# Patient Record
Sex: Male | Born: 1945 | Race: White | Hispanic: No | Marital: Married | Smoking: Former smoker
Health system: Southern US, Community
[De-identification: ages and names within clinical notes are randomized; demographics above are authoritative.]

## PROBLEM LIST (undated history)

## (undated) DIAGNOSIS — I10 Essential (primary) hypertension: Secondary | ICD-10-CM

## (undated) DIAGNOSIS — E785 Hyperlipidemia, unspecified: Secondary | ICD-10-CM

## (undated) DIAGNOSIS — N200 Calculus of kidney: Secondary | ICD-10-CM

## (undated) DIAGNOSIS — G2581 Restless legs syndrome: Secondary | ICD-10-CM

## (undated) DIAGNOSIS — I714 Abdominal aortic aneurysm, without rupture, unspecified: Secondary | ICD-10-CM

## (undated) DIAGNOSIS — K759 Inflammatory liver disease, unspecified: Secondary | ICD-10-CM

## (undated) HISTORY — PX: TONSILLECTOMY: SUR1361

---

## 2002-02-02 ENCOUNTER — Encounter: Payer: Self-pay | Admitting: Family Medicine

## 2002-02-02 ENCOUNTER — Encounter: Admission: RE | Admit: 2002-02-02 | Discharge: 2002-02-02 | Payer: Self-pay | Admitting: Family Medicine

## 2003-12-25 ENCOUNTER — Ambulatory Visit (HOSPITAL_COMMUNITY): Admission: RE | Admit: 2003-12-25 | Discharge: 2003-12-25 | Payer: Self-pay | Admitting: Urology

## 2004-05-15 ENCOUNTER — Ambulatory Visit (HOSPITAL_BASED_OUTPATIENT_CLINIC_OR_DEPARTMENT_OTHER): Admission: RE | Admit: 2004-05-15 | Discharge: 2004-05-15 | Payer: Self-pay | Admitting: Urology

## 2004-05-30 ENCOUNTER — Ambulatory Visit (HOSPITAL_COMMUNITY): Admission: RE | Admit: 2004-05-30 | Discharge: 2004-05-30 | Payer: Self-pay | Admitting: Urology

## 2005-03-19 ENCOUNTER — Encounter: Admission: RE | Admit: 2005-03-19 | Discharge: 2005-03-19 | Payer: Self-pay | Admitting: Family Medicine

## 2005-04-10 ENCOUNTER — Encounter: Admission: RE | Admit: 2005-04-10 | Discharge: 2005-04-10 | Payer: Self-pay | Admitting: Family Medicine

## 2006-07-22 ENCOUNTER — Encounter (INDEPENDENT_AMBULATORY_CARE_PROVIDER_SITE_OTHER): Payer: Self-pay | Admitting: Specialist

## 2006-07-22 ENCOUNTER — Ambulatory Visit (HOSPITAL_BASED_OUTPATIENT_CLINIC_OR_DEPARTMENT_OTHER): Admission: RE | Admit: 2006-07-22 | Discharge: 2006-07-22 | Payer: Self-pay | Admitting: Surgery

## 2007-06-21 ENCOUNTER — Ambulatory Visit (HOSPITAL_COMMUNITY): Admission: RE | Admit: 2007-06-21 | Discharge: 2007-06-21 | Payer: Self-pay | Admitting: Urology

## 2007-07-14 ENCOUNTER — Ambulatory Visit (HOSPITAL_COMMUNITY): Admission: RE | Admit: 2007-07-14 | Discharge: 2007-07-14 | Payer: Self-pay | Admitting: Urology

## 2007-10-04 ENCOUNTER — Inpatient Hospital Stay (HOSPITAL_COMMUNITY): Admission: RE | Admit: 2007-10-04 | Discharge: 2007-10-05 | Payer: Self-pay | Admitting: Urology

## 2008-02-04 ENCOUNTER — Ambulatory Visit (HOSPITAL_COMMUNITY): Admission: RE | Admit: 2008-02-04 | Discharge: 2008-02-04 | Payer: Self-pay | Admitting: Urology

## 2009-04-13 ENCOUNTER — Encounter: Admission: RE | Admit: 2009-04-13 | Discharge: 2009-04-13 | Payer: Self-pay | Admitting: Family Medicine

## 2009-04-16 ENCOUNTER — Encounter: Admission: RE | Admit: 2009-04-16 | Discharge: 2009-04-16 | Payer: Self-pay | Admitting: Family Medicine

## 2009-06-27 ENCOUNTER — Encounter: Admission: RE | Admit: 2009-06-27 | Discharge: 2009-06-27 | Payer: Self-pay | Admitting: Family Medicine

## 2010-11-17 ENCOUNTER — Encounter: Payer: Self-pay | Admitting: Family Medicine

## 2011-02-25 HISTORY — PX: OTHER SURGICAL HISTORY: SHX169

## 2011-03-11 ENCOUNTER — Other Ambulatory Visit (HOSPITAL_COMMUNITY): Payer: Self-pay | Admitting: Orthopaedic Surgery

## 2011-03-11 ENCOUNTER — Encounter (HOSPITAL_COMMUNITY)
Admission: RE | Admit: 2011-03-11 | Discharge: 2011-03-11 | Disposition: A | Discharge status: Home / Self Care | Payer: 59 | Source: Ambulatory Visit | Attending: Orthopaedic Surgery | Admitting: Orthopaedic Surgery

## 2011-03-11 ENCOUNTER — Ambulatory Visit (HOSPITAL_COMMUNITY)
Admission: RE | Admit: 2011-03-11 | Discharge: 2011-03-11 | Disposition: A | Discharge status: Home / Self Care | Payer: 59 | Source: Ambulatory Visit | Attending: Orthopaedic Surgery | Admitting: Orthopaedic Surgery

## 2011-03-11 DIAGNOSIS — M4802 Spinal stenosis, cervical region: Secondary | ICD-10-CM

## 2011-03-11 DIAGNOSIS — Z01818 Encounter for other preprocedural examination: Secondary | ICD-10-CM | POA: Insufficient documentation

## 2011-03-11 DIAGNOSIS — Z01810 Encounter for preprocedural cardiovascular examination: Secondary | ICD-10-CM | POA: Insufficient documentation

## 2011-03-11 DIAGNOSIS — Z01812 Encounter for preprocedural laboratory examination: Secondary | ICD-10-CM | POA: Insufficient documentation

## 2011-03-11 LAB — URINALYSIS, ROUTINE W REFLEX MICROSCOPIC
Bilirubin Urine: NEGATIVE
Ketones, ur: NEGATIVE mg/dL
Nitrite: NEGATIVE
Protein, ur: NEGATIVE mg/dL
Specific Gravity, Urine: 1.009 (ref 1.005–1.030)
Urobilinogen, UA: 0.2 mg/dL (ref 0.0–1.0)
pH: 6.5 (ref 5.0–8.0)

## 2011-03-11 LAB — COMPREHENSIVE METABOLIC PANEL
BUN: 18 mg/dL (ref 6–23)
CO2: 30 mEq/L (ref 19–32)
Chloride: 105 mEq/L (ref 96–112)
GFR calc Af Amer: >60 mL/min (ref >60)
Potassium: 4.6 mEq/L (ref 3.5–5.1)
Sodium: 141 mEq/L (ref 135–145)
Total Protein: 6.3 g/dL (ref 6.0–8.3)

## 2011-03-11 LAB — SURGICAL PCR SCREEN: Staphylococcus aureus: NEGATIVE

## 2011-03-11 LAB — CBC
RBC: 4.44 MIL/uL (ref 4.22–5.81)
WBC: 8.4 10*3/uL (ref 4.0–10.5)

## 2011-03-11 NOTE — Op Note (Signed)
NAMELATRELL, POTEMPA               ACCOUNT NO.:  [id number]   MEDICAL RECORD NO.:  [id number]          PATIENT TYPE:  INP   LOCATION:  0007                         FACILITY:  [facility]   PHYSICIAN:  Heloise Purpura, MD      DATE OF BIRTH:  [birth year]   DATE OF PROCEDURE:  10/04/2007  DATE OF DISCHARGE:                               OPERATIVE REPORT   PREOPERATIVE DIAGNOSIS:  Left ureteral calculus.   POSTOPERATIVE DIAGNOSIS:  Left ureteral calculus.   PROCEDURE:  1. Cystoscopy.  2. Left retrograde pyelography.  3. Left ureteral stent placement (6 x 26)  4. Left robotic assisted laparoscopic ureterolithotomy.   SURGEON:  Heloise Purpura, MD   ASSISTANT:  Dr. Judeen Hammans.   ANESTHESIA:  General.   COMPLICATIONS:  None.   ESTIMATED BLOOD LOSS:  Minimal.   INTRAVENOUS FLUIDS:  2 liters of lactated Ringer's.   SPECIMEN:  Left ureteral calculus.   DISPOSITION OF SPECIMEN:  To [facility] for stone analysis.   INDICATIONS:  [name] is a 65 year old gentleman with a 1 cm left  ureteral calculus that has been refractory to treatment with shock wave  lithotripsy, and endoscopic ureteroscopic laser lithotripsy.  After  discussion regarding options for management of the stone, he did wish to  proceed with a ureterolithotomy, and preferred a minimally invasive  approach if feasible; therefore, the risks, potential complications, and  alternative options of the above procedures were discussed with the  patient in detail; and informed consent was obtained.   DESCRIPTION OF PROCEDURE:  The patient was taken to the operating room  and a general anesthetic was administered.  He was given preoperative  antibiotics, placed in the dorsal lithotomy position, and prepped and  draped in the usual sterile fashion.   Next a preoperative time-out was performed.  Cystourethroscopy was then  performed.  There was no evidence of any bladder tumors, stones, or  other mucosal pathology.  The left  ureteral orifice was identified, and  intubated with a 6-French ureteral catheter.  Contrast was injected  which demonstrated a normal caliber distal ureter.  There was noted to  be a large calcification in the proximal ureter which was easily  identified.  Contrast did easily pass by the stone up into the renal  pelvis, and there were no filling defects seen above the level of the  stone.   A 0.038 sensor guidewire was able to be advanced by the stone, and up  into the renal pelvis without difficulty.  A 6 x 26 double-J ureteral  stent was then advanced over the wire, past the stone without  difficulty, and appropriately positioned under fluoroscopic and  cystoscopic guidance.  The wire was then removed.  The patient's  abdominal wall was marked externally at the level of the stone with the  aid of fluoroscopic guidance.  The cystoscope was then removed and a 16-  [place] Foley catheter was placed.  The patient was then repositioned in  the left modified flank position.  A site was selected just superior to  the umbilicus for placement of the camera port.  This was placed using a  standard open Hassan technique allowing entry into the peritoneal cavity  under direct vision.  A 12 mm port was then placed and the 0-degree lens  was used to inspect the abdomen.  There was no evidence of any intra-  abdominal injuries or other abnormalities.  An 8 mm port was placed in  the left upper quadrant as well as the left lower quadrant for robotic  assistance.  A 12 mm port was placed more superiorly in the midline for  the laparoscopic assistance.   An additional 8 mm robotic port was then placed in the far left lower  quadrant.  The surgical cart was then docked.  With the aid of the  cautery scissors, the white line of Toldt was incised along the length  of the descending colon allowing the colon to be mobilized medially,  then the space between the anterior layer of Gerota's fascia, and the   colonic mesentery to be developed.  The ureter was identified, where it  crossed the iliac vessels and traced superiorly until the level of the  stone.  The ureter was isolated, at this point, with care to preserve  the periureteral blood supply.  An incision over the stone was made  longitudinally on the anterolateral surface of the ureter, and the stone  was identified.   The stone was then removed in its entirety with the stone grasping  forceps and sent for stone analysis.  The ureter was examined, and there  was no evidence of any remaining stone fragments.  The patient's  ureteral stent was identified, and noted to be in place.  A 4-0 Vicryl  running suture was then used to close the ureter in a tension-free  fashion.  A #15 Blake drain was then placed through the left lower  quadrant, robotic port, and secured with a nylon suture.  The surgical  cart was undocked.  The superior 12 mm port was closed with a #0 Vicryl  figure-of-eight suture placed with the aid of the suture passer device.   The camera port site was closed with a running #0 Vicryl suture.  All  ports had been removed under direct vision, and the pneumoperitoneum had  been expelled.  The port sites were then injected with 1/4% Marcaine  reapproximated at the skin level with 4-0 Monocryl in subcuticular  closures.  Steri-Strips were placed, and sterile dressings were applied.  The patient appeared to tolerate the procedure well, and without  complications.  He was able to be extubated and transferred to the  recovery unit in satisfactory condition.      Heloise Purpura, MD  Electronically Signed     LB/MEDQ  D:  10/04/2007  T:  10/04/2007  Job:  [id number]

## 2011-03-11 NOTE — Discharge Summary (Signed)
[name], [name]               ACCOUNT NO.:  [id number]   MEDICAL RECORD NO.:  [id number]          PATIENT TYPE:  INP   LOCATION:  1428                         FACILITY:  [facility]   PHYSICIAN:  Heloise Purpura, MD      DATE OF BIRTH:  12-16-1945   DATE OF ADMISSION:  10/04/2007  DATE OF DISCHARGE:  10/05/2007                               DISCHARGE SUMMARY   ADMISSION DIAGNOSIS:  Left ureteral calculus.   DISCHARGE DIAGNOSIS:  Left ureteral calculus.   HISTORY AND PHYSICAL:  For full details, please see admission History  and Physical.  Briefly, Randall [name] is a 65 year old gentleman with a 1  cm proximal left ureteral calculus that has been refractory to shock  wave lithotripsy an endoscopic treatment with ureteroscopy and laser  lithotripsy..  After discussing options, the patient elected to proceed  with a ureterolithotomy and did elect to have this performed in a  minimally invasive fashion if possible.   HOSPITAL COURSE:  On October 04, 2007, the patient was taken to the  operating room and underwent cystoscopy with ureteral stent placement  and a robotic assisted laparoscopic left ureterolithotomy.  He tolerated  the procedure well without complications and postoperatively was  transferred to a regular hospital room.  He was able to begin ambulating  the night of surgery and remained hemodynamically stable.  His  hemoglobin was found to be stable at 12.7 on postoperative day #1, and  his renal function remained stable with a creatinine of 1.2.  His Foley  catheter was removed, and his drain creatinine was checked and found to  be 1.2 consistent with his serum.  His drain was, therefore, removed.  He was able to tolerate a liquid diet and did have return of bowel  function and, therefore, was discharged home on postoperative day #1 in  excellent condition.   DISPOSITION:  Home.   DISCHARGE MEDICATIONS:  Randall [name] will be given a prescription to take  Vicodin as needed for  pain and has been instructed to use Colace as a  stool softener.   DISCHARGE INSTRUCTIONS:  He was instructed to be ambulatory but  specifically told to refrain from any heavy lifting, strenuous activity,  or driving.  He was given instructions to gradually advance his diet  over the course of the next few days.   FOLLOWUP PLANS:  Randall [name] will follow up in the next couple of weeks  for postoperative evaluation.      Heloise Purpura, MD  Electronically Signed     LB/MEDQ  D:  10/05/2007  T:  10/05/2007  Job:  [id number]

## 2011-03-11 NOTE — H&P (Signed)
Randall [name], Randall [name]               ACCOUNT NO.:  [id number]   MEDICAL RECORD NO.:  [id number]          PATIENT TYPE:  INP   LOCATION:  0007                         FACILITY:  [facility]   PHYSICIAN:  Heloise Purpura, MD      DATE OF BIRTH:  [birth year]   DATE OF ADMISSION:  10/04/2007  DATE OF DISCHARGE:                              HISTORY & PHYSICAL   CHIEF COMPLAINT:  Left ureteral calculus.   HISTORY OF PRESENT ILLNESS:  Randall [name] is a 65 year old gentleman with  a history of nephrolithiasis.  He has been followed by Dr. Vic Blackbird and was found to have a 1 cm proximal ureteral calculus which  has been refractory to shock wave lithotripsy and endoscopic treatment  by Dr. Aldean Ast.  Dr. Aldean Ast recently discussed the option of a  ureterolithotomy for Mr.  [name].  He was interested in undergoing this  definitive procedure.  It was therefore discussed whether this could be  performed to a minimally invasive approach, and it was felt to be  potentially amendable to a laparoscopic procedure.   In addition, Randall [name] has undergone a nuclear medicine renal scan  which did demonstrate 33% relative renal function of the left kidney  versus 67% on the right.  While he did have some delay, there was no  evidence of obstruction, after the administration of Lasix.  He is  currently unstented.  His recent urinalysis is negative for infection.   After discussing management options including an attempt at repeat  endoscopic treatment including a possible percutaneous approach, the  patient does wish to proceed with definitive management and is  interested in a minimally invasive ureterolithotomy.   PAST MEDICAL HISTORY:  Nephrolithiasis.   PAST SURGICAL HISTORY:  1. Left shock wave lithotripsy  2. Left ureteroscopic laser lithotripsy   MEDICATIONS:  Tylenol as needed.   ALLERGIES:  THE PATIENT HAS NO TRUE DRUG ALLERGIES BUT DOES HAVE AN  INTOLERANCE TO CODEINE WHICH CAUSES  NAUSEA.   FAMILY HISTORY:  No history of end-stage renal disease, urolithiasis, or  GU malignancy.   SOCIAL HISTORY:  The patient is married.  He has three children with two  sons and one daughter.  He is self-employed.  He has smoked one pack of  cigarettes for 25 years and drinks one glass of alcohol per day.   REVIEW OF SYSTEMS:  All systems are reviewed and are negative except for  a history of some easy bruising, constipation, dizziness, back pain and  joint pain.   PHYSICAL EXAMINATION:  CONSTITUTIONAL:  Well-nourished, well-developed,  age-appropriate male in no acute distress.  CARDIOVASCULAR:  Regular rate and rhythm without obvious murmurs.  LUNGS:  Clear bilaterally.  ABDOMEN:  Soft, nontender, nondistended without abdominal masses.  BACK:  No CVA tenderness.  EXTREMITIES:  No edema   IMPRESSION:  A 1 cm left proximal ureteral calculus resistant to  endoscopic treatment.   PLAN:  Randall [name] will undergo a left robotic assisted laparoscopic  ureterolithotomy.  He understands that there is the potential for open  surgical conversion.  He will be admitted to  the hospital for  postoperative care and informed consent has been obtained.      Heloise Purpura, MD  Electronically Signed     LB/MEDQ  D:  10/04/2007  T:  10/04/2007  Job:  [id number]

## 2011-03-12 ENCOUNTER — Inpatient Hospital Stay (HOSPITAL_COMMUNITY): Payer: 59

## 2011-03-12 ENCOUNTER — Observation Stay (HOSPITAL_COMMUNITY)
Admission: RE | Admit: 2011-03-12 | Discharge: 2011-03-13 | Disposition: A | Discharge status: Home / Self Care | Payer: 59 | Source: Ambulatory Visit | Attending: Orthopaedic Surgery | Admitting: Orthopaedic Surgery

## 2011-03-12 DIAGNOSIS — Z01818 Encounter for other preprocedural examination: Secondary | ICD-10-CM | POA: Insufficient documentation

## 2011-03-12 DIAGNOSIS — M4712 Other spondylosis with myelopathy, cervical region: Secondary | ICD-10-CM | POA: Insufficient documentation

## 2011-03-12 DIAGNOSIS — M5000 Cervical disc disorder with myelopathy, unspecified cervical region: Principal | ICD-10-CM | POA: Insufficient documentation

## 2011-03-12 DIAGNOSIS — Z01810 Encounter for preprocedural cardiovascular examination: Secondary | ICD-10-CM | POA: Insufficient documentation

## 2011-03-12 DIAGNOSIS — F17200 Nicotine dependence, unspecified, uncomplicated: Secondary | ICD-10-CM | POA: Insufficient documentation

## 2011-03-14 NOTE — Op Note (Signed)
NAME:  Randall [name], Randall [name]                         ACCOUNT NO.:  [id number]   MEDICAL RECORD NO.:  [id number]                   PATIENT TYPE:  AMB   LOCATION:  NESC                                 FACILITY:  [facility]   PHYSICIAN:  Rozanna Boer., M.D.      DATE OF BIRTH:  [birth year]   DATE OF PROCEDURE:  05/15/2004  DATE OF DISCHARGE:                                 OPERATIVE REPORT   PREOPERATIVE DIAGNOSES:  Obstructing distal and proximal ureteral stones.   POSTOPERATIVE DIAGNOSES:  Obstructing distal and proximal ureteral stones.   OPERATION:  Cystoretrograde pyelogram, ureteroscopy with holmium lasertripsy  and insertion of left ureteral stent.   ANESTHESIA:  General.   SURGEON:  Courtney Paris, M.D.   BRIEF HISTORY:  This 65 year old patient presented with a large stone in his  left proximal ureter that was treated with lithotripsy February 2005.  He  never came back and followup and went up to [place] to work.  Coming  back weeks ago, he had some left flank pain that got worse five days ago and  he had some fever three days ago.  He went to the ER where a CT scan showed  an obstruction by a nest of proximal ureteral stones and also a large stone  in the distal ureter between the junction of the middle and lower third. The  distal stone was about 8-10 mm in size.  There was a smaller stone in the  lower left kidney but it was just a fraction of the size that it was before  the lithotripsy in February.  He enters now for Cysto stent insertion and  possible ureteroscopy.   The patient was placed on the operating table in the dorsal lithotomy  position after satisfactory induction of general anesthesia. He was given  Cipro yesterday and again IV prior to the procedure.  The panendoscope was  inserted, his meatus was somewhat tight and he had no anterior strictures,  plus the urethra was nonobstructing. The bladder was entered. The bladder  had a few patchy areas  of erythema but no evidence of any tumors or other  mucosal lesions seen. The left orifice was catheterized with a 6 open ended  ureteral catheter and then an occlusive retrograde demonstrated the  obstructing stone in the distal left ureter and looks like stones in the  dilated upper ureter as well.  A guidewire was then passed beyond the stone  up to the level of the kidney.  The distal looked like it was accessible by  ureteroscopy so a 10 cm ureteral balloon dilator was then passed over the  guidewire and inflated to 12 atmospheres for 4 timed minutes. After this,  the short 6 ureteroscope was passed up to the stone where it was  photographed but it looked like it was too big to get a basket on it.  I  used the 325 micron holmium laser fibers and passed this  up along the  ureteroscope and under direct vision was able to break the stone. It had a  very hard outer cord but broke the stone into about three fairly large  pieces. I then get the scope beyond the stone.  I then passed a four wire  basket several times and removed all the pieces. I was able to get up to the  stones in the proximal ureter but I was at the extent of the ureteroscope  and I was afraid to try to basket or treat at this level. So I backed out  the ureteroscope and passed a 6 x 24 cm length double J ureteral stent over  the guidewire which was then removed with the coil in the renal pelvis and  also in the bladder in a good fashion. The bladder was drained, scope  removed and the patient taken  to the recovery room in good condition to be later discharged as an  outpatient.  We will see him next week, do a KUB and see if the stones in  the upper ureter are accessible by lithotripsy in which case they will be  treated at that time. The stones were sent for analysis.                                               Rozanna Boer., M.D.    HMK/MEDQ  D:  05/15/2004  T:  05/15/2004  Job:  [id number]

## 2011-03-27 NOTE — Op Note (Signed)
NAMEJASIAH, ELSEN               ACCOUNT NO.:  [id number]  MEDICAL RECORD NO.:  [id number]           PATIENT TYPE:  I  LOCATION:  5021                         FACILITY:  [facility]  PHYSICIAN:  Alayja Armas C. Ophelia Charter, M.D.    DATE OF BIRTH:  May 19, 1946  DATE OF PROCEDURE:  03/12/2011 DATE OF DISCHARGE:                              OPERATIVE REPORT   PREOPERATIVE DIAGNOSES:  Cervical spondylosis with herniated nucleus pulposus, severe stenosis, cord myelomalacia, and myelopathy.  PROCEDURES:  C4-C5 anterior cervical diskectomy and fusion and allograft.  C6 corpectomy.  C5-C6, C6-C7 diskectomy, decompression, herniated nucleus pulposus removal.  Tricortical allograft C5-C7 and anterior cervical plating from C4-C7.  Allograft bone, local autologous bone grafting from the C6 corpectomy.  (Three level fusion with C6 corpectomy.)  SURGEON:  Brenlynn Fake C. Ophelia Charter, MD  ASSISTANT:  Maud Deed, PA-C, medically necessary and present for the entire procedure. ESTIMATED BLOOD LOSS:  150 mL.  DRAINS:  One Hemovac, neck Aspen collar postop.  INDICATIONS:  This is a 65 year old male who has had several year history of progressive increased difficulty walking with back, knee, quad weakness, hip flexion weakness, and great difficulty climbing stairs.  There was triceps weakness and great quad weakness and MRI scan showed large HNP with 4-5 mm canal space with the left paracentral HNP at C5-C6 which had migrated back behind C6 vertebral body and a C6-C7 HNP with narrowing down to 6-7 mm with disk fragment migrating cephalad behind the C6 body where disk was basically present along the entire posterior aspect of C6.  C4-C5 had moderate spondylosis with uncovertebral spurring and moderate central narrowing.  PROCEDURE IN DETAIL:  After induction of general anesthesia and orotracheal intubation, the patient had preoperative Ancef prophylaxis and head halter traction was applied.  No traction was used.  Taping  of 4 x 4s over the ears, prepping with DuraPrep, the area squared with towels, sterile skin marker, Betadine Steri-Strips, sterile Mayo stand at the head, thyroid sheet and drapes were applied.  Time-out procedure was completed.  Warmer was on, leg pumpers for DVT prophylaxis. Informed consent had been obtained including the potential for increasing neurologic deficit due to the cord myelomalacia changes and already present motor deficit.  Incision was made transversely overlying the C5-C6 disk using a prominent skin fold and palpable landmarks, cricothyroid, carotid tubercle, and clavicle.  Platysma was divided in line with the fibers. Omohyoid was spared and dissection began first underneath the omohyoid and large spur was identified.  A #25 short needle placed with a straight clamp in the C5-C6 disk space and identified with cross-table lateral C-arm spot picture, correctly identifying the level.  Self- retaining Cloward retractors, teeth blades, right and left, smooth blades up and down, diskectomy was performed.  With #15 blade pituitaries and Cloward curettes, uncovertebral joints were stripped. Once the endplates were stripped, portion of the vertebrae resected with a Leksell rongeur and saved for later bone graft.  Operative microscope was draped and brought in, 3 mm of top portion of C6 was burred away all the way down to the posterior cortex and then microdissection techniques were used for  taking down the extremely thick posterior longitudinal ligament with the bone and disk material present.  Using some thrombin- soaked Gelfoam and FloSeal generic equivalent which was in a tube was used intermittently for hemostasis.  No epidural veins could be coagulated and once the bone was removed, the rest of the C6 was taken after doing microdiskectomy at C6-C7, stripping the uncovertebral joints.  Trials which were 12-13 mm were checked and continued bone removal until bone width was  more than 15 mm and the walls were smooth. Some chunks of the midportion of the cancellous bone of C6 were saved for later bone grafting and the endplate at C7 was burred and scraped. The disk fragments and posterior longitudinal ligament was scarred down to the dura on the left side and a black nerve hook was used for gentle blunt dissection freeing this up and then removing chunks of disk and ligament which was scarred down.  Dura was completely decompressed and rounded up into a nice tube.  Measurement was made with a ruler and then tricortical allograft piece was obtained that had been soaked for an hour.  With rehydration, it was cut with the oscillating saw, trimmed as needed with the bur and hand rongeur, and then custom fitted. Distraction was applied by the CRNA as this was gently tapped in place. It had to be adjusted when the top piece was tapped in at the cephalad portion.  The inferior portion of the graft wanted to the fall too far posteriorly and black nerve hook was used to pull it up as the top was gently tapped and fit securely.  There was no angulation of the graft. It was sitting in good position and was tight.  Next, blunt dissection above the omohyoid was performed.  Omohyoid was saved and no diskectomy was performed at C4-C5 after the Cloward self-retaining retractors were removed.  At this level, a 6-mm graft was planned.  The remaining portion of the allograft was cut, however, the bone was somewhat crumbly, did not maintain its shape as well and 3 separate pieces were cut each time it was being trimmed, some would tend to crack.  Cortical cancellous allograft was separately opened, 6-mm lordotic and then packed it into place after endplates had been curetted and the posterior longitudinal ligament had been completely been taken down for visualization of the dura at C4-C5 level.  There was less severe spurring and minimal disk protrusion compared to the other  levels.  This level only had some moderate stenosis.  There was room on each side for egress of bone.  The remaining bone was packed at the top and the bottom portion of the graft, some was tucked in underneath the plate and some was placed in the uncovertebral gutters but making sure that did not fall down posteriorly.  Pieces that were stuck in there were tight. Operative field was dry.  A Hemovac was placed with an in-and-out technique in-line with the skin incision.  Platysma closed with 3-0 Vicryl.  Omohyoid was normal.  4-0 Vicryl was used for subcuticular closure.  Tincture of benzoin, Steri-Strips, Marcaine infiltration, postop dressing, tape, and an Aspen collar.  Instrument count and needle count was correct.  Time-out procedure was completed at the end the case.  The patient was able to plantar flex and grip with both hands but was not awake and participated in exam until he was in the PACU recovery room.  He still had slight triceps weakness, slightly more on the  right than left but had good plantarflexion, good dorsiflexion, and could do a straight leg raise on both right and left feet.  No clonus was present, sensation was intact, and the patient in the PACU had no change in his neurologic exam from preoperative status.    Ramina Hulet C. Ophelia Charter, M.D.     MCY/MEDQ  D:  03/12/2011  T:  03/13/2011  Job:  [id number]  Electronically Signed by Annell Greening M.D. on 03/27/2011 06:28:06 PM

## 2011-03-28 ENCOUNTER — Other Ambulatory Visit: Payer: Self-pay | Admitting: Family Medicine

## 2011-03-28 DIAGNOSIS — R0989 Other specified symptoms and signs involving the circulatory and respiratory systems: Secondary | ICD-10-CM

## 2011-04-01 ENCOUNTER — Ambulatory Visit
Admission: RE | Admit: 2011-04-01 | Discharge: 2011-04-01 | Disposition: A | Discharge status: Home / Self Care | Payer: 59 | Source: Ambulatory Visit | Attending: Family Medicine | Admitting: Family Medicine

## 2011-04-01 DIAGNOSIS — R0989 Other specified symptoms and signs involving the circulatory and respiratory systems: Secondary | ICD-10-CM

## 2011-04-10 ENCOUNTER — Ambulatory Visit (HOSPITAL_BASED_OUTPATIENT_CLINIC_OR_DEPARTMENT_OTHER)
Admission: RE | Admit: 2011-04-10 | Discharge: 2011-04-10 | Disposition: A | Discharge status: Home / Self Care | Payer: 59 | Source: Ambulatory Visit | Attending: Orthopedic Surgery | Admitting: Orthopedic Surgery

## 2011-04-10 ENCOUNTER — Other Ambulatory Visit: Payer: Self-pay | Admitting: Orthopedic Surgery

## 2011-04-10 DIAGNOSIS — Z01812 Encounter for preprocedural laboratory examination: Secondary | ICD-10-CM | POA: Insufficient documentation

## 2011-04-10 DIAGNOSIS — L723 Sebaceous cyst: Secondary | ICD-10-CM | POA: Insufficient documentation

## 2011-04-15 NOTE — Op Note (Signed)
Randall [name], Randall [name]               ACCOUNT NO.:  [id number]  MEDICAL RECORD NO.:  [id number]  LOCATION:                                 FACILITY:  PHYSICIAN:  Katy Fitch. Ahleah Simko, M.D. DATE OF BIRTH:  April 08, 1946  DATE OF PROCEDURE:  04/10/2011 DATE OF DISCHARGE:                              OPERATIVE REPORT   PREOPERATIVE DIAGNOSIS:  Enlarging mass lateral aspect left proximal forearm adherent to the fascia of the extensor muscles.  POSTOPERATIVE DIAGNOSIS:  Probable epidermal inclusion cyst.  OPERATION:  Resection of subdermal (adherent to the fascia) mass left lateral forearm.  SURGEON:  Katy Fitch. Fraida Veldman, MD  ASSISTANT:  Annye Rusk, PA-C  ANESTHESIA:  General by LMA  SUPERVISING ANESTHESIOLOGIST:  Janetta Hora. Gelene Mink, MD  INDICATIONS:  Randall [name] is a 65 year old gentleman referred through the courtesy of Dr. Catha Gosselin of [facility].  Randall [name] had noted a gradually enlarging mass on the lateral aspect of his proximal left forearm.  This was nontender.  Clinical examination suggested an epidermal inclusion cyst.  He did not have a specific history of a puncture wound in this region that he could recall.  We advised him to proceed with excisional biopsy of this mass at this time for diagnosis with hopeful resolution of this predicament.  After informed consent, he is brought to the operating room at this time.  Preoperatively, he was interviewed by Dr. Gelene Mink.  Dr. Gelene Mink recommend general anesthesia by LMA.  Questions were invited and answered in detail.  PROCEDURE:  Randall [name] is brought to room 6 of the [facility] and placed in supine position upon the operating table.  Following the induction of general anesthesia by LMA technique under Dr. Thornton Dales direct supervision, the left arm was prepped with Betadine soap solution, sterilely draped.  A pneumatic tourniquet was applied to the proximal left  brachium.  Following a routine surgical time-out, the left arm was exsanguinated with an Esmarch bandage and the arterial tourniquet inflated to 220 mmHg.  Procedure commenced with a longitudinal incision directly over the mass. Subcutaneous tissues were meticulously divided taking care not to rupture the mass wall.  This was circumferentially dissected and electrocautery was used to obtain hemostasis from vessels that were adherent or passing through the mass.  With skin traction, a small portion of the cyst ruptured, however, no epidermal contents soaked the wound and there was no evidence of any epidermal cyst wall remaining.  After the mass was circumferentially excised, it was passed off in formalin for pathologic evaluation.  The wound was then thoroughly irrigated with sterile saline and a micro rongeur was used to clean the deep surface of the dermis in an effort to prevent recurrence.  The wound was then repaired in layers with subcutaneous 4-0 Vicryl and intradermal 3-0 Prolene with Steri-Strips and sterile dressing applied,sterile gauze and Tegaderm followed by an Ace wrap cover.  Randall [name] was advised to shower with the Ace bandage off and the Tegaderm in place.  We will see him back for followup in 1 week for suture removal and discussion of his pathologic results.  His wound was anesthetized with 2% lidocaine for postoperative analgesia.  Katy Fitch Nyoka Alcoser, M.D.     RVS/MEDQ  D:  04/10/2011  T:  04/10/2011  Job:  [id number]  cc:   Caryn Bee L. Little, M.D.  Electronically Signed by Josephine Igo M.D. on 04/15/2011 08:18:06 AM

## 2011-08-04 LAB — BASIC METABOLIC PANEL
BUN: 14
Calcium: 8.3 — ABNORMAL LOW
Calcium: 9.5
Chloride: 106
GFR calc Af Amer: >60
GFR calc Af Amer: >60
GFR calc non Af Amer: >60
GFR calc non Af Amer: >60
Glucose, Bld: 120 — ABNORMAL HIGH
Potassium: 4.1
Potassium: 4.4
Sodium: 139
Sodium: 139
Sodium: 141

## 2011-08-04 LAB — CREATININE, FLUID (PLEURAL, PERITONEAL, JP DRAINAGE): Creat, Fluid: 1.2

## 2011-08-04 LAB — URINALYSIS, ROUTINE W REFLEX MICROSCOPIC
Glucose, UA: NEGATIVE
Hgb urine dipstick: NEGATIVE
Protein, ur: NEGATIVE
pH: 6

## 2011-08-04 LAB — HEMOGLOBIN AND HEMATOCRIT, BLOOD
HCT: 37.2 — ABNORMAL LOW
HCT: 41.7
Hemoglobin: 12.7 — ABNORMAL LOW
Hemoglobin: 14.7

## 2011-08-04 LAB — TYPE AND SCREEN: Antibody Screen: NEGATIVE

## 2011-08-04 LAB — ABO/RH: ABO/RH(D): A POS

## 2011-08-04 LAB — URINE CULTURE
Colony Count: NO GROWTH
Special Requests: NEGATIVE

## 2011-08-04 LAB — CBC
Hemoglobin: 15.6
RBC: 4.65
RDW: 13.2

## 2011-08-07 LAB — URINALYSIS, ROUTINE W REFLEX MICROSCOPIC
Bilirubin Urine: NEGATIVE
Ketones, ur: NEGATIVE
Nitrite: NEGATIVE
Specific Gravity, Urine: 1.009
Urobilinogen, UA: 0.2
pH: 6.5

## 2011-08-07 LAB — CBC
MCHC: 34.7
Platelets: 283
RDW: 13.2

## 2011-08-07 LAB — BASIC METABOLIC PANEL
Calcium: 9.4
GFR calc non Af Amer: >60
Potassium: 4.9
Sodium: 141

## 2011-09-30 ENCOUNTER — Other Ambulatory Visit: Payer: Self-pay | Admitting: Orthopaedic Surgery

## 2011-09-30 ENCOUNTER — Ambulatory Visit
Admission: RE | Admit: 2011-09-30 | Discharge: 2011-09-30 | Disposition: A | Discharge status: Home / Self Care | Payer: 59 | Source: Ambulatory Visit | Attending: Orthopaedic Surgery | Admitting: Orthopaedic Surgery

## 2011-09-30 DIAGNOSIS — M542 Cervicalgia: Secondary | ICD-10-CM

## 2011-12-08 ENCOUNTER — Encounter (HOSPITAL_COMMUNITY): Payer: Self-pay | Admitting: Pharmacy Technician

## 2011-12-12 ENCOUNTER — Other Ambulatory Visit (HOSPITAL_COMMUNITY): Payer: Self-pay | Admitting: *Deleted

## 2011-12-12 ENCOUNTER — Encounter (HOSPITAL_COMMUNITY)
Admission: RE | Admit: 2011-12-12 | Discharge: 2011-12-12 | Disposition: A | Discharge status: Home / Self Care | Payer: BC Managed Care – PPO | Source: Ambulatory Visit | Attending: Orthopaedic Surgery | Admitting: Orthopaedic Surgery

## 2011-12-12 ENCOUNTER — Encounter (HOSPITAL_COMMUNITY): Payer: Self-pay

## 2011-12-12 ENCOUNTER — Other Ambulatory Visit (HOSPITAL_COMMUNITY): Payer: Self-pay | Admitting: Orthopaedic Surgery

## 2011-12-12 HISTORY — DX: Calculus of kidney: N20.0

## 2011-12-12 LAB — CBC
Hemoglobin: 14.7 g/dL (ref 13.0–17.0)
MCH: 33.3 pg (ref 26.0–34.0)
MCHC: 35.3 g/dL (ref 30.0–36.0)
Platelets: 277 10*3/uL (ref 150–400)
RBC: 4.42 MIL/uL (ref 4.22–5.81)

## 2011-12-12 LAB — SURGICAL PCR SCREEN
MRSA, PCR: NEGATIVE
Staphylococcus aureus: NEGATIVE

## 2011-12-12 NOTE — Pre-Procedure Instructions (Signed)
20 BURT PIATEK  12/12/2011   Your procedure is scheduled on:  Friday, February 22nd.  Report to [facility] at 5:30 AM.   Call this number if you have problems the morning of surgery: [phone]   Remember:   Do not eat food:After Midnight.  May have clear liquids: up to 4 Hours before arrival.  Clear liquids include soda, tea, black coffee, apple or grape juice, broth.  Take these medicines the morning of surgery with A SIP OF WATER: NA   Do not wear jewelry, make-up or nail polish.  Do not wear lotions, powders, or perfumes. You may wear deodorant.  Do not shave 48 hours prior to surgery.  Do not bring valuables to the hospital.  Contacts, dentures or bridgework may not be worn into surgery.  Leave suitcase in the car. After surgery it may be brought to your room.  For patients admitted to the hospital, checkout time is 11:00 AM the day of discharge.   Patients discharged the day of surgery will not be allowed to drive home.  Name and phone number of your driver: ---  Special Instructions: CHG Shower Use Special Wash: 1/2 bottle night before surgery and 1/2 bottle morning of surgery.   Please read over the following fact sheets that you were given: Pain Booklet, Coughing and Deep Breathing, MRSA Information and Surgical Site Infection Prevention

## 2011-12-18 MED ORDER — CEFAZOLIN SODIUM 1-5 GM-% IV SOLN
1.0000 g | INTRAVENOUS | Status: AC
  Administered 2011-12-19: 1 g via INTRAVENOUS
  Filled 2011-12-18: qty 50

## 2011-12-18 NOTE — H&P (Addendum)
  [facility]  A Division of [employer]  [address] [place], [place] [address] Telephone: [phone]  Fax: [phone]    PATIENT: Randall [name], Randall [name]  MR#: [id number]  Visit Date: 12/18/2011   Chief complaint: neck pain , pseudoarthrosis after 3 level ACDF by CT scan with motion Patient returns, he had previous 3-level cervical procedure 03/12/2011 with progressive myelopathy with quad weakness, hip flexor weakness, and inability to climb stairs.  His symptoms have been present for several years, gradually progressed.  Three-level procedure was performed.  Upper level appeared healed.  There is loosening at the lower level and CT scan was recommended.  We had discussed with him that with 3-level cervical fusion, as well as with the corpectomy, and long fibular set graft that at times the long grafts are more likely not to heal and a 2nd procedure may be necessary.  Plate was in good position, screws look good and on serial x-rays over the last few months there has been some reabsorption at the inferior aspect of the graft vertebral body junction.  This is consistent with likely pseudoarthrosis and patient has obtained a CT scan for review.     Family history, social history, and 14-point review of systems updated and unchanged.      PHYSICAL EXAMINATION:  He has had no change in his neurologic exam, has had improvement in his gait, improvement of lower extremities, and has been able to walk up stairs, and has improvement in quad strength, has not fallen since this procedure with gradual improvement in his lower extremity strength. Lucency at the C7 graft junction and some evidence of some slight loosening around the screws were reasons for obtaining CT scan. Good decompression at the planned levels. There is some evidence of loosening of C7 screws with pseudoarthrosis at the inferior aspect of the graft.    PLAN:  Posterior cervical fusion 3 level, the  C4-5 level is progressing, but is not completely healed, and plan will be posterior cervical fusion with iliac crest aspirate and Vitoss, posterior interspinous wiring.  Procedure was discussed, this was outlined well to him before the original procedure.  On flexion and extension there is motion at the bottom level where he has the loosening of the screws and he understands that with the long cervical anterior fusion at times that posterior cervical fusion is required if the anterior procedure does not heal.  All questions answered, he understands, and requests to proceed.     For additional information please see handwritten notes, reports, orders and prescriptions in this chart.      Sammantha Mehlhaff C. Ophelia Charter, M.D.    Auto-Authenticated by Veverly Fells. Ophelia Charter, M.D.  MCY/lp/js DD: 12/18/11 DT: 1

## 2011-12-19 ENCOUNTER — Ambulatory Visit (HOSPITAL_COMMUNITY)
Admission: RE | Admit: 2011-12-19 | Discharge: 2011-12-20 | Disposition: A | Discharge status: Home / Self Care | Payer: BC Managed Care – PPO | Source: Ambulatory Visit | Attending: Orthopaedic Surgery | Admitting: Orthopaedic Surgery

## 2011-12-19 ENCOUNTER — Encounter (HOSPITAL_COMMUNITY): Payer: Self-pay

## 2011-12-19 ENCOUNTER — Encounter (HOSPITAL_COMMUNITY)
Admission: RE | Disposition: A | Discharge status: Home / Self Care | Payer: Self-pay | Source: Ambulatory Visit | Attending: Orthopaedic Surgery

## 2011-12-19 ENCOUNTER — Ambulatory Visit (HOSPITAL_COMMUNITY): Payer: BC Managed Care – PPO | Admitting: Anesthesiology

## 2011-12-19 ENCOUNTER — Ambulatory Visit (HOSPITAL_COMMUNITY): Payer: BC Managed Care – PPO

## 2011-12-19 ENCOUNTER — Encounter (HOSPITAL_COMMUNITY): Payer: Self-pay | Admitting: Anesthesiology

## 2011-12-19 DIAGNOSIS — Y831 Surgical operation with implant of artificial internal device as the cause of abnormal reaction of the patient, or of later complication, without mention of misadventure at the time of the procedure: Secondary | ICD-10-CM | POA: Insufficient documentation

## 2011-12-19 DIAGNOSIS — F17200 Nicotine dependence, unspecified, uncomplicated: Secondary | ICD-10-CM | POA: Insufficient documentation

## 2011-12-19 DIAGNOSIS — Z981 Arthrodesis status: Secondary | ICD-10-CM

## 2011-12-19 DIAGNOSIS — T84498A Other mechanical complication of other internal orthopedic devices, implants and grafts, initial encounter: Secondary | ICD-10-CM | POA: Insufficient documentation

## 2011-12-19 DIAGNOSIS — M960 Pseudarthrosis after fusion or arthrodesis: Secondary | ICD-10-CM

## 2011-12-19 DIAGNOSIS — Z01812 Encounter for preprocedural laboratory examination: Secondary | ICD-10-CM | POA: Insufficient documentation

## 2011-12-19 HISTORY — PX: POSTERIOR CERVICAL FUSION/FORAMINOTOMY: SHX5038

## 2011-12-19 LAB — COMPREHENSIVE METABOLIC PANEL
Albumin: 3.8 g/dL (ref 3.5–5.2)
Alkaline Phosphatase: 78 U/L (ref 39–117)
BUN: 25 mg/dL — ABNORMAL HIGH (ref 6–23)
Calcium: 9.8 mg/dL (ref 8.4–10.5)
Creatinine, Ser: 1.02 mg/dL (ref 0.50–1.35)
GFR calc Af Amer: 88 mL/min — ABNORMAL LOW (ref >90)
Glucose, Bld: 109 mg/dL — ABNORMAL HIGH (ref 70–99)
Total Protein: 6.5 g/dL (ref 6.0–8.3)

## 2011-12-19 SURGERY — POSTERIOR CERVICAL FUSION/FORAMINOTOMY LEVEL 2
Anesthesia: General | Site: Neck | Wound class: Clean

## 2011-12-19 MED ORDER — OXYCODONE-ACETAMINOPHEN 5-325 MG PO TABS: ORAL_TABLET | ORAL | Status: DC

## 2011-12-19 MED ORDER — HEMOSTATIC AGENTS (NO CHARGE) OPTIME
TOPICAL | Status: DC | PRN
  Administered 2011-12-19: 1 via TOPICAL

## 2011-12-19 MED ORDER — METHOCARBAMOL 500 MG PO TABS: 500.0000 mg | ORAL_TABLET | Freq: Four times a day (QID) | ORAL | Status: AC

## 2011-12-19 MED ORDER — ALUM & MAG HYDROXIDE-SIMETH 200-200-20 MG/5ML PO SUSP: 30.0000 mL | Freq: Four times a day (QID) | ORAL | Status: DC | PRN

## 2011-12-19 MED ORDER — METHOCARBAMOL 500 MG PO TABS
500.0000 mg | ORAL_TABLET | Freq: Four times a day (QID) | ORAL | Status: DC | PRN
  Administered 2011-12-19: 500 mg via ORAL
  Filled 2011-12-19: qty 1

## 2011-12-19 MED ORDER — CEFAZOLIN SODIUM 1-5 GM-% IV SOLN
1.0000 g | Freq: Three times a day (TID) | INTRAVENOUS | Status: AC
  Administered 2011-12-19 (×2): 1 g via INTRAVENOUS
  Filled 2011-12-19 (×3): qty 50

## 2011-12-19 MED ORDER — HYDROCODONE-ACETAMINOPHEN 5-325 MG PO TABS
1.0000 | ORAL_TABLET | ORAL | Status: DC | PRN
  Administered 2011-12-19 – 2011-12-20 (×2): 2 via ORAL
  Filled 2011-12-19 (×2): qty 2

## 2011-12-19 MED ORDER — METHOCARBAMOL 100 MG/ML IJ SOLN
500.0000 mg | Freq: Four times a day (QID) | INTRAVENOUS | Status: DC | PRN
  Filled 2011-12-19: qty 5

## 2011-12-19 MED ORDER — HYDROMORPHONE HCL PF 1 MG/ML IJ SOLN
INTRAMUSCULAR | Status: AC
  Filled 2011-12-19: qty 1

## 2011-12-19 MED ORDER — LACTATED RINGERS IV SOLN
INTRAVENOUS | Status: DC | PRN
  Administered 2011-12-19 (×2): via INTRAVENOUS

## 2011-12-19 MED ORDER — BUPIVACAINE-EPINEPHRINE 0.5% -1:200000 IJ SOLN
INTRAMUSCULAR | Status: DC | PRN
  Administered 2011-12-19: 10 mL

## 2011-12-19 MED ORDER — ONDANSETRON HCL 4 MG/2ML IJ SOLN: 4.0000 mg | INTRAMUSCULAR | Status: DC | PRN

## 2011-12-19 MED ORDER — BISACODYL 10 MG RE SUPP: 10.0000 mg | Freq: Every day | RECTAL | Status: DC | PRN

## 2011-12-19 MED ORDER — GLYCOPYRROLATE 0.2 MG/ML IJ SOLN
INTRAMUSCULAR | Status: DC | PRN
  Administered 2011-12-19: .7 mg via INTRAVENOUS

## 2011-12-19 MED ORDER — FENTANYL CITRATE 0.05 MG/ML IJ SOLN
INTRAMUSCULAR | Status: DC | PRN
  Administered 2011-12-19 (×2): 100 ug via INTRAVENOUS
  Administered 2011-12-19: 50 ug via INTRAVENOUS

## 2011-12-19 MED ORDER — ONDANSETRON HCL 4 MG/2ML IJ SOLN: 4.0000 mg | Freq: Once | INTRAMUSCULAR | Status: DC | PRN

## 2011-12-19 MED ORDER — SODIUM CHLORIDE 0.9 % IJ SOLN: 3.0000 mL | Freq: Two times a day (BID) | INTRAMUSCULAR | Status: DC

## 2011-12-19 MED ORDER — KCL IN DEXTROSE-NACL 20-5-0.45 MEQ/L-%-% IV SOLN
INTRAVENOUS | Status: DC
  Administered 2011-12-19: 14:00:00 via INTRAVENOUS
  Filled 2011-12-19 (×3): qty 1000

## 2011-12-19 MED ORDER — ROCURONIUM BROMIDE 100 MG/10ML IV SOLN
INTRAVENOUS | Status: DC | PRN
  Administered 2011-12-19: 50 mg via INTRAVENOUS
  Administered 2011-12-19: 10 mg via INTRAVENOUS

## 2011-12-19 MED ORDER — ACETAMINOPHEN 650 MG RE SUPP: 650.0000 mg | RECTAL | Status: DC | PRN

## 2011-12-19 MED ORDER — HYDROMORPHONE HCL PF 1 MG/ML IJ SOLN
0.2500 mg | INTRAMUSCULAR | Status: DC | PRN
  Administered 2011-12-19 (×4): 0.5 mg via INTRAVENOUS

## 2011-12-19 MED ORDER — ACETAMINOPHEN 325 MG PO TABS: 650.0000 mg | ORAL_TABLET | ORAL | Status: DC | PRN

## 2011-12-19 MED ORDER — PANTOPRAZOLE SODIUM 40 MG IV SOLR
40.0000 mg | Freq: Every day | INTRAVENOUS | Status: DC
  Administered 2011-12-19: 40 mg via INTRAVENOUS
  Filled 2011-12-19 (×2): qty 40

## 2011-12-19 MED ORDER — PHENOL 1.4 % MT LIQD
1.0000 | OROMUCOSAL | Status: DC | PRN
  Filled 2011-12-19: qty 177

## 2011-12-19 MED ORDER — MENTHOL 3 MG MT LOZG: 1.0000 | LOZENGE | OROMUCOSAL | Status: DC | PRN

## 2011-12-19 MED ORDER — MORPHINE SULFATE 2 MG/ML IJ SOLN: 0.0500 mg/kg | INTRAMUSCULAR | Status: DC | PRN

## 2011-12-19 MED ORDER — LIDOCAINE HCL (CARDIAC) 20 MG/ML IV SOLN: INTRAVENOUS | Status: DC | PRN

## 2011-12-19 MED ORDER — SODIUM CHLORIDE 0.9 % IJ SOLN: 3.0000 mL | INTRAMUSCULAR | Status: DC | PRN

## 2011-12-19 MED ORDER — PROPOFOL 10 MG/ML IV EMUL
INTRAVENOUS | Status: DC | PRN
  Administered 2011-12-19: 120 mg via INTRAVENOUS

## 2011-12-19 MED ORDER — DOCUSATE SODIUM 100 MG PO CAPS
100.0000 mg | ORAL_CAPSULE | Freq: Two times a day (BID) | ORAL | Status: DC
  Administered 2011-12-19 (×2): 100 mg via ORAL
  Filled 2011-12-19 (×4): qty 1

## 2011-12-19 MED ORDER — EPHEDRINE SULFATE 50 MG/ML IJ SOLN
INTRAMUSCULAR | Status: DC | PRN
  Administered 2011-12-19 (×2): 10 mg via INTRAVENOUS

## 2011-12-19 MED ORDER — MIDAZOLAM HCL 5 MG/5ML IJ SOLN
INTRAMUSCULAR | Status: DC | PRN
  Administered 2011-12-19: 2 mg via INTRAVENOUS

## 2011-12-19 MED ORDER — KETOROLAC TROMETHAMINE 30 MG/ML IJ SOLN
30.0000 mg | Freq: Once | INTRAMUSCULAR | Status: AC
  Administered 2011-12-19: 30 mg via INTRAVENOUS

## 2011-12-19 MED ORDER — HYDROMORPHONE HCL PF 1 MG/ML IJ SOLN
0.5000 mg | INTRAMUSCULAR | Status: DC | PRN
  Administered 2011-12-19 (×3): 1 mg via INTRAVENOUS
  Filled 2011-12-19 (×2): qty 1

## 2011-12-19 MED ORDER — OXYCODONE-ACETAMINOPHEN 5-325 MG PO TABS: 1.0000 | ORAL_TABLET | ORAL | Status: DC | PRN

## 2011-12-19 MED ORDER — SENNOSIDES-DOCUSATE SODIUM 8.6-50 MG PO TABS: 1.0000 | ORAL_TABLET | Freq: Every evening | ORAL | Status: DC | PRN

## 2011-12-19 MED ORDER — ONDANSETRON HCL 4 MG/2ML IJ SOLN
INTRAMUSCULAR | Status: DC | PRN
  Administered 2011-12-19 (×2): 4 mg via INTRAVENOUS

## 2011-12-19 MED ORDER — ASPIRIN EC 325 MG PO TBEC
325.0000 mg | DELAYED_RELEASE_TABLET | Freq: Every day | ORAL | Status: DC
  Filled 2011-12-19 (×2): qty 1

## 2011-12-19 MED ORDER — 0.9 % SODIUM CHLORIDE (POUR BTL) OPTIME
TOPICAL | Status: DC | PRN
  Administered 2011-12-19: 1000 mL

## 2011-12-19 MED ORDER — SODIUM CHLORIDE 0.9 % IV SOLN: 250.0000 mL | INTRAVENOUS | Status: DC

## 2011-12-19 MED ORDER — MEPERIDINE HCL 25 MG/ML IJ SOLN: 6.2500 mg | INTRAMUSCULAR | Status: DC | PRN

## 2011-12-19 MED ORDER — NEOSTIGMINE METHYLSULFATE 1 MG/ML IJ SOLN
INTRAMUSCULAR | Status: DC | PRN
  Administered 2011-12-19: 4 mg via INTRAVENOUS

## 2011-12-19 MED ORDER — ZOLPIDEM TARTRATE 10 MG PO TABS: 10.0000 mg | ORAL_TABLET | Freq: Every evening | ORAL | Status: DC | PRN

## 2011-12-19 SURGICAL SUPPLY — 57 items
24G wire ×2 IMPLANT
BENZOIN TINCTURE PRP APPL 2/3 (GAUZE/BANDAGES/DRESSINGS) IMPLANT
BLADE SURG 15 STRL LF DISP TIS (BLADE) ×1 IMPLANT
BLADE SURG 15 STRL SS (BLADE) ×1
BLADE SURG ROTATE 9660 (MISCELLANEOUS) IMPLANT
BUR ROUND FLUTED 4 SOFT TCH (BURR) ×2 IMPLANT
CLOTH BEACON ORANGE TIMEOUT ST (SAFETY) ×2 IMPLANT
CORDS BIPOLAR (ELECTRODE) ×2 IMPLANT
COVER SURGICAL LIGHT HANDLE (MISCELLANEOUS) ×2 IMPLANT
DRAPE PROXIMA HALF (DRAPES) ×2 IMPLANT
DRAPE SURG 17X23 STRL (DRAPES) ×10 IMPLANT
DRSG MEPILEX BORDER 4X4 (GAUZE/BANDAGES/DRESSINGS) ×2 IMPLANT
DURAPREP 26ML APPLICATOR (WOUND CARE) ×2 IMPLANT
ELECT REM PT RETURN 9FT ADLT (ELECTROSURGICAL) ×2
ELECTRODE REM PT RTRN 9FT ADLT (ELECTROSURGICAL) ×1 IMPLANT
EVACUATOR 1/8 PVC DRAIN (DRAIN) IMPLANT
GAUZE SPONGE 4X4 12PLY STRL LF (GAUZE/BANDAGES/DRESSINGS) ×2 IMPLANT
GAUZE XEROFORM 5X9 LF (GAUZE/BANDAGES/DRESSINGS) ×2 IMPLANT
GLOVE BIOGEL PI IND STRL 7.5 (GLOVE) ×1 IMPLANT
GLOVE BIOGEL PI IND STRL 8 (GLOVE) ×1 IMPLANT
GLOVE BIOGEL PI INDICATOR 7.5 (GLOVE) ×1
GLOVE BIOGEL PI INDICATOR 8 (GLOVE) ×1
GLOVE ECLIPSE 7.0 STRL STRAW (GLOVE) ×4 IMPLANT
GLOVE ORTHO TXT STRL SZ7.5 (GLOVE) ×2 IMPLANT
GOWN PREVENTION PLUS LG XLONG (DISPOSABLE) IMPLANT
GOWN STRL NON-REIN LRG LVL3 (GOWN DISPOSABLE) ×8 IMPLANT
KIT BASIN OR (CUSTOM PROCEDURE TRAY) ×2 IMPLANT
KIT ROOM TURNOVER OR (KITS) ×2 IMPLANT
MANIFOLD NEPTUNE II (INSTRUMENTS) ×2 IMPLANT
NEEDLE 1/2 CIR MAYO (NEEDLE) IMPLANT
NEEDLE ASP BONE MRW 11GX15 J (NEEDLE) ×2 IMPLANT
NEEDLE BONE MARROW 8GX6 FENEST (NEEDLE) ×2 IMPLANT
NS IRRIG 1000ML POUR BTL (IV SOLUTION) ×2 IMPLANT
PACK ORTHO CERVICAL (CUSTOM PROCEDURE TRAY) ×2 IMPLANT
PAD ARMBOARD 7.5X6 YLW CONV (MISCELLANEOUS) ×4 IMPLANT
SPONGE GAUZE 4X4 12PLY (GAUZE/BANDAGES/DRESSINGS) ×2 IMPLANT
SPONGE LAP 4X18 X RAY DECT (DISPOSABLE) ×6 IMPLANT
SPONGE SURGIFOAM ABS GEL 100 (HEMOSTASIS) IMPLANT
STAPLER VISISTAT 35W (STAPLE) IMPLANT
STRIP CLOSURE SKIN 1/2X4 (GAUZE/BANDAGES/DRESSINGS) IMPLANT
STRIP VITOSS 25X100X4MM (Neuro Prosthesis/Implant) ×2 IMPLANT
SUT STEEL 1 (SUTURE) IMPLANT
SUT STEEL 2 (SUTURE) IMPLANT
SUT VIC AB 0 CT1 27 (SUTURE) ×1
SUT VIC AB 0 CT1 27XBRD ANBCTR (SUTURE) ×1 IMPLANT
SUT VIC AB 2-0 CT1 27 (SUTURE) ×2
SUT VIC AB 2-0 CT1 TAPERPNT 27 (SUTURE) ×2 IMPLANT
SUT VIC AB 3-0 X1 27 (SUTURE) IMPLANT
SUT VICRYL 0 TIES 12 18 (SUTURE) ×2 IMPLANT
SUT VICRYL 4-0 PS2 18IN ABS (SUTURE) IMPLANT
SUT VICRYL AB 2 0 TIES (SUTURE) ×2 IMPLANT
TAPE CLOTH SURG 6X10 WHT LF (GAUZE/BANDAGES/DRESSINGS) ×2 IMPLANT
TOWEL OR 17X24 6PK STRL BLUE (TOWEL DISPOSABLE) ×2 IMPLANT
TOWEL OR 17X26 10 PK STRL BLUE (TOWEL DISPOSABLE) ×2 IMPLANT
TRAY FOLEY CATH 14FR (SET/KITS/TRAYS/PACK) IMPLANT
WATER STERILE IRR 1000ML POUR (IV SOLUTION) ×2 IMPLANT
YANKAUER SUCT BULB TIP NO VENT (SUCTIONS) ×2 IMPLANT

## 2011-12-19 NOTE — Brief Op Note (Cosign Needed)
12/19/2011  10:12 AM  PATIENT:  Randall [name]  66 y.o. 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  PROCEDURE:  Procedure(s) (LRB): POSTERIOR CERVICAL FUSION/ LEVEL 2 (N/A) Right iliac crest bone marrow aspirate VITOSS graft placed at fusion sites of C4-5 and C6-7  SURGEON:  Surgeon(s) and Role:    * Eldred Manges, MD - Primary  PHYSICIAN ASSISTANT: Nhia Heaphy PAC  ASSISTANTS: none   ANESTHESIA:   general  EBL:  Total I/O In: 1600 [I.V.:1600] Out: 700 [Urine:325; Blood:375]  BLOOD ADMINISTERED:none  DRAINS: Penrose drain in the POSTERIOR NECK   LOCAL MEDICATIONS USED:  MARCAINE     SPECIMEN:  No Specimen  DISPOSITION OF SPECIMEN:  N/A  COUNTS:  YES  TOURNIQUET:  * No tourniquets in log *  DICTATION: .Note written in EPIC  PLAN OF CARE: Admit for overnight observation  PATIENT DISPOSITION:  PACU - hemodynamically stable.   Delay start of Pharmacological VTE agent (>24hrs) due to surgical blood loss or risk of bleeding: yes

## 2011-12-19 NOTE — Op Note (Signed)
NAMEELIAH, Randall [name]               ACCOUNT NO.:  [id number]  MEDICAL RECORD NO.:  [id number]  LOCATION:  [facility]                         FACILITY:  [facility]  PHYSICIAN:  Canisha Issac C. Ophelia Charter, M.D.    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  POSTOPERATIVE DIAGNOSIS:  C4-5, C6-7 pseudoarthrosis status post 3 level cervical fusion for cervical myelopathy with cord compression.  PROCEDURE:  C4-5, C6-7 posterior cervical fusion with wiring iliac aspirate bone marrow and Vitoss.  SURGEON:  Adaya Garmany C. Ophelia Charter, MD.  ASSISTANT:  Maud Deed, [facility], medically necessary and present for the entire procedure.  EBL:  300 mL.  COMPLICATIONS:  None.  This is a 66 year old male had surgery 7 months ago when he presented with the progressive myelopathic gait, leg weakness, falling, inability to ambulate up stairs with lower extremity progressive weakness, and hyperreflexia.  MRI scan showed severe cord compression with cord changes consistent with myelopathy at the C5-6 level due to the hard and soft disk over longer segment.  Corpectomy was performed, and he had a long graft placed from C5-C7, and then the single allograft plate placed anteriorly at the C4-5 level and plate application 3 levels anteriorly. We did discuss with him extensively that he may require second operation when three-level fusion is being performed along allograft bones are being used to try to get this to heal that he may require later procedure.  We discussed with him before the first procedure back in May another option had been to immediately come back couple of months later as a secondary procedure in the posterior fusion but in many cases, even with 3 level fusions, the fusion may heal anteriorly and the  second procedure may not be necessary.  Unfortunately he has continued to have pain in his neck, he has been working his lower extremity weakness has been significantly improved.  He has been able to walk up steps not using any walking aids.  Does not use a cane anymore and has had improvement in his upper extremity strength.  X-rays have showed progressive loosening at the bottom screw at C7 and resorption at the allograft C7 vertebral body interspace consistent with progressive pseudoarthrosis.  He had no broken hardware but the C4-5 level also showed poor incorporation.  He is a smoker and CT scan preoperatively showed lucency on CT scan fine cuts both planes completely through both levels consistent with pseudoarthrosis.  He had good healing at the C5-6 level.  PROCEDURE:  After induction of general anesthesia, orotracheal intubation, a Foley catheter placement with the potential for possible increased blood loss with poster cervical fusion.  The patient placed prone on chest rolls.  Horseshoe head holder.  Careful positioning and checking the eyes.  Arms were padded at the side yellow pads over the ulnar nerve.  Some tape was applied to the skin to help pull the down the posterior cervical skin folds area squared 10/15 drapes, iliac crest on the right was left open and triangled off  with three 10/10 drapes in both the poster cervical and right posterior iliac was prepped with DuraPrep.  Sterile Mayo stand at the head, the area squared with towels, sterile skin marker, Betadine, Steri-Drape both areas and then cervical laminectomy sheet or thyroid sheet was used.  Half sheet at the top, time-out procedure was completed.  Preoperative antibiotics were given. Midline incision was made extending from palpable landmarks from C3 to C7-T1 spinous process interspace subperiosteal dissection on the lamina. There was bleeding noted.  Combination of the Bovie, bipolar, thrombin- soaked  Gelfoam packing and repeating process with careful hemostasis was performed until the operative field was dry.  Checking with a Kocher clamps, there was motion at C6-7 as expected.  No motion at C5-6 at the C4-5 level.  There was motion which was about 2 mm, and C3-4 moved in a normal fashion multiple millimeters.  Kocher clamp was placed between the C6-C7 spinous process, one between C5 and C6.  Initial x-ray showed the shoulders on the way, repeat films taken with Allis clamp plate placed on C4 spinous process.  Arm was pulled down and this confirmed appropriate levels and the plate anteriorly was visualized as well.  A 24-gauge wire was twisted into a cable using 3 strands, wet sponge was used running along this was pulled in tension to keep the twisted wire smoothing and uniform and was cut.  Then right-angle clamp was used, and it was passed around the top of C4 after making a small notch and then passed underneath C5 tightened down on the right side using the wire twister twisting it down securely with a Cobb retractor placed to keep the wire and a small notch had been made in the spinous process at the base of C4 so it would not slide off.  Tightened down securely, would not move.  It was tested with Kocher and there was no motion at this level, wire was cut.  Identical procedure was repeated at the C6-7 level passing the wire underneath the C7 then around the top of the C6.  This was again tightened down securely.  There was no motion after tightening and it was cut and then another x-ray was taken for confirmation. Retractor self-retaining was not removed but the wire was clearly seen in C4-5 and the wire could also be seen down at C6-7 not quite as clearly due to the self-retaining tractor.  Archer Asa still being in position.  The lamina had then been exposed, right and left sides were repaired with 4-mm bur browsing up the surface for good bleeding surface.  Bone marrow  aspirate 10 cc__________ was obtained from the right posterior iliac using the Ortho Vitoss aspiration kit.  Cortex was entered with a sharp, dull was switched and then pushed and tapped down with a hammer and then 10 mL of blood was aspirated as it was pulled back advancing and backing up to maximize the amount of osteoprogenitor cells that are desirable for the bone fusion.  Cement was removed sponges placed and at this point, the wire passing and burrowing of the bone was performed as listed above.  This gave the bone time to soak into the Vitoss strip left in the package and was cut into 4 pieces and each one of the pieces was packed either on the right or the left at the C4-5 level and then also down to the C6-7 level, down to the bleeding bone prepared laminal surface.  Deep fascia was then closed with interrupted  0 Vicryl.  A Hemovac was placed.  Subcutaneous tissue was reapproximated and then skin closure postop dressing.  Tincture of benzoin, Steri-Strips, Marcaine infiltration, postop dressing, soft cervical collar. Instrument count, needle count was correct.     Adreanna Fickel C. Ophelia Charter, M.D.     MCY/MEDQ  D:  12/19/2011  T:  12/19/2011  Job:  [id number]

## 2011-12-19 NOTE — Transfer of Care (Signed)
Immediate Anesthesia Transfer of Care Note  Patient: Randall [name]  Procedure(s) Performed: Procedure(s) (LRB): POSTERIOR CERVICAL FUSION/FORAMINOTOMY LEVEL 2 (N/A)  Patient Location: PACU  Anesthesia Type: General  Level of Consciousness: awake, alert , oriented and sedated  Airway & Oxygen Therapy: Patient Spontanous Breathing and Patient connected to nasal cannula oxygen  Post-op Assessment: Report given to PACU RN, Post -op Vital signs reviewed and stable and Patient moving all extremities  Post vital signs: Reviewed and stable  Complications: No apparent anesthesia complications

## 2011-12-19 NOTE — Progress Notes (Addendum)
Patient ID: Randall [name], male   DOB: 03-29-1946, 66 y.o.   MRN: [id number]  Instructions and RX for oxycodone and robaxin on chart.  Drain to be removed in am 12-20-11 by rounding MD.  New dressing apply to neck wound and posterior hip stab incision.  Pt will be discharged home if stable.  OV 2weeks

## 2011-12-19 NOTE — Interval H&P Note (Signed)
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 After consideration of risks, benefits and other options for treatment, the patient has consented to  Procedure(s) (LRB): POSTERIOR CERVICAL FUSION/FORAMINOTOMY LEVEL 2 (N/A) as a surgical intervention .  The patients' history has been reviewed, patient examined, no change in status, stable for surgery.  I have reviewed the patients' chart and labs.  Questions were answered to the patient's satisfaction.     [facility]  Surgery planned discussed yesterday in office visit with patient, plan of procedure discussed, ?'s answered. CT and new plain xrays reviewed. He requests we proceed as  Planned.

## 2011-12-19 NOTE — Preoperative (Signed)
Beta Blockers   Reason not to administer Beta Blockers:Not Applicable 

## 2011-12-19 NOTE — Anesthesia Postprocedure Evaluation (Signed)
Anesthesia Post Note  Patient: Randall [name]  Procedure(s) Performed: Procedure(s) (LRB): POSTERIOR CERVICAL FUSION/FORAMINOTOMY LEVEL 2 (N/A)  Anesthesia type: general  Patient location: PACU  Post pain: Pain level controlled  Post assessment: Patient's Cardiovascular Status Stable  Last Vitals:  Filed Vitals:   12/19/11 1021  BP: 147/80  Pulse:   Temp: 36.6 C  Resp:     Post vital signs: Reviewed and stable  Level of consciousness: sedated  Complications: No apparent anesthesia complications

## 2011-12-19 NOTE — Anesthesia Preprocedure Evaluation (Addendum)
Anesthesia Evaluation  Patient identified by MRN, date of birth, ID band Patient awake    Reviewed: Allergy & Precautions, H&P , NPO status , Patient's Chart, lab work & pertinent test results, reviewed documented beta blocker date and time   History of Anesthesia Complications Negative for: history of anesthetic complications  Airway Mallampati: III TM Distance: >3 FB Neck ROM: Full    Dental   Pulmonary Current Smoker,          Cardiovascular neg cardio ROS     Neuro/Psych    GI/Hepatic negative GI ROS, Neg liver ROS,   Endo/Other  Negative Endocrine ROS  Renal/GU negative Renal ROS  Genitourinary negative   Musculoskeletal   Abdominal   Peds  Hematology negative hematology ROS (+)   Anesthesia Other Findings   Reproductive/Obstetrics                          Anesthesia Physical Anesthesia Plan  ASA: II  Anesthesia Plan: General   Post-op Pain Management:    Induction: Intravenous  Airway Management Planned: Oral ETT  Additional Equipment:   Intra-op Plan:   Post-operative Plan: Extubation in OR  Informed Consent: I have reviewed the patients History and Physical, chart, labs and discussed the procedure including the risks, benefits and alternatives for the proposed anesthesia with the patient or authorized representative who has indicated his/her understanding and acceptance.     Plan Discussed with: CRNA and Surgeon  Anesthesia Plan Comments:         Anesthesia Quick Evaluation

## 2011-12-19 NOTE — Plan of Care (Signed)
Problem: Consults Goal: Diagnosis - Spinal Surgery Cervical Spine Fusion     

## 2011-12-20 NOTE — Progress Notes (Signed)
Pt doing well Walking Hv dced Dc to home today

## 2011-12-20 NOTE — Progress Notes (Signed)
Patient ID: Randall [name], male   DOB: [birth year], 66 y.o.   MRN: [id number] Pt. Discharged 12/20/2011  8:11 AM Discharge instructions reviewed with patient/family. Patient/family verbalized understanding. All Rx's given. Questions answered as needed. Pt. Discharged to home with family/self. Taken off unit via W/C. Lurline Idol [facility]

## 2011-12-25 ENCOUNTER — Encounter (HOSPITAL_COMMUNITY): Payer: Self-pay | Admitting: Orthopaedic Surgery

## 2013-02-03 DIAGNOSIS — R41840 Attention and concentration deficit: Secondary | ICD-10-CM | POA: Diagnosis not present

## 2013-02-08 DIAGNOSIS — M545 Low back pain, unspecified: Secondary | ICD-10-CM | POA: Diagnosis not present

## 2013-02-08 DIAGNOSIS — M47817 Spondylosis without myelopathy or radiculopathy, lumbosacral region: Secondary | ICD-10-CM | POA: Diagnosis not present

## 2013-02-21 DIAGNOSIS — IMO0002 Reserved for concepts with insufficient information to code with codable children: Secondary | ICD-10-CM | POA: Diagnosis not present

## 2013-02-21 DIAGNOSIS — M545 Low back pain, unspecified: Secondary | ICD-10-CM | POA: Diagnosis not present

## 2013-06-09 DIAGNOSIS — N400 Enlarged prostate without lower urinary tract symptoms: Secondary | ICD-10-CM | POA: Diagnosis not present

## 2013-06-09 DIAGNOSIS — N529 Male erectile dysfunction, unspecified: Secondary | ICD-10-CM | POA: Diagnosis not present

## 2013-06-09 DIAGNOSIS — R39198 Other difficulties with micturition: Secondary | ICD-10-CM | POA: Diagnosis not present

## 2013-06-09 DIAGNOSIS — R3913 Splitting of urinary stream: Secondary | ICD-10-CM | POA: Diagnosis not present

## 2013-07-22 DIAGNOSIS — N400 Enlarged prostate without lower urinary tract symptoms: Secondary | ICD-10-CM | POA: Diagnosis not present

## 2013-07-22 DIAGNOSIS — N529 Male erectile dysfunction, unspecified: Secondary | ICD-10-CM | POA: Diagnosis not present

## 2013-07-22 DIAGNOSIS — R39198 Other difficulties with micturition: Secondary | ICD-10-CM | POA: Diagnosis not present

## 2013-10-14 DIAGNOSIS — M25569 Pain in unspecified knee: Secondary | ICD-10-CM | POA: Diagnosis not present

## 2013-10-18 ENCOUNTER — Other Ambulatory Visit: Payer: Self-pay | Admitting: Family Medicine

## 2013-10-18 DIAGNOSIS — H60509 Unspecified acute noninfective otitis externa, unspecified ear: Secondary | ICD-10-CM | POA: Diagnosis not present

## 2013-10-18 DIAGNOSIS — Z125 Encounter for screening for malignant neoplasm of prostate: Secondary | ICD-10-CM | POA: Diagnosis not present

## 2013-10-18 DIAGNOSIS — I6529 Occlusion and stenosis of unspecified carotid artery: Secondary | ICD-10-CM | POA: Diagnosis not present

## 2013-10-18 DIAGNOSIS — G2581 Restless legs syndrome: Secondary | ICD-10-CM | POA: Diagnosis not present

## 2013-10-18 DIAGNOSIS — I771 Stricture of artery: Secondary | ICD-10-CM

## 2013-10-28 ENCOUNTER — Ambulatory Visit
Admission: RE | Admit: 2013-10-28 | Discharge: 2013-10-28 | Disposition: A | Discharge status: Home / Self Care | Payer: Medicare Other | Source: Ambulatory Visit | Attending: Family Medicine | Admitting: Family Medicine

## 2013-10-28 DIAGNOSIS — I658 Occlusion and stenosis of other precerebral arteries: Secondary | ICD-10-CM | POA: Diagnosis not present

## 2013-10-28 DIAGNOSIS — I771 Stricture of artery: Secondary | ICD-10-CM

## 2013-11-18 DIAGNOSIS — Z23 Encounter for immunization: Secondary | ICD-10-CM | POA: Diagnosis not present

## 2013-11-18 DIAGNOSIS — G2589 Other specified extrapyramidal and movement disorders: Secondary | ICD-10-CM | POA: Diagnosis not present

## 2014-01-13 DIAGNOSIS — R39198 Other difficulties with micturition: Secondary | ICD-10-CM | POA: Diagnosis not present

## 2014-01-13 DIAGNOSIS — N400 Enlarged prostate without lower urinary tract symptoms: Secondary | ICD-10-CM | POA: Diagnosis not present

## 2014-01-13 DIAGNOSIS — N200 Calculus of kidney: Secondary | ICD-10-CM | POA: Diagnosis not present

## 2014-01-13 DIAGNOSIS — R350 Frequency of micturition: Secondary | ICD-10-CM | POA: Diagnosis not present

## 2014-01-23 DIAGNOSIS — M545 Low back pain, unspecified: Secondary | ICD-10-CM | POA: Diagnosis not present

## 2014-02-05 DIAGNOSIS — M25559 Pain in unspecified hip: Secondary | ICD-10-CM | POA: Diagnosis not present

## 2014-02-05 DIAGNOSIS — M545 Low back pain, unspecified: Secondary | ICD-10-CM | POA: Diagnosis not present

## 2014-02-10 DIAGNOSIS — M545 Low back pain, unspecified: Secondary | ICD-10-CM | POA: Diagnosis not present

## 2014-02-17 DIAGNOSIS — R39198 Other difficulties with micturition: Secondary | ICD-10-CM | POA: Diagnosis not present

## 2014-02-17 DIAGNOSIS — R350 Frequency of micturition: Secondary | ICD-10-CM | POA: Diagnosis not present

## 2014-02-17 DIAGNOSIS — N400 Enlarged prostate without lower urinary tract symptoms: Secondary | ICD-10-CM | POA: Diagnosis not present

## 2014-02-17 DIAGNOSIS — R3915 Urgency of urination: Secondary | ICD-10-CM | POA: Diagnosis not present

## 2014-02-24 DIAGNOSIS — M542 Cervicalgia: Secondary | ICD-10-CM | POA: Diagnosis not present

## 2014-02-24 DIAGNOSIS — M25569 Pain in unspecified knee: Secondary | ICD-10-CM | POA: Diagnosis not present

## 2014-02-24 DIAGNOSIS — M545 Low back pain, unspecified: Secondary | ICD-10-CM | POA: Diagnosis not present

## 2014-03-08 DIAGNOSIS — IMO0002 Reserved for concepts with insufficient information to code with codable children: Secondary | ICD-10-CM | POA: Diagnosis not present

## 2014-03-08 DIAGNOSIS — M47817 Spondylosis without myelopathy or radiculopathy, lumbosacral region: Secondary | ICD-10-CM | POA: Diagnosis not present

## 2014-03-21 DIAGNOSIS — M47817 Spondylosis without myelopathy or radiculopathy, lumbosacral region: Secondary | ICD-10-CM | POA: Diagnosis not present

## 2014-03-21 DIAGNOSIS — IMO0002 Reserved for concepts with insufficient information to code with codable children: Secondary | ICD-10-CM | POA: Diagnosis not present

## 2014-03-25 DIAGNOSIS — M47817 Spondylosis without myelopathy or radiculopathy, lumbosacral region: Secondary | ICD-10-CM | POA: Diagnosis not present

## 2014-03-28 DIAGNOSIS — M47817 Spondylosis without myelopathy or radiculopathy, lumbosacral region: Secondary | ICD-10-CM | POA: Diagnosis not present

## 2014-03-28 DIAGNOSIS — IMO0002 Reserved for concepts with insufficient information to code with codable children: Secondary | ICD-10-CM | POA: Diagnosis not present

## 2014-04-05 DIAGNOSIS — R262 Difficulty in walking, not elsewhere classified: Secondary | ICD-10-CM | POA: Diagnosis not present

## 2014-04-05 DIAGNOSIS — M545 Low back pain, unspecified: Secondary | ICD-10-CM | POA: Diagnosis not present

## 2014-04-11 DIAGNOSIS — M545 Low back pain, unspecified: Secondary | ICD-10-CM | POA: Diagnosis not present

## 2014-04-11 DIAGNOSIS — R262 Difficulty in walking, not elsewhere classified: Secondary | ICD-10-CM | POA: Diagnosis not present

## 2014-04-13 DIAGNOSIS — M545 Low back pain, unspecified: Secondary | ICD-10-CM | POA: Diagnosis not present

## 2014-04-13 DIAGNOSIS — R262 Difficulty in walking, not elsewhere classified: Secondary | ICD-10-CM | POA: Diagnosis not present

## 2014-04-18 DIAGNOSIS — M47817 Spondylosis without myelopathy or radiculopathy, lumbosacral region: Secondary | ICD-10-CM | POA: Diagnosis not present

## 2014-04-18 DIAGNOSIS — IMO0002 Reserved for concepts with insufficient information to code with codable children: Secondary | ICD-10-CM | POA: Diagnosis not present

## 2014-04-19 ENCOUNTER — Other Ambulatory Visit: Payer: Self-pay | Admitting: Orthopaedic Surgery

## 2014-04-19 DIAGNOSIS — R29898 Other symptoms and signs involving the musculoskeletal system: Secondary | ICD-10-CM

## 2014-04-19 DIAGNOSIS — M79605 Pain in left leg: Secondary | ICD-10-CM

## 2014-04-19 DIAGNOSIS — M79604 Pain in right leg: Secondary | ICD-10-CM

## 2014-04-19 DIAGNOSIS — M5136 Other intervertebral disc degeneration, lumbar region: Secondary | ICD-10-CM

## 2014-04-24 ENCOUNTER — Inpatient Hospital Stay
Admission: RE | Admit: 2014-04-24 | Discharge: 2014-04-24 | Disposition: A | Discharge status: Home / Self Care | Payer: Self-pay | Source: Ambulatory Visit | Attending: Orthopaedic Surgery | Admitting: Orthopaedic Surgery

## 2014-04-24 ENCOUNTER — Ambulatory Visit
Admission: RE | Admit: 2014-04-24 | Discharge: 2014-04-24 | Disposition: A | Discharge status: Home / Self Care | Payer: Medicare Other | Source: Ambulatory Visit | Attending: Orthopaedic Surgery | Admitting: Orthopaedic Surgery

## 2014-04-24 ENCOUNTER — Other Ambulatory Visit: Payer: Self-pay | Admitting: Orthopaedic Surgery

## 2014-04-24 VITALS — BP 138/73 | HR 58

## 2014-04-24 DIAGNOSIS — R52 Pain, unspecified: Secondary | ICD-10-CM

## 2014-04-24 DIAGNOSIS — M960 Pseudarthrosis after fusion or arthrodesis: Secondary | ICD-10-CM

## 2014-04-24 DIAGNOSIS — M5136 Other intervertebral disc degeneration, lumbar region: Secondary | ICD-10-CM

## 2014-04-24 DIAGNOSIS — M79604 Pain in right leg: Secondary | ICD-10-CM

## 2014-04-24 DIAGNOSIS — M47817 Spondylosis without myelopathy or radiculopathy, lumbosacral region: Secondary | ICD-10-CM | POA: Diagnosis not present

## 2014-04-24 DIAGNOSIS — R29898 Other symptoms and signs involving the musculoskeletal system: Secondary | ICD-10-CM

## 2014-04-24 DIAGNOSIS — M79605 Pain in left leg: Secondary | ICD-10-CM

## 2014-04-24 DIAGNOSIS — M48061 Spinal stenosis, lumbar region without neurogenic claudication: Secondary | ICD-10-CM | POA: Diagnosis not present

## 2014-04-24 DIAGNOSIS — M5126 Other intervertebral disc displacement, lumbar region: Secondary | ICD-10-CM | POA: Diagnosis not present

## 2014-04-24 MED ORDER — DIAZEPAM 5 MG PO TABS
5.0000 mg | ORAL_TABLET | Freq: Once | ORAL | Status: AC
  Administered 2014-04-24: 5 mg via ORAL

## 2014-04-24 MED ORDER — MEPERIDINE HCL 100 MG/ML IJ SOLN
100.0000 mg | Freq: Once | INTRAMUSCULAR | Status: AC
  Administered 2014-04-24: 100 mg via INTRAMUSCULAR

## 2014-04-24 MED ORDER — ONDANSETRON HCL 4 MG/2ML IJ SOLN
4.0000 mg | Freq: Once | INTRAMUSCULAR | Status: AC
  Administered 2014-04-24: 4 mg via INTRAMUSCULAR

## 2014-04-24 MED ORDER — IOHEXOL 180 MG/ML  SOLN
20.0000 mL | Freq: Once | INTRAMUSCULAR | Status: AC | PRN
  Administered 2014-04-24: 20 mL via INTRATHECAL

## 2014-04-24 NOTE — Progress Notes (Signed)
Pt states he has been off tramadol for the past 2 days. Discharge instructions

## 2014-04-24 NOTE — Discharge Instructions (Signed)
Myelogram Discharge Instructions  1. Go home and rest quietly for the next 24 hours.  It is important to lie flat for the next 24 hours.  Get up only to go to the restroom.  You may lie in the bed or on a couch on your back, your stomach, your left side or your right side.  You may have one pillow under your head.  You may have pillows between your knees while you are on your side or under your knees while you are on your back.  2. DO NOT drive today.  Recline the seat as far back as it will go, while still wearing your seat belt, on the way home.  3. You may get up to go to the bathroom as needed.  You may sit up for 10 minutes to eat.  You may resume your normal diet and medications unless otherwise indicated.  Drink lots of extra fluids today and tomorrow.  4. The incidence of headache, nausea, or vomiting is about 5% (one in 20 patients).  If you develop a headache, lie flat and drink plenty of fluids until the headache goes away.  Caffeinated beverages may be helpful.  If you develop severe nausea and vomiting or a headache that does not go away with flat bed rest, call [phone].  5. You may resume normal activities after your 24 hours of bed rest is over; however, do not exert yourself strongly or do any heavy lifting tomorrow. If when you get up you have a headache when standing, go back to bed and force fluids for another 24 hours.  6. Call your physician for a follow-up appointment.  The results of your myelogram will be sent directly to your physician by the following day.  7. If you have any questions or if complications develop after you arrive home, please call [phone].  Discharge instructions have been explained to the patient.  The patient, or the person responsible for the patient, fully understands these instructions.      May resume Tramadol on April 25, 2014, after 9:30 am.

## 2014-05-02 DIAGNOSIS — M47817 Spondylosis without myelopathy or radiculopathy, lumbosacral region: Secondary | ICD-10-CM | POA: Diagnosis not present

## 2014-05-02 DIAGNOSIS — IMO0002 Reserved for concepts with insufficient information to code with codable children: Secondary | ICD-10-CM | POA: Diagnosis not present

## 2014-05-03 ENCOUNTER — Other Ambulatory Visit (HOSPITAL_COMMUNITY): Payer: Self-pay | Admitting: Orthopaedic Surgery

## 2014-05-05 ENCOUNTER — Encounter (HOSPITAL_COMMUNITY): Payer: Self-pay | Admitting: Pharmacy Technician

## 2014-05-10 ENCOUNTER — Encounter (HOSPITAL_COMMUNITY): Payer: Self-pay

## 2014-05-10 ENCOUNTER — Other Ambulatory Visit (HOSPITAL_COMMUNITY): Payer: Self-pay | Admitting: *Deleted

## 2014-05-10 ENCOUNTER — Encounter (HOSPITAL_COMMUNITY)
Admission: RE | Admit: 2014-05-10 | Discharge: 2014-05-10 | Disposition: A | Discharge status: Home / Self Care | Payer: Medicare Other | Source: Ambulatory Visit | Attending: Orthopaedic Surgery | Admitting: Orthopaedic Surgery

## 2014-05-10 DIAGNOSIS — Z01812 Encounter for preprocedural laboratory examination: Secondary | ICD-10-CM | POA: Insufficient documentation

## 2014-05-10 DIAGNOSIS — Z01818 Encounter for other preprocedural examination: Secondary | ICD-10-CM | POA: Diagnosis not present

## 2014-05-10 DIAGNOSIS — Z01810 Encounter for preprocedural cardiovascular examination: Secondary | ICD-10-CM | POA: Diagnosis not present

## 2014-05-10 DIAGNOSIS — Z01811 Encounter for preprocedural respiratory examination: Secondary | ICD-10-CM | POA: Diagnosis not present

## 2014-05-10 HISTORY — DX: Restless legs syndrome: G25.81

## 2014-05-10 LAB — COMPREHENSIVE METABOLIC PANEL
ALK PHOS: 80 U/L (ref 39–117)
ALT: 14 U/L (ref 0–53)
AST: 13 U/L (ref 0–37)
Albumin: 3.8 g/dL (ref 3.5–5.2)
Anion gap: 11 (ref 5–15)
BUN: 16 mg/dL (ref 6–23)
CO2: 27 meq/L (ref 19–32)
Calcium: 9.2 mg/dL (ref 8.4–10.5)
Chloride: 103 mEq/L (ref 96–112)
Creatinine, Ser: 1.1 mg/dL (ref 0.50–1.35)
GFR, EST AFRICAN AMERICAN: 78 mL/min — AB (ref >90)
GFR, EST NON AFRICAN AMERICAN: 68 mL/min — AB (ref >90)
GLUCOSE: 94 mg/dL (ref 70–99)
POTASSIUM: 4.3 meq/L (ref 3.7–5.3)
Sodium: 141 mEq/L (ref 137–147)
Total Bilirubin: 0.5 mg/dL (ref 0.3–1.2)
Total Protein: 6.7 g/dL (ref 6.0–8.3)

## 2014-05-10 LAB — PROTIME-INR
INR: 1.11 (ref 0.00–1.49)
Prothrombin Time: 14.3 seconds (ref 11.6–15.2)

## 2014-05-10 LAB — CBC
HCT: 44.6 % (ref 39.0–52.0)
Hemoglobin: 15 g/dL (ref 13.0–17.0)
MCH: 32.9 pg (ref 26.0–34.0)
MCHC: 33.6 g/dL (ref 30.0–36.0)
MCV: 97.8 fL (ref 78.0–100.0)
Platelets: 235 10*3/uL (ref 150–400)
RBC: 4.56 MIL/uL (ref 4.22–5.81)
RDW: 13 % (ref 11.5–15.5)
WBC: 7 10*3/uL (ref 4.0–10.5)

## 2014-05-10 LAB — SURGICAL PCR SCREEN
MRSA, PCR: NEGATIVE
STAPHYLOCOCCUS AUREUS: NEGATIVE

## 2014-05-10 LAB — URINALYSIS, ROUTINE W REFLEX MICROSCOPIC
BILIRUBIN URINE: NEGATIVE
GLUCOSE, UA: NEGATIVE mg/dL
Hgb urine dipstick: NEGATIVE
Ketones, ur: NEGATIVE mg/dL
Leukocytes, UA: NEGATIVE
Nitrite: NEGATIVE
PH: 5 (ref 5.0–8.0)
Protein, ur: NEGATIVE mg/dL
Specific Gravity, Urine: 1.015 (ref 1.005–1.030)
Urobilinogen, UA: 0.2 mg/dL (ref 0.0–1.0)

## 2014-05-10 NOTE — Pre-Procedure Instructions (Signed)
XAIDEN FLEIG  05/10/2014   Your procedure is scheduled on:  Monday, May 15, 2014 at 11:15 AM.   Report to [facility] Entrance "A" Admitting Office at 9:15 AM.   Call this number if you have problems the morning of surgery: [phone]   Remember:   Do not eat food or drink liquids after midnight Sunday, 05/14/14.   Take these medicines the morning of surgery with A SIP OF WATER: traMADol (ULTRAM) - if needed    Do not wear jewelry.  Do not wear lotions, powders, or cologne. You may wear deodorant.  Men may shave face and neck.  Do not bring valuables to the hospital.  [facility] is not responsible                  for any belongings or valuables.               Contacts, dentures or bridgework may not be worn into surgery.  Leave suitcase in the car. After surgery it may be brought to your room.  For patients admitted to the hospital, discharge time is determined by your                treatment team.                 Special Instructions: [facility] - Preparing for Surgery  Before surgery, you can play an important role.  Because skin is not sterile, your skin needs to be as free of germs as possible.  You can reduce the number of germs on you skin by washing with CHG (chlorahexidine gluconate) soap before surgery.  CHG is an antiseptic cleaner which kills germs and bonds with the skin to continue killing germs even after washing.  Please DO NOT use if you have an allergy to CHG or antibacterial soaps.  If your skin becomes reddened/irritated stop using the CHG and inform your nurse when you arrive at Short Stay.  Do not shave (including legs and underarms) for at least 48 hours prior to the first CHG shower.  You may shave your face.  Please follow these instructions carefully:   1.  Shower with CHG Soap the night before surgery and the                                morning of Surgery.  2.  If you choose to wash your hair, wash your hair first as usual with your        normal shampoo.  3.  After you shampoo, rinse your hair and body thoroughly to remove the                      Shampoo.  4.  Use CHG as you would any other liquid soap.  You can apply chg directly       to the skin and wash gently with scrungie or a clean washcloth.  5.  Apply the CHG Soap to your body ONLY FROM THE NECK DOWN.        Do not use on open wounds or open sores.  Avoid contact with your eyes, ears, mouth and genitals (private parts).  Wash genitals (private parts) with your normal soap.  6.  Wash thoroughly, paying special attention to the area where your surgery        will be performed.  7.  Thoroughly rinse  your body with warm water from the neck down.  8.  DO NOT shower/wash with your normal soap after using and rinsing off       the CHG Soap.  9.  Pat yourself dry with a clean towel.            10.  Wear clean pajamas.            11.  Place clean sheets on your bed the night of your first shower and do not        sleep with pets.  Day of Surgery  Do not apply any lotions the morning of surgery.  Please wear clean clothes to the hospital/surgery center.     Please read over the following fact sheets that you were given: Pain Booklet, Coughing and Deep Breathing, MRSA Information and Surgical Site Infection Prevention

## 2014-05-10 NOTE — H&P (Signed)
Randall [name] is an 68 y.o. male.   Chief Complaint: back pain  HPI: ongoing problems with back and leg pain.  He had not been able to do any part-time work since the beginning of the spring.  He has had gradual progression over 6 months with back pain, difficulty ambulating more than about 100 feet.  He ambulates and has sharp back spasms which causes him to flex at his knees and flex at his hips and he states at times it has caused him to fall.  He states he is not able to predict when he will have these.  They bother him when he stands, he gets relief with a sitting position.  He is better when he is supine.  He denies any associated bowel or bladder symptoms.  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  He did have some myelopathic changes in his cord.  He states he has been able to ambulate prior to early this spring a good distance without problems.  He states his hands and numbness and tingling in his arm and arm strength have been better since the cervical procedure, but now states the back is giving him considerable problems.  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  No significant findings at 2-3 or 5-1.  He had essentially had 2 level disk protrusion with stenosis and more left lateral recess stenosis than right.     The patient has normally been followed by Dr. Hulan Fess.  He has been on meloxicam in the past, Flexeril.  He has had Medrol Dosepaks and tramadol.  He has had epidural injections that did not give him any relief.  Myelogram CT scan was just performed on 04/24/2014 and is available  for review which shows again disk protrusions at the 3-4, 4-5 level with increased narrowing with extension and slight improvement with flexion principally at the L3-4 level.  This shows progression of the L3-4 disk protrusion with standing and particularly with flexion with partial reduction and extension.  The patient has been through therapy without improvement  Past Medical History  Diagnosis Date  . Kidney stones     1 removed by litho. 1 by Cysto  . RLS (restless legs syndrome)     Past Surgical History  Procedure Laterality Date  . Tonsillectomy    . Cervial  May 2012    4-5, 6-7  . Posterior cervical fusion/foraminotomy  12/19/2011    Procedure: POSTERIOR CERVICAL FUSION/FORAMINOTOMY LEVEL 2;  Surgeon: Marybelle Killings, MD;  Location: [facility];  Service: Orthopedics;  Laterality: N/A;  Posterior Cervical Fusion, C4-5, C6-7 Wiring, Vitoss, Iliac aspirate    Family History  Problem Relation Age of Onset  . Anesthesia problems Neg Hx    Social History:  reports that he has been smoking.  He does not have any smokeless tobacco history on file. He reports that he drinks alcohol. He reports that he does not use illicit drugs.  Allergies: No Known Allergies  No prescriptions prior to admission    Results for orders placed during the hospital encounter of 05/10/14 (from the past 48 hour(s))  SURGICAL PCR SCREEN  Status: None   Collection Time    05/10/14  9:20 AM      Result Value Ref Range   MRSA, PCR NEGATIVE  NEGATIVE   Staphylococcus aureus NEGATIVE  NEGATIVE   Comment:            The Xpert SA Assay (FDA     approved for NASAL specimens     in patients over 13 years of age),     is one component of     a comprehensive surveillance     program.  Test performance has     been validated by [employer] for patients greater     than or equal to 86 year old.     It is not intended     to diagnose infection nor to     guide or monitor treatment.  URINALYSIS, ROUTINE W  REFLEX MICROSCOPIC     Status: None   Collection Time    05/10/14  9:20 AM      Result Value Ref Range   Color, Urine YELLOW  YELLOW   APPearance CLEAR  CLEAR   Specific Gravity, Urine 1.015  1.005 - 1.030   pH 5.0  5.0 - 8.0   Glucose, UA NEGATIVE  NEGATIVE mg/dL   Hgb urine dipstick NEGATIVE  NEGATIVE   Bilirubin Urine NEGATIVE  NEGATIVE   Ketones, ur NEGATIVE  NEGATIVE mg/dL   Protein, ur NEGATIVE  NEGATIVE mg/dL   Urobilinogen, UA 0.2  0.0 - 1.0 mg/dL   Nitrite NEGATIVE  NEGATIVE   Leukocytes, UA NEGATIVE  NEGATIVE   Comment: MICROSCOPIC NOT DONE ON URINES WITH NEGATIVE PROTEIN, BLOOD, LEUKOCYTES, NITRITE, OR GLUCOSE <1000 mg/dL.  CBC     Status: None   Collection Time    05/10/14  9:21 AM      Result Value Ref Range   WBC 7.0  4.0 - 10.5 K/uL   RBC 4.56  4.22 - 5.81 MIL/uL   Hemoglobin 15.0  13.0 - 17.0 g/dL   HCT 44.6  39.0 - 52.0 %   MCV 97.8  78.0 - 100.0 fL   MCH 32.9  26.0 - 34.0 pg   MCHC 33.6  30.0 - 36.0 g/dL   RDW 13.0  11.5 - 15.5 %   Platelets 235  150 - 400 K/uL  COMPREHENSIVE METABOLIC PANEL     Status: Abnormal   Collection Time    05/10/14  9:21 AM      Result Value Ref Range   Sodium 141  137 - 147 mEq/L   Potassium 4.3  3.7 - 5.3 mEq/L   Chloride 103  96 - 112 mEq/L   CO2 27  19 - 32 mEq/L   Glucose, Bld 94  70 - 99 mg/dL   BUN 16  6 - 23 mg/dL   Creatinine, Ser 1.10  0.50 - 1.35 mg/dL   Calcium 9.2  8.4 - 10.5 mg/dL   Total Protein 6.7  6.0 - 8.3 g/dL   Albumin 3.8  3.5 - 5.2 g/dL   AST 13  0 - 37 U/L   ALT 14  0 - 53 U/L   Alkaline Phosphatase 80  39 - 117 U/L   Total Bilirubin 0.5  0.3 - 1.2 mg/dL   GFR calc non Af Amer 68 (*) >90 mL/min   GFR calc Af Amer 78 (*) >90 mL/min   Comment: (NOTE)     The eGFR has been calculated using the  CKD EPI equation.     This calculation has not been validated in all clinical situations.     eGFR's persistently <90 mL/min signify possible Chronic Kidney     Disease.   Anion gap 11  5 - 15  PROTIME-INR      Status: None   Collection Time    05/10/14  9:21 AM      Result Value Ref Range   Prothrombin Time 14.3  11.6 - 15.2 seconds   INR 1.11  0.00 - 1.49   Dg Chest 2 View  05/10/2014   CLINICAL DATA:  Pre operative respiratory exam for lumbar decompression and diskectomy.  EXAM: CHEST - 2 VIEW  COMPARISON:  05/11/2012  FINDINGS: The heart size and mediastinal contours are within normal limits. Lungs show stable mild chronic disease/ hyperinflation. There is no evidence of pulmonary edema, consolidation, pneumothorax, nodule or pleural fluid. The visualized skeletal structures are unremarkable.  IMPRESSION: No active disease.  Stable mild chronic lung disease/COPD.   Electronically Signed   By: Aletta Edouard M.D.   On: 05/10/2014 09:59    Review of Systems  Constitutional: Negative.   HENT: Negative.   Eyes: Negative.   Respiratory: Negative.   Cardiovascular: Negative.   Gastrointestinal: Negative.   Genitourinary: Negative.   Musculoskeletal: Positive for back pain.  Skin: Negative.   Neurological: Negative.   Endo/Heme/Allergies: Negative.   Psychiatric/Behavioral: Negative.     There were no vitals taken for this visit. Physical Exam  Constitutional: He is oriented to person, place, and time. He appears well-developed and well-nourished.  HENT:  Head: Normocephalic and atraumatic.  Eyes: EOM are normal. Pupils are equal, round, and reactive to light.  Cardiovascular: Normal rate and regular rhythm.   Respiratory: Effort normal.  GI: Soft.  Musculoskeletal:     Anterior and posterior cervical incisions are well healed.  The skin over his lumbar spine is normal.  He ambulates  several steps and then has sharp spasms where he stops, goes through knee flexion, hip flexion procedure with sharp pain and then is able to resume short ambulating.  He gets relief in the spine and sitting position.     Distal pulses are 2+ and symmetrical.  Anterior tib, EHL, hip flexion, hamstrings  are strong.  No adductor weakness.  After the patient's cervical surgery he was walking well, but as noted, still has trace findings for his myelopathy.  He did not report any history of falling, weakness or problems after cervical procedure.   Neurological: He is alert and oriented to person, place, and time.  Skin: Skin is warm and dry.    ASSESSMENT:  Two level disk protrusion with stenosis, congenital short pedicles.  He has been through therapy, epidurals, pain medication, Medrol Dosepak, muscle relaxants and the symptoms have been present greater than 6 months.   PLAN:  The plan would be 2 level decompression L3-4, L4-5 with microdiscectomy as needed at the L3-4 and L4-5 level on the left.  Surgery discussed.  Risks of surgery, risk of bleeding, infection, reoperation, need for later fusion.  All questions answered.  Questions are elicited and answered.  He understands and request to proceed  Epimenio Foot 05/10/2014, 6:23 PM

## 2014-05-14 MED ORDER — CEFAZOLIN SODIUM-DEXTROSE 2-3 GM-% IV SOLR
2.0000 g | INTRAVENOUS | Status: AC
  Administered 2014-05-15: 2 g via INTRAVENOUS
  Filled 2014-05-14: qty 50

## 2014-05-15 ENCOUNTER — Inpatient Hospital Stay (HOSPITAL_COMMUNITY): Payer: Medicare Other | Admitting: Certified Registered"

## 2014-05-15 ENCOUNTER — Encounter (HOSPITAL_COMMUNITY)
Admission: RE | Disposition: A | Discharge status: Home / Self Care | Payer: Self-pay | Source: Ambulatory Visit | Attending: Orthopaedic Surgery

## 2014-05-15 ENCOUNTER — Encounter (HOSPITAL_COMMUNITY): Payer: Self-pay | Admitting: *Deleted

## 2014-05-15 ENCOUNTER — Observation Stay (HOSPITAL_COMMUNITY)
Admission: RE | Admit: 2014-05-15 | Discharge: 2014-05-16 | Disposition: A | Discharge status: Home / Self Care | Payer: Medicare Other | Source: Ambulatory Visit | Attending: Orthopaedic Surgery | Admitting: Orthopaedic Surgery

## 2014-05-15 ENCOUNTER — Observation Stay (HOSPITAL_COMMUNITY): Payer: Medicare Other

## 2014-05-15 ENCOUNTER — Encounter (HOSPITAL_COMMUNITY): Payer: Medicare Other | Admitting: Certified Registered"

## 2014-05-15 DIAGNOSIS — J449 Chronic obstructive pulmonary disease, unspecified: Secondary | ICD-10-CM | POA: Insufficient documentation

## 2014-05-15 DIAGNOSIS — M5126 Other intervertebral disc displacement, lumbar region: Secondary | ICD-10-CM | POA: Diagnosis not present

## 2014-05-15 DIAGNOSIS — F17200 Nicotine dependence, unspecified, uncomplicated: Secondary | ICD-10-CM | POA: Insufficient documentation

## 2014-05-15 DIAGNOSIS — M47817 Spondylosis without myelopathy or radiculopathy, lumbosacral region: Secondary | ICD-10-CM | POA: Diagnosis not present

## 2014-05-15 DIAGNOSIS — IMO0002 Reserved for concepts with insufficient information to code with codable children: Secondary | ICD-10-CM | POA: Diagnosis not present

## 2014-05-15 DIAGNOSIS — J4489 Other specified chronic obstructive pulmonary disease: Secondary | ICD-10-CM | POA: Diagnosis not present

## 2014-05-15 DIAGNOSIS — M48061 Spinal stenosis, lumbar region without neurogenic claudication: Secondary | ICD-10-CM | POA: Diagnosis not present

## 2014-05-15 HISTORY — PX: LUMBAR LAMINECTOMY: SHX95

## 2014-05-15 SURGERY — MICRODISCECTOMY LUMBAR LAMINECTOMY
Anesthesia: General | Site: Back

## 2014-05-15 MED ORDER — METHOCARBAMOL 500 MG PO TABS
500.0000 mg | ORAL_TABLET | Freq: Four times a day (QID) | ORAL | Status: DC | PRN
  Filled 2014-05-15: qty 1

## 2014-05-15 MED ORDER — KETOROLAC TROMETHAMINE 30 MG/ML IJ SOLN
30.0000 mg | Freq: Four times a day (QID) | INTRAMUSCULAR | Status: AC
  Administered 2014-05-15 – 2014-05-16 (×4): 30 mg via INTRAVENOUS
  Filled 2014-05-15 (×6): qty 1

## 2014-05-15 MED ORDER — ACETAMINOPHEN 650 MG RE SUPP: 650.0000 mg | RECTAL | Status: DC | PRN

## 2014-05-15 MED ORDER — METHOCARBAMOL 500 MG PO TABS: 500.0000 mg | ORAL_TABLET | Freq: Four times a day (QID) | ORAL | Status: DC | PRN

## 2014-05-15 MED ORDER — MENTHOL 3 MG MT LOZG: 1.0000 | LOZENGE | OROMUCOSAL | Status: DC | PRN

## 2014-05-15 MED ORDER — BUPIVACAINE HCL (PF) 0.25 % IJ SOLN
INTRAMUSCULAR | Status: DC | PRN
Start: 2014-05-15 — End: 2014-05-15

## 2014-05-15 MED ORDER — FLEET ENEMA 7-19 GM/118ML RE ENEM: 1.0000 | ENEMA | Freq: Once | RECTAL | Status: AC | PRN

## 2014-05-15 MED ORDER — BUPIVACAINE HCL (PF) 0.25 % IJ SOLN
INTRAMUSCULAR | Status: AC
  Filled 2014-05-15: qty 30

## 2014-05-15 MED ORDER — MIDAZOLAM HCL 5 MG/5ML IJ SOLN
INTRAMUSCULAR | Status: DC | PRN
  Administered 2014-05-15: 2 mg via INTRAVENOUS

## 2014-05-15 MED ORDER — HYDROCODONE-ACETAMINOPHEN 5-325 MG PO TABS: 1.0000 | ORAL_TABLET | ORAL | Status: DC | PRN

## 2014-05-15 MED ORDER — SODIUM CHLORIDE 0.9 % IJ SOLN: 3.0000 mL | INTRAMUSCULAR | Status: DC | PRN

## 2014-05-15 MED ORDER — EPHEDRINE SULFATE 50 MG/ML IJ SOLN
INTRAMUSCULAR | Status: AC
  Filled 2014-05-15: qty 1

## 2014-05-15 MED ORDER — LACTATED RINGERS IV SOLN
INTRAVENOUS | Status: DC
  Administered 2014-05-15 (×2): via INTRAVENOUS

## 2014-05-15 MED ORDER — PHENYLEPHRINE 40 MCG/ML (10ML) SYRINGE FOR IV PUSH (FOR BLOOD PRESSURE SUPPORT)
PREFILLED_SYRINGE | INTRAVENOUS | Status: AC
  Filled 2014-05-15: qty 10

## 2014-05-15 MED ORDER — KETOROLAC TROMETHAMINE 30 MG/ML IJ SOLN
INTRAMUSCULAR | Status: AC
  Filled 2014-05-15: qty 1

## 2014-05-15 MED ORDER — SUFENTANIL CITRATE 50 MCG/ML IV SOLN
INTRAVENOUS | Status: DC | PRN
  Administered 2014-05-15: 20 ug via INTRAVENOUS
  Administered 2014-05-15: 10 ug via INTRAVENOUS

## 2014-05-15 MED ORDER — EPHEDRINE SULFATE 50 MG/ML IJ SOLN
INTRAMUSCULAR | Status: DC | PRN
  Administered 2014-05-15: 10 mg via INTRAVENOUS
  Administered 2014-05-15 (×2): 15 mg via INTRAVENOUS

## 2014-05-15 MED ORDER — OXYCODONE-ACETAMINOPHEN 5-325 MG PO TABS
1.0000 | ORAL_TABLET | ORAL | Status: DC | PRN
Start: 2014-05-15 — End: 2014-05-16

## 2014-05-15 MED ORDER — MORPHINE SULFATE 2 MG/ML IJ SOLN: 1.0000 mg | INTRAMUSCULAR | Status: DC | PRN

## 2014-05-15 MED ORDER — PANTOPRAZOLE SODIUM 40 MG IV SOLR
40.0000 mg | Freq: Every day | INTRAVENOUS | Status: DC
  Filled 2014-05-15 (×2): qty 40

## 2014-05-15 MED ORDER — MIDAZOLAM HCL 2 MG/2ML IJ SOLN
INTRAMUSCULAR | Status: AC
  Filled 2014-05-15: qty 2

## 2014-05-15 MED ORDER — TRAMADOL HCL 50 MG PO TABS
100.0000 mg | ORAL_TABLET | Freq: Two times a day (BID) | ORAL | Status: DC | PRN
  Administered 2014-05-15: 100 mg via ORAL
  Filled 2014-05-15: qty 2

## 2014-05-15 MED ORDER — ARTIFICIAL TEARS OP OINT
TOPICAL_OINTMENT | OPHTHALMIC | Status: DC | PRN
  Administered 2014-05-15: 1 via OPHTHALMIC

## 2014-05-15 MED ORDER — ONDANSETRON HCL 4 MG/2ML IJ SOLN
INTRAMUSCULAR | Status: AC
  Filled 2014-05-15: qty 2

## 2014-05-15 MED ORDER — ZOLPIDEM TARTRATE 5 MG PO TABS: 5.0000 mg | ORAL_TABLET | Freq: Every evening | ORAL | Status: DC | PRN

## 2014-05-15 MED ORDER — LIDOCAINE HCL (CARDIAC) 20 MG/ML IV SOLN
INTRAVENOUS | Status: AC
  Filled 2014-05-15: qty 5

## 2014-05-15 MED ORDER — LIDOCAINE HCL (CARDIAC) 20 MG/ML IV SOLN
INTRAVENOUS | Status: DC | PRN
  Administered 2014-05-15: 50 mg via INTRAVENOUS

## 2014-05-15 MED ORDER — PROPOFOL 10 MG/ML IV BOLUS
INTRAVENOUS | Status: AC
  Filled 2014-05-15: qty 20

## 2014-05-15 MED ORDER — PROPOFOL 10 MG/ML IV BOLUS
INTRAVENOUS | Status: DC | PRN
  Administered 2014-05-15: 140 mg via INTRAVENOUS

## 2014-05-15 MED ORDER — BISACODYL 10 MG RE SUPP: 10.0000 mg | Freq: Every day | RECTAL | Status: DC | PRN

## 2014-05-15 MED ORDER — SUFENTANIL CITRATE 50 MCG/ML IV SOLN
INTRAVENOUS | Status: AC
  Filled 2014-05-15: qty 1

## 2014-05-15 MED ORDER — PHENYLEPHRINE HCL 10 MG/ML IJ SOLN
INTRAMUSCULAR | Status: DC | PRN
  Administered 2014-05-15 (×4): 80 ug via INTRAVENOUS

## 2014-05-15 MED ORDER — ONDANSETRON HCL 4 MG/2ML IJ SOLN: 4.0000 mg | INTRAMUSCULAR | Status: DC | PRN

## 2014-05-15 MED ORDER — OXYCODONE HCL 5 MG/5ML PO SOLN: 5.0000 mg | Freq: Once | ORAL | Status: DC | PRN

## 2014-05-15 MED ORDER — HYDROMORPHONE HCL PF 1 MG/ML IJ SOLN
0.2500 mg | INTRAMUSCULAR | Status: DC | PRN
  Administered 2014-05-15 (×2): 0.5 mg via INTRAVENOUS

## 2014-05-15 MED ORDER — KCL IN DEXTROSE-NACL 20-5-0.45 MEQ/L-%-% IV SOLN
INTRAVENOUS | Status: DC
  Administered 2014-05-15: 18:00:00 via INTRAVENOUS
  Filled 2014-05-15 (×3): qty 1000

## 2014-05-15 MED ORDER — ONDANSETRON HCL 4 MG/2ML IJ SOLN
INTRAMUSCULAR | Status: DC | PRN
  Administered 2014-05-15: 4 mg via INTRAVENOUS

## 2014-05-15 MED ORDER — DOCUSATE SODIUM 100 MG PO CAPS
100.0000 mg | ORAL_CAPSULE | Freq: Two times a day (BID) | ORAL | Status: DC
  Administered 2014-05-15 – 2014-05-16 (×2): 100 mg via ORAL
  Filled 2014-05-15 (×3): qty 1

## 2014-05-15 MED ORDER — SODIUM CHLORIDE 0.9 % IJ SOLN
INTRAMUSCULAR | Status: AC
  Filled 2014-05-15: qty 10

## 2014-05-15 MED ORDER — SODIUM CHLORIDE 0.9 % IV SOLN: 250.0000 mL | INTRAVENOUS | Status: DC

## 2014-05-15 MED ORDER — CEFAZOLIN SODIUM 1-5 GM-% IV SOLN
1.0000 g | Freq: Three times a day (TID) | INTRAVENOUS | Status: AC
  Administered 2014-05-15 – 2014-05-16 (×2): 1 g via INTRAVENOUS
  Filled 2014-05-15 (×2): qty 50

## 2014-05-15 MED ORDER — SODIUM CHLORIDE 0.9 % IJ SOLN
3.0000 mL | Freq: Two times a day (BID) | INTRAMUSCULAR | Status: DC
Start: 2014-05-15 — End: 2014-05-16

## 2014-05-15 MED ORDER — METHOCARBAMOL 1000 MG/10ML IJ SOLN
500.0000 mg | Freq: Four times a day (QID) | INTRAVENOUS | Status: DC | PRN
  Filled 2014-05-15: qty 5

## 2014-05-15 MED ORDER — ACETAMINOPHEN 325 MG PO TABS: 650.0000 mg | ORAL_TABLET | ORAL | Status: DC | PRN

## 2014-05-15 MED ORDER — ONDANSETRON HCL 4 MG/2ML IJ SOLN: 4.0000 mg | Freq: Once | INTRAMUSCULAR | Status: DC | PRN

## 2014-05-15 MED ORDER — HYDROMORPHONE HCL PF 1 MG/ML IJ SOLN
INTRAMUSCULAR | Status: AC
  Filled 2014-05-15: qty 1

## 2014-05-15 MED ORDER — POLYETHYLENE GLYCOL 3350 17 G PO PACK: 17.0000 g | PACK | Freq: Every day | ORAL | Status: DC | PRN

## 2014-05-15 MED ORDER — OXYCODONE-ACETAMINOPHEN 5-325 MG PO TABS
1.0000 | ORAL_TABLET | ORAL | Status: DC | PRN
  Administered 2014-05-15 – 2014-05-16 (×3): 2 via ORAL
  Filled 2014-05-15 (×3): qty 2

## 2014-05-15 MED ORDER — THROMBIN 20000 UNITS EX SOLR
CUTANEOUS | Status: DC | PRN
  Administered 2014-05-15: 12:00:00 via TOPICAL

## 2014-05-15 MED ORDER — THROMBIN 20000 UNITS EX SOLR
CUTANEOUS | Status: AC
  Filled 2014-05-15: qty 20000

## 2014-05-15 MED ORDER — ROPINIROLE HCL 0.5 MG PO TABS
0.5000 mg | ORAL_TABLET | Freq: Every day | ORAL | Status: DC
  Administered 2014-05-15: 0.5 mg via ORAL
  Filled 2014-05-15 (×2): qty 1

## 2014-05-15 MED ORDER — TAMSULOSIN HCL 0.4 MG PO CAPS
0.4000 mg | ORAL_CAPSULE | Freq: Every day | ORAL | Status: DC
  Administered 2014-05-15 – 2014-05-16 (×2): 0.4 mg via ORAL
  Filled 2014-05-15 (×2): qty 1

## 2014-05-15 MED ORDER — ROCURONIUM BROMIDE 100 MG/10ML IV SOLN
INTRAVENOUS | Status: DC | PRN
  Administered 2014-05-15: 10 mg via INTRAVENOUS
  Administered 2014-05-15: 40 mg via INTRAVENOUS

## 2014-05-15 MED ORDER — OXYBUTYNIN CHLORIDE ER 5 MG PO TB24
5.0000 mg | ORAL_TABLET | Freq: Every day | ORAL | Status: DC
  Filled 2014-05-15 (×2): qty 1

## 2014-05-15 MED ORDER — OXYCODONE HCL 5 MG PO TABS: 5.0000 mg | ORAL_TABLET | Freq: Once | ORAL | Status: DC | PRN

## 2014-05-15 MED ORDER — PHENOL 1.4 % MT LIQD: 1.0000 | OROMUCOSAL | Status: DC | PRN

## 2014-05-15 SURGICAL SUPPLY — 46 items
BUR ROUND FLUTED 4 SOFT TCH (BURR) ×2 IMPLANT
CORDS BIPOLAR (ELECTRODE) ×2 IMPLANT
COVER SURGICAL LIGHT HANDLE (MISCELLANEOUS) ×2 IMPLANT
DERMABOND ADHESIVE PROPEN (GAUZE/BANDAGES/DRESSINGS) ×1
DERMABOND ADVANCED (GAUZE/BANDAGES/DRESSINGS) ×1
DERMABOND ADVANCED .7 DNX12 (GAUZE/BANDAGES/DRESSINGS) ×1 IMPLANT
DERMABOND ADVANCED .7 DNX6 (GAUZE/BANDAGES/DRESSINGS) ×1 IMPLANT
DRAPE MICROSCOPE LEICA (MISCELLANEOUS) ×2 IMPLANT
DRAPE PROXIMA HALF (DRAPES) ×4 IMPLANT
DRSG EMULSION OIL 3X3 NADH (GAUZE/BANDAGES/DRESSINGS) ×2 IMPLANT
DRSG MEPILEX BORDER 4X4 (GAUZE/BANDAGES/DRESSINGS) IMPLANT
DRSG MEPILEX BORDER 4X8 (GAUZE/BANDAGES/DRESSINGS) ×2 IMPLANT
DURAPREP 26ML APPLICATOR (WOUND CARE) ×2 IMPLANT
ELECT REM PT RETURN 9FT ADLT (ELECTROSURGICAL) ×2
ELECTRODE REM PT RTRN 9FT ADLT (ELECTROSURGICAL) ×1 IMPLANT
GLOVE BIOGEL PI IND STRL 7.5 (GLOVE) ×1 IMPLANT
GLOVE BIOGEL PI IND STRL 8 (GLOVE) ×1 IMPLANT
GLOVE BIOGEL PI INDICATOR 7.5 (GLOVE) ×1
GLOVE BIOGEL PI INDICATOR 8 (GLOVE) ×1
GLOVE ECLIPSE 7.0 STRL STRAW (GLOVE) ×2 IMPLANT
GLOVE ORTHO TXT STRL SZ7.5 (GLOVE) ×2 IMPLANT
GOWN STRL REUS W/ TWL LRG LVL3 (GOWN DISPOSABLE) ×3 IMPLANT
GOWN STRL REUS W/TWL LRG LVL3 (GOWN DISPOSABLE) ×3
KIT BASIN OR (CUSTOM PROCEDURE TRAY) ×2 IMPLANT
KIT ROOM TURNOVER OR (KITS) ×2 IMPLANT
MANIFOLD NEPTUNE II (INSTRUMENTS) IMPLANT
NDL SUT .5 MAYO 1.404X.05X (NEEDLE) ×1 IMPLANT
NEEDLE 22X1 1/2 (OR ONLY) (NEEDLE) ×2 IMPLANT
NEEDLE MAYO TAPER (NEEDLE) ×1
NEEDLE SPNL 18GX3.5 QUINCKE PK (NEEDLE) ×2 IMPLANT
NS IRRIG 1000ML POUR BTL (IV SOLUTION) ×2 IMPLANT
PACK LAMINECTOMY ORTHO (CUSTOM PROCEDURE TRAY) ×2 IMPLANT
PAD ARMBOARD 7.5X6 YLW CONV (MISCELLANEOUS) ×4 IMPLANT
PATTIES SURGICAL .5 X.5 (GAUZE/BANDAGES/DRESSINGS) ×2 IMPLANT
PATTIES SURGICAL .75X.75 (GAUZE/BANDAGES/DRESSINGS) IMPLANT
SPONGE GAUZE 4X4 12PLY (GAUZE/BANDAGES/DRESSINGS) ×2 IMPLANT
SUT VIC AB 2-0 CT1 27 (SUTURE) ×1
SUT VIC AB 2-0 CT1 TAPERPNT 27 (SUTURE) ×1 IMPLANT
SUT VICRYL 0 TIES 12 18 (SUTURE) ×2 IMPLANT
SUT VICRYL 4-0 PS2 18IN ABS (SUTURE) IMPLANT
SUT VICRYL AB 2 0 TIES (SUTURE) ×2 IMPLANT
SYR 20ML ECCENTRIC (SYRINGE) IMPLANT
SYR CONTROL 10ML LL (SYRINGE) ×2 IMPLANT
TOWEL OR 17X24 6PK STRL BLUE (TOWEL DISPOSABLE) ×2 IMPLANT
TOWEL OR 17X26 10 PK STRL BLUE (TOWEL DISPOSABLE) ×2 IMPLANT
WATER STERILE IRR 1000ML POUR (IV SOLUTION) ×2 IMPLANT

## 2014-05-15 NOTE — Interval H&P Note (Signed)
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 After consideration of risks, benefits and other options for treatment, the patient has consented to  Procedure(s) with comments: MICRODISCECTOMY LUMBAR LAMINECTOMY (N/A) - L3-4, L4-5 Decompression, Left L3-4, L4-5 Microdiscectomy as a surgical intervention .  The patient's history has been reviewed, patient examined, no change in status, stable for surgery.  I have reviewed the patient's chart and labs.  Questions were answered to the patient's satisfaction.     Sabastion Hrdlicka C

## 2014-05-15 NOTE — Transfer of Care (Signed)
Immediate Anesthesia Transfer of Care Note  Patient: Randall [name]  Procedure(s) Performed: Procedure(s) with comments: MICRODISCECTOMY LUMBAR LAMINECTOMY (N/A) - L3-4, L4-5 Decompression, Left L3-4, L4-5 Microdiscectomy  Patient Location: PACU  Anesthesia Type:General  Level of Consciousness: awake, alert  and oriented  Airway & Oxygen Therapy: Patient Spontanous Breathing and Patient connected to nasal cannula oxygen  Post-op Assessment: Report given to PACU RN, Post -op Vital signs reviewed and stable and Patient moving all extremities  Post vital signs: Reviewed and stable  Complications: No apparent anesthesia complications

## 2014-05-15 NOTE — Brief Op Note (Cosign Needed)
05/15/2014  1:23 PM  PATIENT:  Randall [name]  68 y.o. 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  PROCEDURE:  Procedure(s) with comments: MICRODISCECTOMY LUMBAR LAMINECTOMY (N/A) - L3-4, L4-5 Decompression, Left L3-4, L4-5 Microdiscectomy  SURGEON:  Surgeon(s) and Role:    * Marybelle Killings, MD - Primary  PHYSICIAN ASSISTANT: [name] PAC  ASSISTANTS: none   ANESTHESIA:   general  EBL:  Total I/O In: 1200 [I.V.:1200] Out: -   BLOOD ADMINISTERED:none  DRAINS: none   LOCAL MEDICATIONS USED:  NONE  SPECIMEN:  No Specimen  DISPOSITION OF SPECIMEN:  N/A  COUNTS:  YES  TOURNIQUET:  * No tourniquets in log *  DICTATION: .Note written in EPIC  PLAN OF CARE: Admit for overnight observation  PATIENT DISPOSITION:  PACU - hemodynamically stable.   Delay start of Pharmacological VTE agent (>24hrs) due to surgical blood loss or risk of bleeding: yes

## 2014-05-15 NOTE — Anesthesia Preprocedure Evaluation (Signed)
Anesthesia Evaluation  Patient identified by MRN, date of birth, ID band Patient awake    Reviewed: Allergy & Precautions, H&P , NPO status , Patient's Chart, lab work & pertinent test results  Airway Mallampati: II TM Distance: >3 FB Neck ROM: Full    Dental  (+) Edentulous Upper, Edentulous Lower   Pulmonary Current Smoker,  breath sounds clear to auscultation        Cardiovascular Rhythm:Regular Rate:Normal     Neuro/Psych    GI/Hepatic   Endo/Other    Renal/GU      Musculoskeletal   Abdominal   Peds  Hematology   Anesthesia Other Findings   Reproductive/Obstetrics                           Anesthesia Physical Anesthesia Plan  ASA: II  Anesthesia Plan: General   Post-op Pain Management:    Induction: Intravenous  Airway Management Planned: Oral ETT  Additional Equipment:   Intra-op Plan:   Post-operative Plan: Extubation in OR  Informed Consent: I have reviewed the patients History and Physical, chart, labs and discussed the procedure including the risks, benefits and alternatives for the proposed anesthesia with the patient or authorized representative who has indicated his/her understanding and acceptance.     Plan Discussed with: CRNA and Anesthesiologist  Anesthesia Plan Comments:         Anesthesia Quick Evaluation

## 2014-05-15 NOTE — Discharge Instructions (Signed)
    No lifting greater than 10 lbs. Avoid bending, stooping and twisting. Walk in house for first week them may start to get out slowly increasing distance up to one mile by 3 weeks post op. Keep incision dry for 3 days, may use tegaderm or similar water impervious dressing.  

## 2014-05-15 NOTE — Anesthesia Procedure Notes (Signed)
Procedure Name: Intubation Date/Time: 05/15/2014 11:49 AM Performed by: Eligha Bridegroom Pre-anesthesia Checklist: Emergency Drugs available, Patient identified, Timeout performed and Suction available Patient Re-evaluated:Patient Re-evaluated prior to inductionOxygen Delivery Method: Circle system utilized Preoxygenation: Pre-oxygenation with 100% oxygen Intubation Type: IV induction Ventilation: Mask ventilation without difficulty and Oral airway inserted - appropriate to patient size Laryngoscope Size: Mac and 4 Grade View: Grade I Tube type: Oral Tube size: 8.0 mm Number of attempts: 1 Airway Equipment and Method: Stylet Placement Confirmation: ETT inserted through vocal cords under direct vision,  breath sounds checked- equal and bilateral and positive ETCO2 Secured at: 22 cm Tube secured with: Tape Dental Injury: Teeth and Oropharynx as per pre-operative assessment

## 2014-05-15 NOTE — Anesthesia Postprocedure Evaluation (Signed)
Anesthesia Post Note  Patient: Randall [name]  Procedure(s) Performed: Procedure(s) (LRB): MICRODISCECTOMY LUMBAR LAMINECTOMY (N/A)  Anesthesia type: general  Patient location: PACU  Post pain: Pain level controlled  Post assessment: Patient's Cardiovascular Status Stable  Last Vitals:  Filed Vitals:   05/15/14 1555  BP: 128/64  Pulse: 70  Temp: 36.6 C  Resp: 15    Post vital signs: Reviewed and stable  Level of consciousness: sedated  Complications: No apparent anesthesia complications

## 2014-05-15 NOTE — Interval H&P Note (Signed)
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 After consideration of risks, benefits and other options for treatment, the patient has consented to  Procedure(s) with comments: MICRODISCECTOMY LUMBAR LAMINECTOMY (N/A) - L3-4, L4-5 Decompression, Left L3-4, L4-5 Microdiscectomy as a surgical intervention .  The patient's history has been reviewed, patient examined, no change in status, stable for surgery.  I have reviewed the patient's chart and labs.  Questions were answered to the patient's satisfaction.     Yousif Edelson C

## 2014-05-16 ENCOUNTER — Encounter (HOSPITAL_COMMUNITY): Payer: Self-pay | Admitting: Orthopaedic Surgery

## 2014-05-16 DIAGNOSIS — M5126 Other intervertebral disc displacement, lumbar region: Secondary | ICD-10-CM | POA: Diagnosis not present

## 2014-05-16 MED ORDER — METHOCARBAMOL 500 MG PO TABS: 500.0000 mg | ORAL_TABLET | Freq: Four times a day (QID) | ORAL | Status: DC | PRN

## 2014-05-16 MED ORDER — OXYCODONE-ACETAMINOPHEN 5-325 MG PO TABS: 1.0000 | ORAL_TABLET | ORAL | Status: DC | PRN

## 2014-05-16 NOTE — Op Note (Signed)
NAMEBARKLEY, Randall [name]               ACCOUNT NO.:  [id number]  MEDICAL RECORD NO.:  [id number]  LOCATION:  5N07C                        FACILITY:  [facility]  PHYSICIAN:  Randall [name], M.D.    DATE OF BIRTH:  13-Jun-1946  DATE OF PROCEDURE:  05/15/2014 DATE OF DISCHARGE:                              OPERATIVE REPORT   PREOPERATIVE DIAGNOSIS:  Foraminal and lateral recess stenosis, herniated nucleus pulposus L2-3, disk bulge L4-5, left.  POSTOPERATIVE DIAGNOSIS:  Foraminal and lateral recess stenosis, herniated nucleus pulposus L2-3, disk bulge L4-5, left.  PROCEDURE:  Decompression L3-4, L4-5 with left microdiskectomy, L3-4 and L4-5.  SURGEON:  Randall [name], M.D.  ASSISTANT:  Randall Hay, PA-C, medically necessary and present for the entire procedure.  ANESTHESIA:  General.  ESTIMATED BLOOD LOSS:  Less than 100 mL.  DRAINS:  None.  COMPLICATIONS:  None.  INDICATIONS:  A 68 year old male with persistent back and left leg pain, which occurs just with standing.  MRI scan showed some disk bulge at L3- 4 and L4-5 on the left, and due to persistent symptoms, myelogram and CT scan was performed, which showed when the patient is upright, L3-4 disk had significant enlargement and compression causing stenosis.  He did have lateral recess and some foraminal disk bulge at 4-5 level as well as at the 3-4 level, both on the left side.  PROCEDURE:  After induction of general anesthesia and orotracheal intubation, the patient was placed prone on chest rolls careful prepping with DuraPrep.  Preoperative Ancef prophylaxis.  Area was squared with towels, Betadine, Steri-Drape.  Laminectomy sheets and drapes.  Time-out procedure completed.  The patient was relatively thin.  Palpation of landmarks.  Incision was made, subperiosteal dissection and Kocher clamps placed at the planned level of decompression just above the 3-4 level down to the top of the L5 vertebral body, which would be at  the inferior aspect of the L4-5 disk.  Posterior elements were removed.  Bur was used to thin the lamina.  Kerrison rongeur, operative microscope was draped, brought in, thick chunks of ligament were removed at both 3-4 and 4-5.  Lateral ligaments were removed out to the level of the pedicle at the L3-4 level disk was protruding.  It was soft, bouncy, and annulus was incised.  Chunks of ligaments were removed using straight up and down micropituitary, straight up and down regular pituitaries.  Randall [name] was used to help push disk fragments from the left side laterally from the foramina into the center of the disk and then they were grasped with micropituitary, regular pituitary, and removed chunks of disk until the nerve root was free.  In midline, Epstein curettes were used to push down the disk some small amount of disk material centrally, but muscle was left and out the foramina, left paracentral and foraminal.  Once it was decompressed, nerve root was free.  Attention was turned to the 4-5 level.  Operative microscope was readjusted, 4-5 disk was exposed after decompressing chunks of ligament laterally.  Annulus was incised. Passes were made.  There was less prominent disk protrusion at this level.  Once disk was flat and after removal of the chunks of disk, nerve  root was free on palpation into the axilla and around the shoulder.  The nerve root showed no areas of compression, brought in the lateral gutter, no spikes of bone were present.  Ligament had been removed.  Dura was round.  Dura was intact.  Irrigation with saline solution.  Fascial closure with #1 Vicryl, 2-0 Vicryl subcutaneous tissue, subcuticular closure, Dermabond, postop dressing, and transferred to the recovery room in stable condition.  Instrument count and needle count was correct.     Randall [name], M.D.     MCY/MEDQ  D:  05/15/2014  T:  05/16/2014  Job:  [id number]

## 2014-05-16 NOTE — Progress Notes (Signed)
Subjective: 1 Day Post-Op Procedure(s) (LRB): MICRODISCECTOMY LUMBAR LAMINECTOMY (N/A) Patient reports pain as mild.  Good relief of pre-op lage pain.  " i feel great now that my leg does not hurt"  Objective: Vital signs in last 24 hours: Temp:  [97.5 F (36.4 C)-98.7 F (37.1 C)] 98.7 F (37.1 C) (07/21 0515) Pulse Rate:  [61-116] 78 (07/21 0515) Resp:  [9-21] 18 (07/21 0515) BP: (116-182)/(57-74) 123/61 mmHg (07/21 0515) SpO2:  [91 %-100 %] 98 % (07/21 0515) Weight:  [82.5 kg (181 lb 14.1 oz)] 82.5 kg (181 lb 14.1 oz) (07/20 0916)  Intake/Output from previous day: 07/20 0701 - 07/21 0700 In: 2647.5 [P.O.:200; I.V.:2347.5; IV Piggyback:100] Out: 1150 [Urine:1150] Intake/Output this shift:    No results found for this basename: HGB,  in the last 72 hours No results found for this basename: WBC, RBC, HCT, PLT,  in the last 72 hours No results found for this basename: NA, K, CL, CO2, BUN, CREATININE, GLUCOSE, CALCIUM,  in the last 72 hours No results found for this basename: LABPT, INR,  in the last 72 hours  Neurologically intact  Assessment/Plan: 1 Day Post-Op Procedure(s) (LRB): MICRODISCECTOMY LUMBAR LAMINECTOMY (N/A) Plan  Discharge home  [place] C 05/16/2014, 7:50 AM

## 2014-05-22 NOTE — Discharge Summary (Signed)
Physician Discharge Summary  Patient ID: Randall [name] MRN: [id number] DOB/AGE: [birth year] 68 y.o.  Admit date: 05/15/2014 Discharge date: 05/16/2014  Admission Diagnoses:  HNP (herniated nucleus pulposus), lumbar L3-4 and L4-5  Discharge Diagnoses:  Principal Problem:   HNP (herniated nucleus pulposus), lumbar L3-4 and L4-5  Past Medical History  Diagnosis Date  . Kidney stones     1 removed by litho. 1 by Cysto  . RLS (restless legs syndrome)     Surgeries: Procedure(s): MICRODISCECTOMY LUMBAR LAMINECTOMY LEFT L4-5 and LEFT L3-4  on 05/15/2014   Consultants (if any):  none  Discharged Condition: Improved  Hospital Course: Randall [name] is an 68 y.o. male who was admitted 05/15/2014 with a diagnosis of HNP (herniated nucleus pulposus), lumbar and went to the operating room on 05/15/2014 and underwent the above named procedures.    He was given perioperative antibiotics:      Anti-infectives   Start     Dose/Rate Route Frequency Ordered Stop   05/15/14 1600  ceFAZolin (ANCEF) IVPB 1 g/50 mL premix     1 g 100 mL/hr over 30 Minutes Intravenous Every 8 hours 05/15/14 1551 05/16/14 0102   05/15/14 0600  ceFAZolin (ANCEF) IVPB 2 g/50 mL premix     2 g 100 mL/hr over 30 Minutes Intravenous On call to O.R. 05/14/14 1346 05/15/14 1130    .  He was given sequential compression devices, early ambulation for DVT prophylaxis.  He benefited maximally from the hospital stay and there were no complications.    Recent vital signs:  Filed Vitals:   05/16/14 0515  BP: 123/61  Pulse: 78  Temp: 98.7 F (37.1 C)  Resp: 18    Recent laboratory studies:  Lab Results  Component Value Date   HGB 15.0 05/10/2014   HGB 14.7 12/12/2011   HGB 15.3 04/10/2011   Lab Results  Component Value Date   WBC 7.0 05/10/2014   PLT 235 05/10/2014   Lab Results  Component Value Date   INR 1.11 05/10/2014   Lab Results  Component Value Date   NA 141 05/10/2014   K 4.3 05/10/2014   CL 103  05/10/2014   CO2 27 05/10/2014   BUN 16 05/10/2014   CREATININE 1.10 05/10/2014   GLUCOSE 94 05/10/2014    Discharge Medications:     Medication List         methocarbamol 500 MG tablet  Commonly known as:  ROBAXIN  Take 1 tablet (500 mg total) by mouth every 6 (six) hours as needed for muscle spasms.     oxybutynin 5 MG 24 hr tablet  Commonly known as:  DITROPAN-XL  Take 5 mg by mouth at bedtime.     oxyCODONE-acetaminophen 5-325 MG per tablet  Commonly known as:  ROXICET  Take 1-2 tablets by mouth every 4 (four) hours as needed.     rOPINIRole 0.5 MG tablet  Commonly known as:  REQUIP  Take 0.5 mg by mouth at bedtime.     tamsulosin 0.4 MG Caps capsule  Commonly known as:  FLOMAX  Take 0.4 mg by mouth daily.     traMADol 50 MG tablet  Commonly known as:  ULTRAM  Take 100 mg by mouth 2 (two) times daily as needed for moderate pain.        Diagnostic Studies: Dg Chest 2 View  05/10/2014   CLINICAL DATA:  Pre operative respiratory exam for lumbar decompression and diskectomy.  EXAM: CHEST - 2 VIEW  COMPARISON:  05/11/2012  FINDINGS: The heart size and mediastinal contours are within normal limits. Lungs show stable mild chronic disease/ hyperinflation. There is no evidence of pulmonary edema, consolidation, pneumothorax, nodule or pleural fluid. The visualized skeletal structures are unremarkable.  IMPRESSION: No active disease.  Stable mild chronic lung disease/COPD.   Electronically Signed   By: Aletta Edouard M.D.   On: 05/10/2014 09:59   Ct Lumbar Spine W Contrast  04/24/2014   CLINICAL DATA:  Low back pain extending into the lower extremities bilaterally, left greater than right. Pelvic pain. The pain extends within an L5 and S1 dermatome. Pain is worse with standing or walking. The pain is relieved with sitting.  EXAM: LUMBAR MYELOGRAM  FLUOROSCOPY TIME:  1 min 4 seconds  PROCEDURE: After thorough discussion of risks and benefits of the procedure including bleeding,  infection, injury to nerves, blood vessels, adjacent structures as well as headache and CSF leak, written and oral informed consent was obtained. Consent was obtained by Dr. Lawrence Santiago. Time out form was completed.  Patient was positioned prone on the fluoroscopy table. Local anesthesia was provided with 1% lidocaine without epinephrine after prepped and draped in the usual sterile fashion. Puncture was performed at L2-3 using a 3 1/2 inch 22-gauge spinal needle via right paramedian approach. Using a single pass through the dura, the needle was placed within the thecal sac, with return of clear CSF. 15 mL of Omnipaque-180 was injected into the thecal sac, with normal opacification of the nerve roots and cauda equina consistent with free flow within the subarachnoid space.  I personally performed the lumbar puncture and administered the intrathecal contrast. I also personally supervised acquisition of the myelogram images.  TECHNIQUE: Contiguous axial images were obtained through the Lumbar spine after the intrathecal infusion of infusion. Coronal and sagittal reconstructions were obtained of the axial image sets.  COMPARISON:  MRI lumbar spine 03/25/2014  FINDINGS: LUMBAR MYELOGRAM FINDINGS:  A broad-based disc protrusion is again noted L3-4 with left greater than right lateral recess narrowing mild disc bulging is present at L4-5 without significant stenosis. The nerve roots at L4-5 and below fill normally.  The L3-4 disc is exaggerated scratch the the L3-4 disc protrusion is exaggerated with standing and particularly with flexion. There is partial reduction in extension.  CT LUMBAR MYELOGRAM FINDINGS:  The lumbar spine is imaged from the midbody of T12 through S3 4. Vertebral body heights and alignment are maintained. Conus medullaris terminates at L1, within normal limits. Limited imaging the abdomen demonstrates cystic changes in the kidneys bilaterally, incompletely imaged. Atherosclerotic calcifications are  present in the aorta. 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 Sub cm left para echo lymph nodes are evident. No pathologic lymph node enlargement is present.  L1-2:  Mild facet hypertrophy is worse on the left.  L2-3: A far left lateral disc protrusion is present. There is no significant focal stenosis. Mild facet hypertrophy is worse on the left.  L3-4: A leftward disc protrusion is present. Moderate facet hypertrophy and ligamentum flavum thickening is noted bilaterally. Mild lateral recess narrowing is worse on the left. Moderate left and mild right foraminal stenosis is present.  L4-5: A leftward disc protrusion is present. There is mild left lateral recess narrowing. Mild facet hypertrophy is noted bilaterally. Mild foraminal narrowing is worse on the left.  L5-S1: Minimal disc bulging is present. There is no significant focal stenosis.  IMPRESSION: LUMBAR MYELOGRAM IMPRESSION:  1. Leftward disc protrusion  with left greater than right lateral recess narrowing at L3-4. 2. The disc protrusion at L3-4 is exacerbated by standing and with flexion. There is partial reduction in extension. 3. Mild disc bulging at L4-5 without definite stenosis.  CT LUMBAR MYELOGRAM IMPRESSION:  1. Leftward disc protrusion and facet hypertrophy results and moderate left and mild right foraminal stenosis at L3-4. 2. Mild lateral recess narrowing bilaterally at L3-4 is worse on the left. 3. Mild left lateral recess and mild bilateral foraminal narrowing at L4-5, worse on the left. 4. Minimal disc bulging at L5-S1 without significant stenosis. 5. Far left lateral disc protrusion at L2-3 and facet hypertrophy without significant stenosis. 6. Mild facet hypertrophy at L1-2 without significant stenosis. 7. 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 8. Atherosclerosis. 9. Renal cysts bilaterally.   Electronically Signed   By: Lawrence Santiago M.D.   On: 04/24/2014 15:09   Dg  Lumbar Spine 1 View  05/15/2014   CLINICAL DATA:  L3-L4 and L4-L5 stenosis and herniated nucleus pulposus.  EXAM: LUMBAR SPINE - 1 VIEW  COMPARISON:  Lumbar spine CT scan 04/24/2014.  FINDINGS: A single cross-table lateral view of the lumbar spine demonstrates surgical probes in place projecting over the L3 spinous process posterior to the L3-L4 interspace, and over the superior aspect of the L5 spinous process, posterior to the L4-L5 interspace.  IMPRESSION: 1. Intraoperative localization, as above.   Electronically Signed   By: Vinnie Langton M.D.   On: 05/15/2014 18:39   Dg Myelography Lumbar Inj Lumbosacral  04/24/2014   CLINICAL DATA:  Low back pain extending into the lower extremities bilaterally, left greater than right. Pelvic pain. The pain extends within an L5 and S1 dermatome. Pain is worse with standing or walking. The pain is relieved with sitting.  EXAM: LUMBAR MYELOGRAM  FLUOROSCOPY TIME:  1 min 4 seconds  PROCEDURE: After thorough discussion of risks and benefits of the procedure including bleeding, infection, injury to nerves, blood vessels, adjacent structures as well as headache and CSF leak, written and oral informed consent was obtained. Consent was obtained by Dr. Lawrence Santiago. Time out form was completed.  Patient was positioned prone on the fluoroscopy table. Local anesthesia was provided with 1% lidocaine without epinephrine after prepped and draped in the usual sterile fashion. Puncture was performed at L2-3 using a 3 1/2 inch 22-gauge spinal needle via right paramedian approach. Using a single pass through the dura, the needle was placed within the thecal sac, with return of clear CSF. 15 mL of Omnipaque-180 was injected into the thecal sac, with normal opacification of the nerve roots and cauda equina consistent with free flow within the subarachnoid space.  I personally performed the lumbar puncture and administered the intrathecal contrast. I also personally supervised  acquisition of the myelogram images.  TECHNIQUE: Contiguous axial images were obtained through the Lumbar spine after the intrathecal infusion of infusion. Coronal and sagittal reconstructions were obtained of the axial image sets.  COMPARISON:  MRI lumbar spine 03/25/2014  FINDINGS: LUMBAR MYELOGRAM FINDINGS:  A broad-based disc protrusion is again noted L3-4 with left greater than right lateral recess narrowing mild disc bulging is present at L4-5 without significant stenosis. The nerve roots at L4-5 and below fill normally.  The L3-4 disc is exaggerated scratch the the L3-4 disc protrusion is exaggerated with standing and particularly with flexion. There is partial reduction in extension.  CT LUMBAR MYELOGRAM FINDINGS:  The lumbar spine is imaged  from the midbody of T12 through S3 4. Vertebral body heights and alignment are maintained. Conus medullaris terminates at L1, within normal limits. Limited imaging the abdomen demonstrates cystic changes in the kidneys bilaterally, incompletely imaged. Atherosclerotic calcifications are present in the aorta. 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 Sub cm left para echo lymph nodes are evident. No pathologic lymph node enlargement is present.  L1-2:  Mild facet hypertrophy is worse on the left.  L2-3: A far left lateral disc protrusion is present. There is no significant focal stenosis. Mild facet hypertrophy is worse on the left.  L3-4: A leftward disc protrusion is present. Moderate facet hypertrophy and ligamentum flavum thickening is noted bilaterally. Mild lateral recess narrowing is worse on the left. Moderate left and mild right foraminal stenosis is present.  L4-5: A leftward disc protrusion is present. There is mild left lateral recess narrowing. Mild facet hypertrophy is noted bilaterally. Mild foraminal narrowing is worse on the left.  L5-S1: Minimal disc bulging is present. There is no significant focal stenosis.  IMPRESSION:  LUMBAR MYELOGRAM IMPRESSION:  1. Leftward disc protrusion with left greater than right lateral recess narrowing at L3-4. 2. The disc protrusion at L3-4 is exacerbated by standing and with flexion. There is partial reduction in extension. 3. Mild disc bulging at L4-5 without definite stenosis.  CT LUMBAR MYELOGRAM IMPRESSION:  1. Leftward disc protrusion and facet hypertrophy results and moderate left and mild right foraminal stenosis at L3-4. 2. Mild lateral recess narrowing bilaterally at L3-4 is worse on the left. 3. Mild left lateral recess and mild bilateral foraminal narrowing at L4-5, worse on the left. 4. Minimal disc bulging at L5-S1 without significant stenosis. 5. Far left lateral disc protrusion at L2-3 and facet hypertrophy without significant stenosis. 6. Mild facet hypertrophy at L1-2 without significant stenosis. 7. 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 8. Atherosclerosis. 9. Renal cysts bilaterally.   Electronically Signed   By: Lawrence Santiago M.D.   On: 04/24/2014 15:09    Disposition: 01-Home or Self Care  DISCHARGE INSTRUCTIONS:  No lifting greater than 10 lbs. Avoid bending, stooping and twisting. Walk in house for first week them may start to get out slowly increasing distance up to one mile by 3 weeks post op. Keep incision dry for 3 days, may use tegaderm or similar water impervious dressing.  Follow-up Information   Follow up with YATES,MARK C, MD In 2 weeks.   Specialty:  Orthopedic Surgery   Contact information:   [place] [address] [phone]        Signed: Epimenio Foot 05/22/2014, 3:43 PM

## 2014-05-24 ENCOUNTER — Other Ambulatory Visit: Payer: Self-pay | Admitting: Orthopaedic Surgery

## 2014-05-24 ENCOUNTER — Ambulatory Visit
Admission: RE | Admit: 2014-05-24 | Discharge: 2014-05-24 | Disposition: A | Discharge status: Home / Self Care | Payer: Medicare Other | Source: Ambulatory Visit | Attending: Orthopaedic Surgery | Admitting: Orthopaedic Surgery

## 2014-05-24 DIAGNOSIS — M79609 Pain in unspecified limb: Secondary | ICD-10-CM | POA: Diagnosis not present

## 2014-05-24 DIAGNOSIS — M7989 Other specified soft tissue disorders: Secondary | ICD-10-CM | POA: Diagnosis not present

## 2014-05-24 DIAGNOSIS — R609 Edema, unspecified: Secondary | ICD-10-CM

## 2014-07-04 DIAGNOSIS — N400 Enlarged prostate without lower urinary tract symptoms: Secondary | ICD-10-CM | POA: Diagnosis not present

## 2014-07-04 DIAGNOSIS — R350 Frequency of micturition: Secondary | ICD-10-CM | POA: Diagnosis not present

## 2014-07-04 DIAGNOSIS — R351 Nocturia: Secondary | ICD-10-CM | POA: Diagnosis not present

## 2014-08-04 DIAGNOSIS — M47817 Spondylosis without myelopathy or radiculopathy, lumbosacral region: Secondary | ICD-10-CM | POA: Diagnosis not present

## 2014-08-11 DIAGNOSIS — R350 Frequency of micturition: Secondary | ICD-10-CM | POA: Diagnosis not present

## 2014-08-11 DIAGNOSIS — N401 Enlarged prostate with lower urinary tract symptoms: Secondary | ICD-10-CM | POA: Diagnosis not present

## 2014-08-11 DIAGNOSIS — N200 Calculus of kidney: Secondary | ICD-10-CM | POA: Diagnosis not present

## 2014-08-22 DIAGNOSIS — M549 Dorsalgia, unspecified: Secondary | ICD-10-CM | POA: Diagnosis not present

## 2014-08-22 DIAGNOSIS — F17200 Nicotine dependence, unspecified, uncomplicated: Secondary | ICD-10-CM | POA: Diagnosis not present

## 2014-09-15 DIAGNOSIS — M47817 Spondylosis without myelopathy or radiculopathy, lumbosacral region: Secondary | ICD-10-CM | POA: Diagnosis not present

## 2014-09-25 ENCOUNTER — Other Ambulatory Visit: Payer: Self-pay | Admitting: Orthopaedic Surgery

## 2014-09-26 ENCOUNTER — Other Ambulatory Visit: Payer: Self-pay | Admitting: Orthopaedic Surgery

## 2014-09-26 DIAGNOSIS — M545 Low back pain: Secondary | ICD-10-CM

## 2014-10-04 ENCOUNTER — Ambulatory Visit
Admission: RE | Admit: 2014-10-04 | Discharge: 2014-10-04 | Disposition: A | Discharge status: Home / Self Care | Payer: Medicare Other | Source: Ambulatory Visit | Attending: Orthopaedic Surgery | Admitting: Orthopaedic Surgery

## 2014-10-04 DIAGNOSIS — M5126 Other intervertebral disc displacement, lumbar region: Secondary | ICD-10-CM | POA: Diagnosis not present

## 2014-10-04 DIAGNOSIS — M4806 Spinal stenosis, lumbar region: Secondary | ICD-10-CM | POA: Diagnosis not present

## 2014-10-04 DIAGNOSIS — M545 Low back pain: Secondary | ICD-10-CM

## 2014-10-04 MED ORDER — GADOBENATE DIMEGLUMINE 529 MG/ML IV SOLN
17.0000 mL | Freq: Once | INTRAVENOUS | Status: AC | PRN
  Administered 2014-10-04: 17 mL via INTRAVENOUS

## 2014-10-13 DIAGNOSIS — M5126 Other intervertebral disc displacement, lumbar region: Secondary | ICD-10-CM | POA: Diagnosis not present

## 2014-10-13 DIAGNOSIS — M47817 Spondylosis without myelopathy or radiculopathy, lumbosacral region: Secondary | ICD-10-CM | POA: Diagnosis not present

## 2014-10-13 DIAGNOSIS — M4806 Spinal stenosis, lumbar region: Secondary | ICD-10-CM | POA: Diagnosis not present

## 2014-10-25 DIAGNOSIS — M47817 Spondylosis without myelopathy or radiculopathy, lumbosacral region: Secondary | ICD-10-CM | POA: Diagnosis not present

## 2014-11-10 DIAGNOSIS — M5126 Other intervertebral disc displacement, lumbar region: Secondary | ICD-10-CM | POA: Diagnosis not present

## 2014-11-10 DIAGNOSIS — M47817 Spondylosis without myelopathy or radiculopathy, lumbosacral region: Secondary | ICD-10-CM | POA: Diagnosis not present

## 2014-11-10 DIAGNOSIS — Z23 Encounter for immunization: Secondary | ICD-10-CM | POA: Diagnosis not present

## 2014-11-10 DIAGNOSIS — M4806 Spinal stenosis, lumbar region: Secondary | ICD-10-CM | POA: Diagnosis not present

## 2014-11-14 DIAGNOSIS — R350 Frequency of micturition: Secondary | ICD-10-CM | POA: Diagnosis not present

## 2014-11-20 DIAGNOSIS — M47816 Spondylosis without myelopathy or radiculopathy, lumbar region: Secondary | ICD-10-CM | POA: Diagnosis not present

## 2014-11-20 DIAGNOSIS — M545 Low back pain: Secondary | ICD-10-CM | POA: Diagnosis not present

## 2014-11-20 IMAGING — US US CAROTID DUPLEX BILAT
1 series · 13 of 24 positions shown · non-contrast
Comparison: None.

CLINICAL DATA: Right carotid stenosis.

EXAM:
BILATERAL CAROTID DUPLEX ULTRASOUND
TECHNIQUE: Gray scale imaging, color Doppler and duplex ultrasound were
performed of bilateral carotid and vertebral arteries in the neck.

[Series 1: us carotid duplex bilat · 0.08mm/px · 13 of 61 slices shown]
[im 1/61]
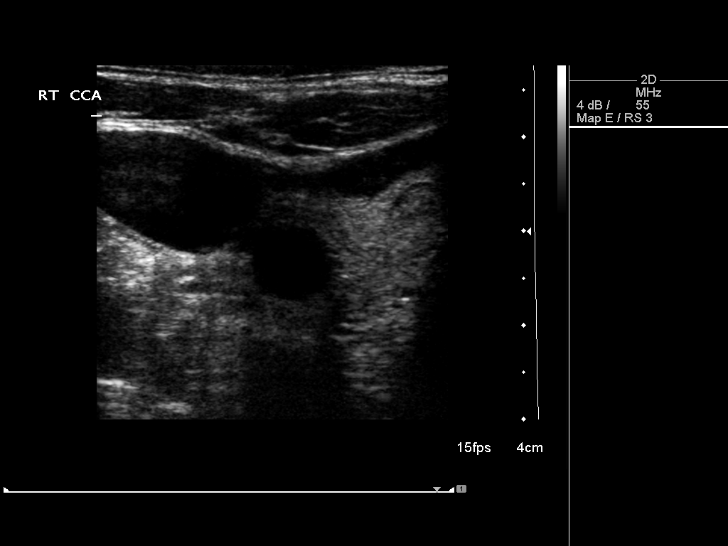
[im 6/61]
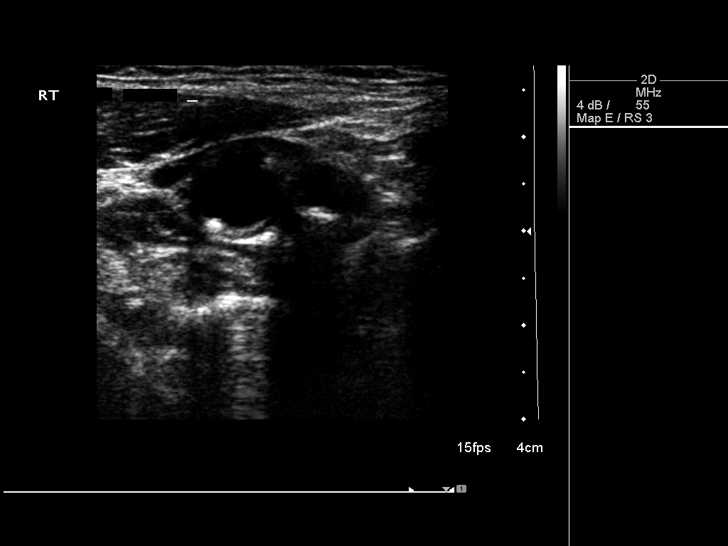
[im 11/61]
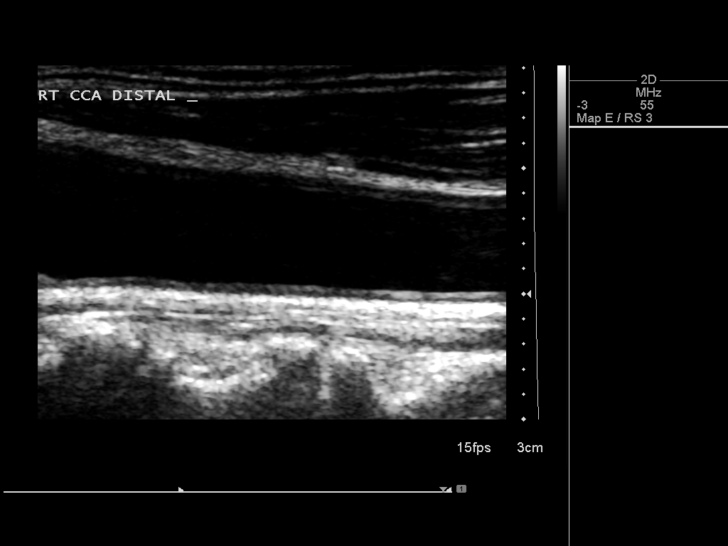
[im 16/61]
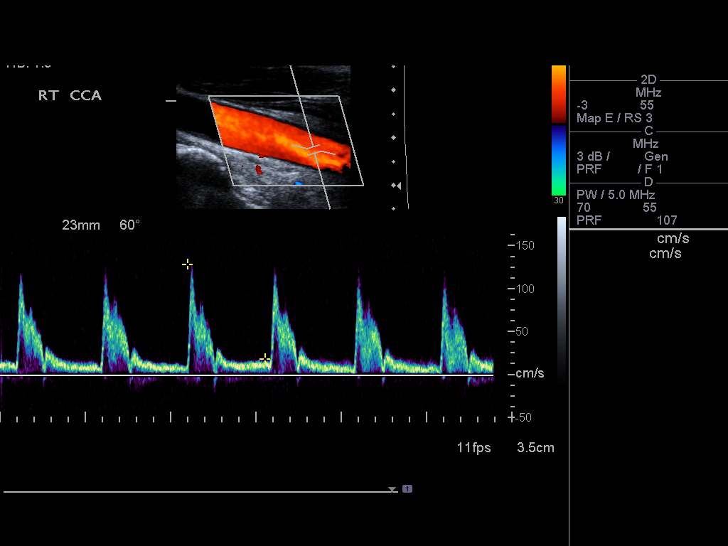
[im 21/61]
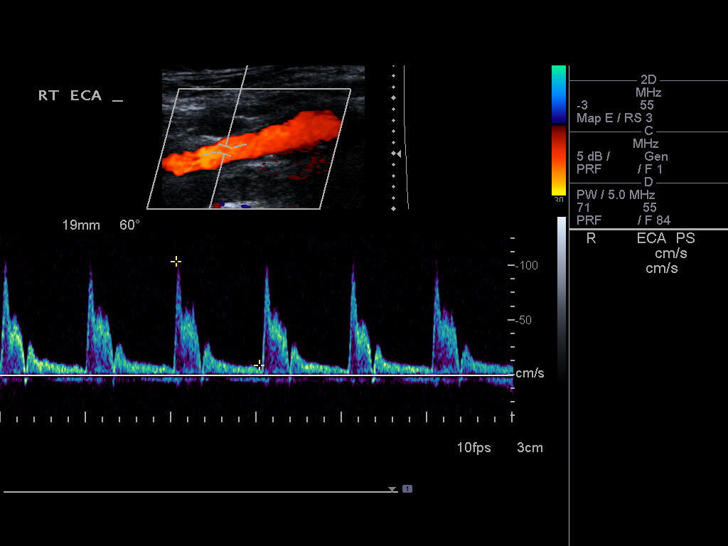
[im 27/61]
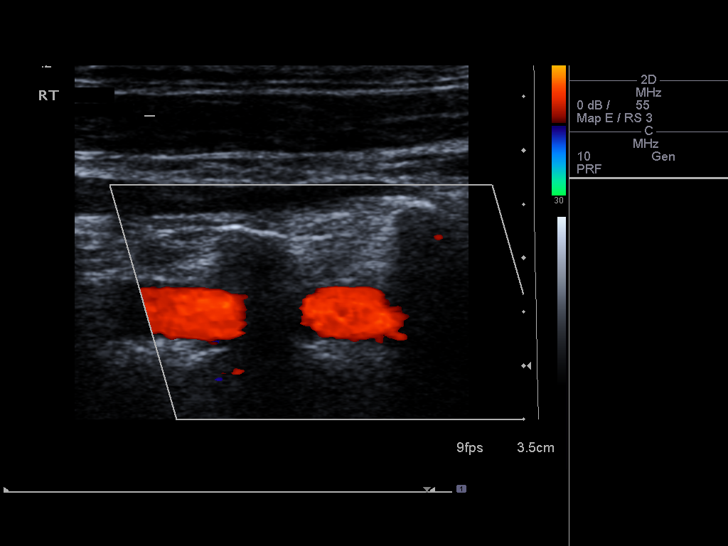
[im 32/61]
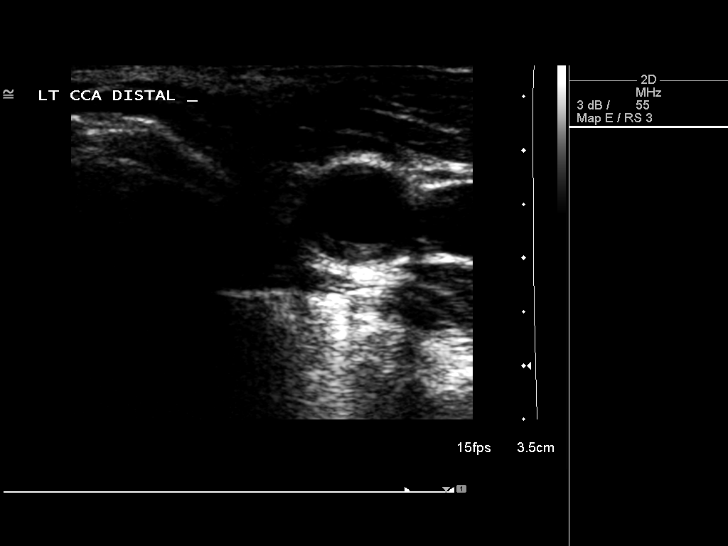
[im 34/61]
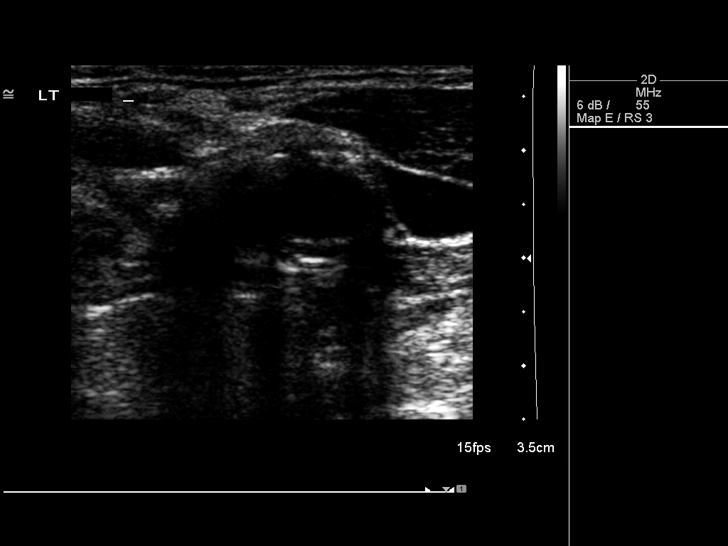
[im 40/61]
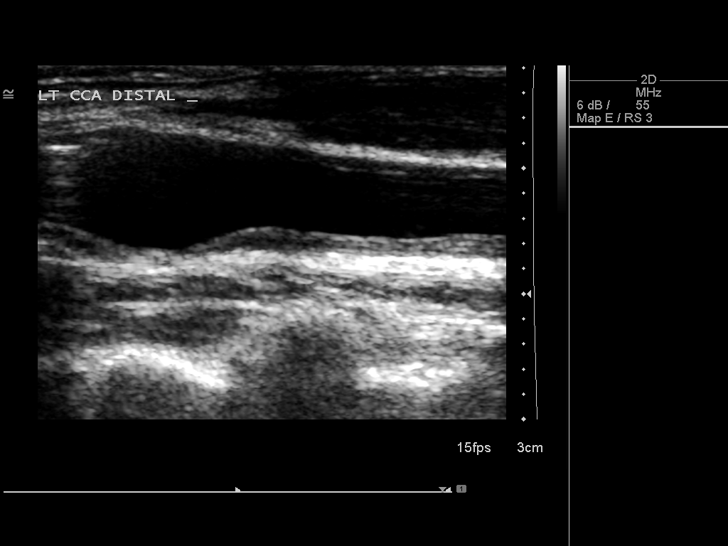
[im 45/61]
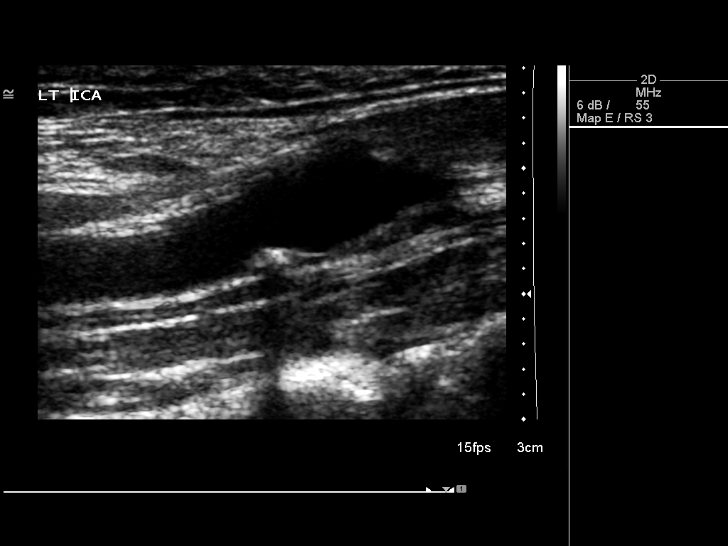
[im 50/61]
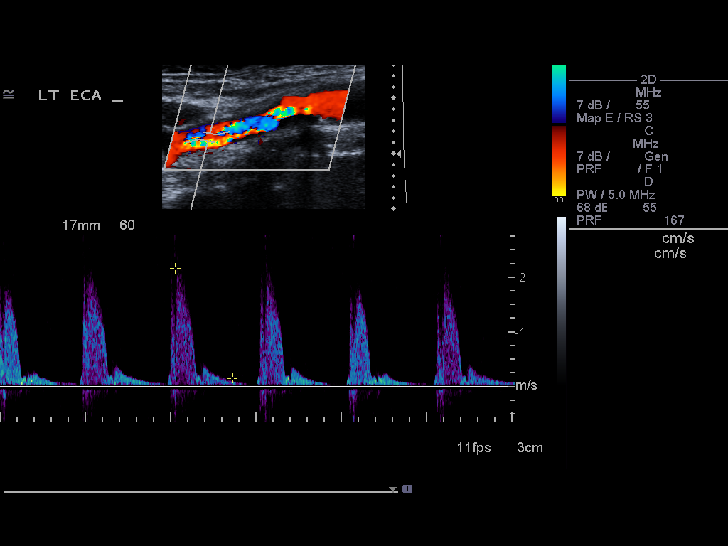
[im 55/61]
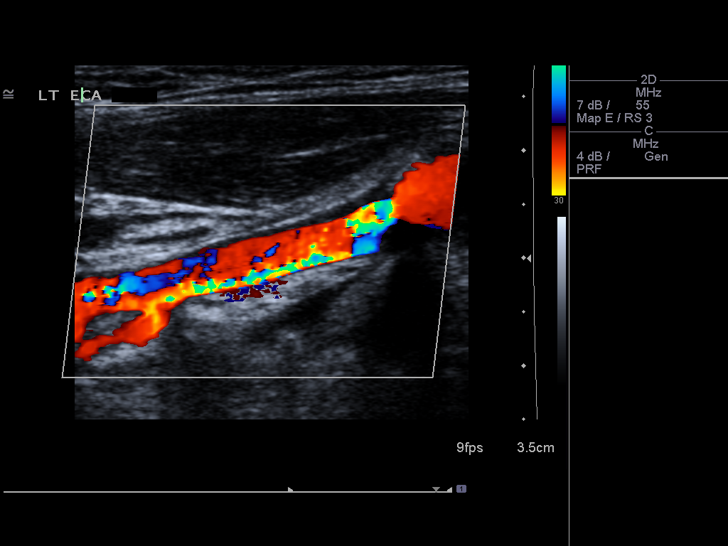
[im 61/61]
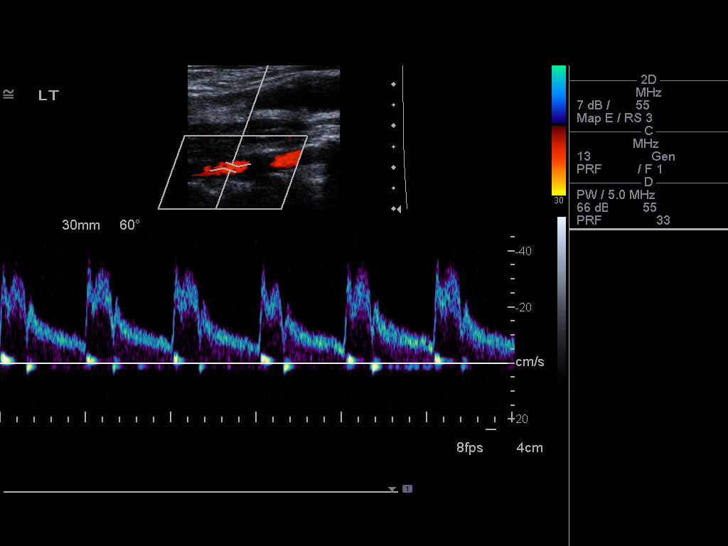

[13 of 24 positions shown; findings below may reference images not displayed]

FINDINGS: Criteria: Quantification of carotid stenosis is based on velocity
parameters that correlate the residual internal carotid diameter
with NASCET-based stenosis levels, using the diameter of the distal
internal carotid lumen as the denominator for stenosis measurement.

The following velocity measurements were obtained:

RIGHT

ICA:  133/35 cm/sec

CCA:  83/14 cm/sec

SYSTOLIC ICA/CCA RATIO:

DIASTOLIC ICA/CCA RATIO:

ECA:  104 cm/sec

LEFT

ICA:  93/30 cm/sec

CCA:  73/14 cm/sec

SYSTOLIC ICA/CCA RATIO:

DIASTOLIC ICA/CCA RATIO:

ECA:  521 cm/sec

RIGHT CAROTID ARTERY: Mild irregular calcified plaque is noted in
the right carotid bulb and proximal right internal carotid artery
which appears to be at the lower end of the 50-69% range based on
peak systolic velocity.

RIGHT VERTEBRAL ARTERY:  Antegrade flow is noted.

LEFT CAROTID ARTERY: Mild calcified irregular plaque is noted in the
left carotid bulb and proximal left internal carotid artery
consistent with less than 50% diameter stenosis.

LEFT VERTEBRAL ARTERY:  Antegrade flow is noted.
IMPRESSION: Mild irregular calcified plaque formation is noted in the proximal
right internal carotid artery at the low end of the 50-69% stenosis
range. Mild irregular calcified plaque is noted in the proximal left
internal carotid artery consistent with less than 50% diameter
stenosis.

## 2014-11-28 DIAGNOSIS — M5116 Intervertebral disc disorders with radiculopathy, lumbar region: Secondary | ICD-10-CM | POA: Diagnosis not present

## 2014-11-28 DIAGNOSIS — M961 Postlaminectomy syndrome, not elsewhere classified: Secondary | ICD-10-CM | POA: Diagnosis not present

## 2015-01-01 DIAGNOSIS — R350 Frequency of micturition: Secondary | ICD-10-CM | POA: Diagnosis not present

## 2015-01-01 DIAGNOSIS — N200 Calculus of kidney: Secondary | ICD-10-CM | POA: Diagnosis not present

## 2015-02-05 DIAGNOSIS — N401 Enlarged prostate with lower urinary tract symptoms: Secondary | ICD-10-CM | POA: Diagnosis not present

## 2015-02-05 DIAGNOSIS — R350 Frequency of micturition: Secondary | ICD-10-CM | POA: Diagnosis not present

## 2015-02-06 DIAGNOSIS — M5116 Intervertebral disc disorders with radiculopathy, lumbar region: Secondary | ICD-10-CM | POA: Diagnosis not present

## 2015-02-06 DIAGNOSIS — M961 Postlaminectomy syndrome, not elsewhere classified: Secondary | ICD-10-CM | POA: Diagnosis not present

## 2015-02-06 DIAGNOSIS — M47816 Spondylosis without myelopathy or radiculopathy, lumbar region: Secondary | ICD-10-CM | POA: Diagnosis not present

## 2015-06-07 IMAGING — CR DG LUMBAR SPINE 1V
1 series · 1 of 1 positions shown · non-contrast
Comparison: Lumbar spine CT scan 04/24/2014.

CLINICAL DATA: L3-L4 and L4-L5 stenosis and herniated nucleus
pulposus.

EXAM:
LUMBAR SPINE - 1 VIEW

[lateral]
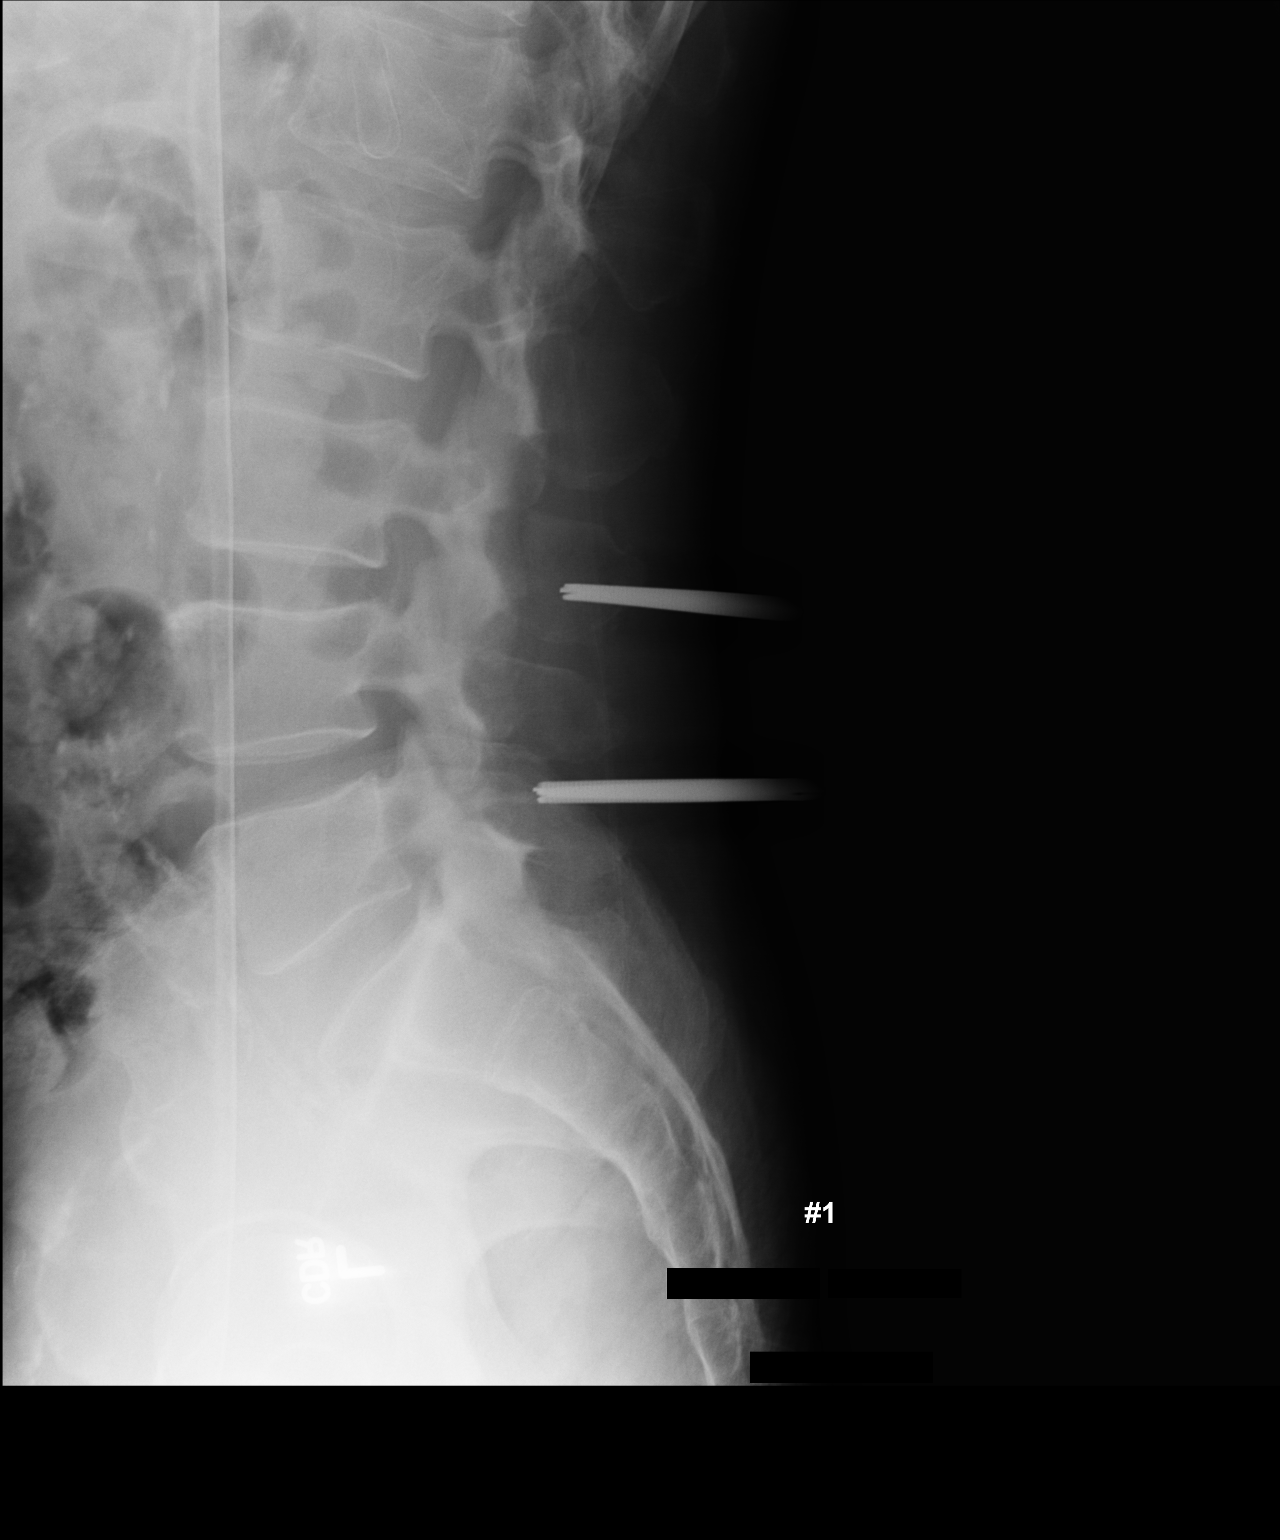

[1 of 1 positions shown; findings below may reference images not displayed]

FINDINGS: A single cross-table lateral view of the lumbar spine demonstrates
surgical probes in place projecting over the L3 spinous process
posterior to the L3-L4 interspace, and over the superior aspect of
the L5 spinous process, posterior to the L4-L5 interspace.
IMPRESSION: 1. Intraoperative localization, as above.

## 2015-06-16 IMAGING — US US EXTREM LOW VENOUS*L*
1 series · 13 of 24 positions shown · non-contrast
Comparison: None.

CLINICAL DATA: Left leg pain and swelling



[Series 1: us extrem low venous*left* · 13 of 30 slices shown]
[im 1/30]
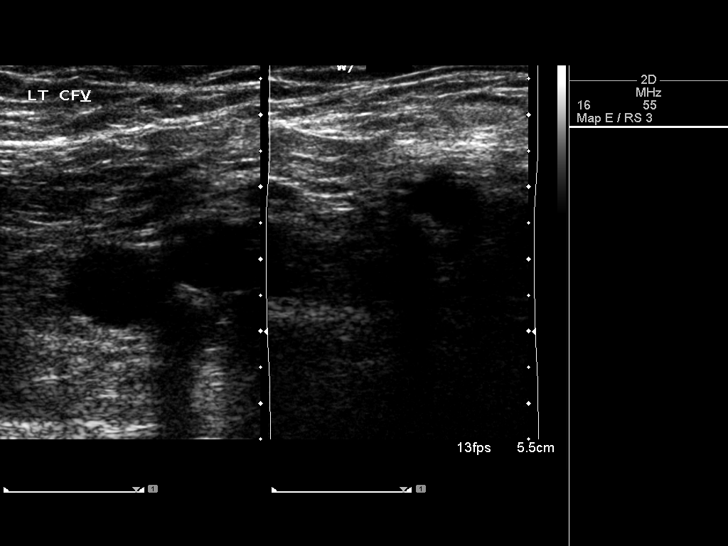
[im 3/30]
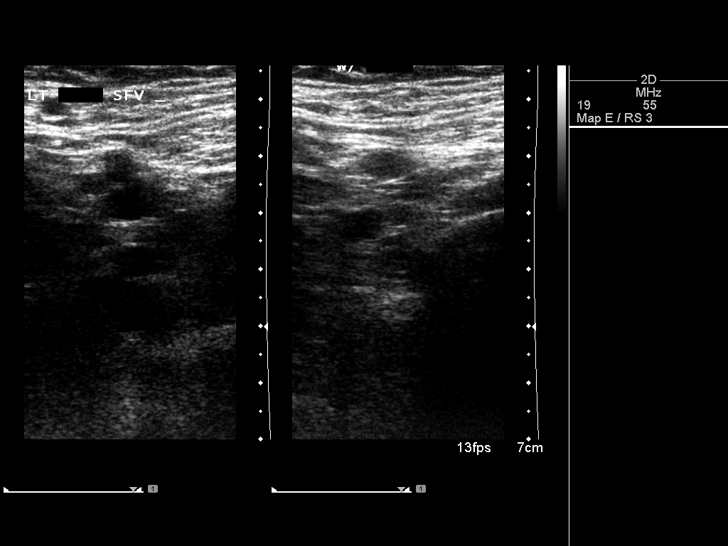
[im 6/30]
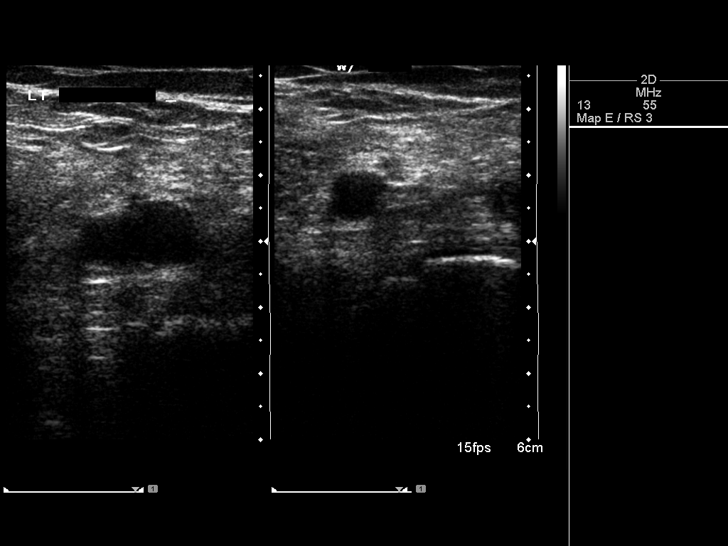
[im 8/30]
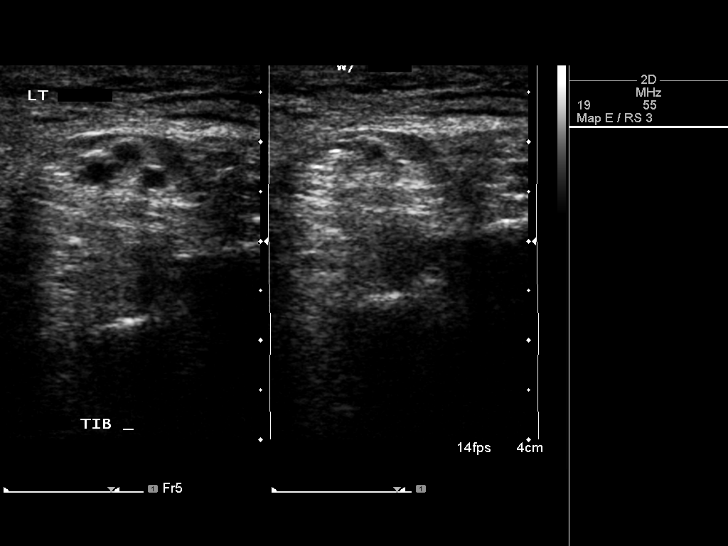
[im 11/30]
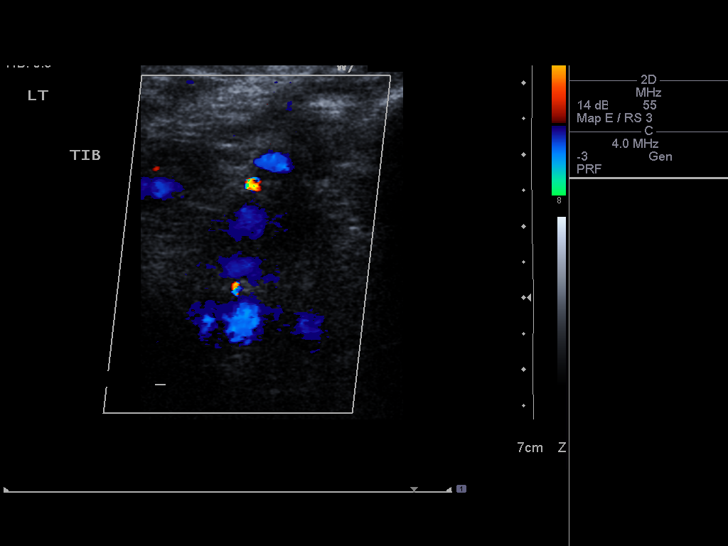
[im 13/30]
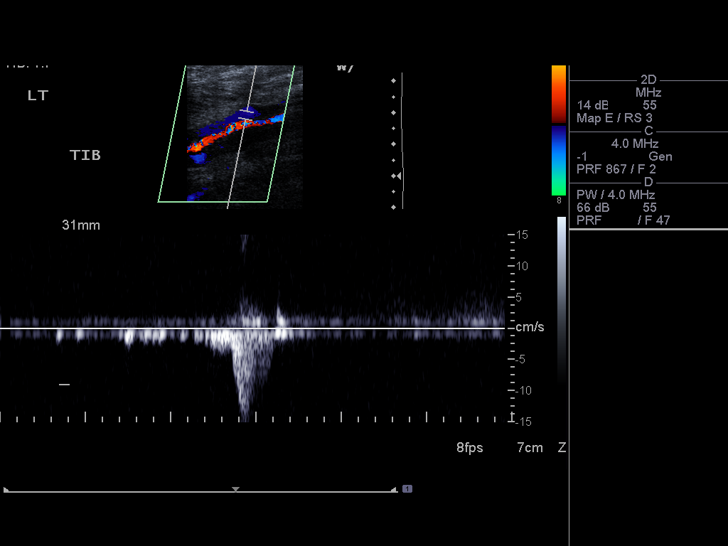
[im 16/30]
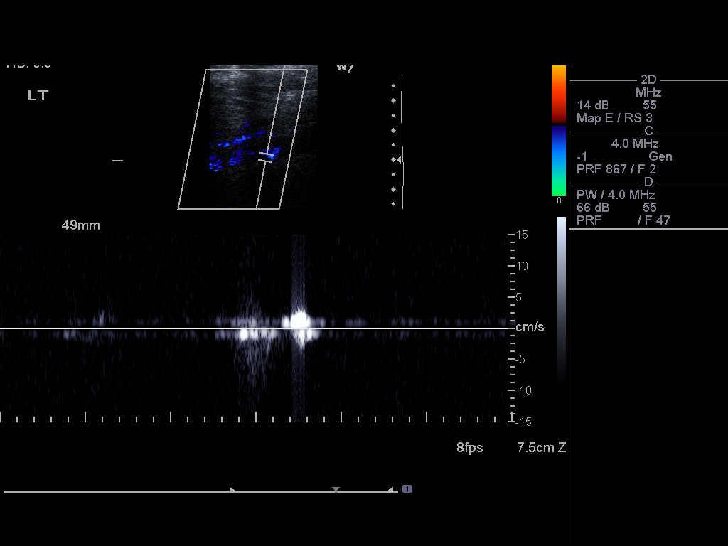
[im 17/30]
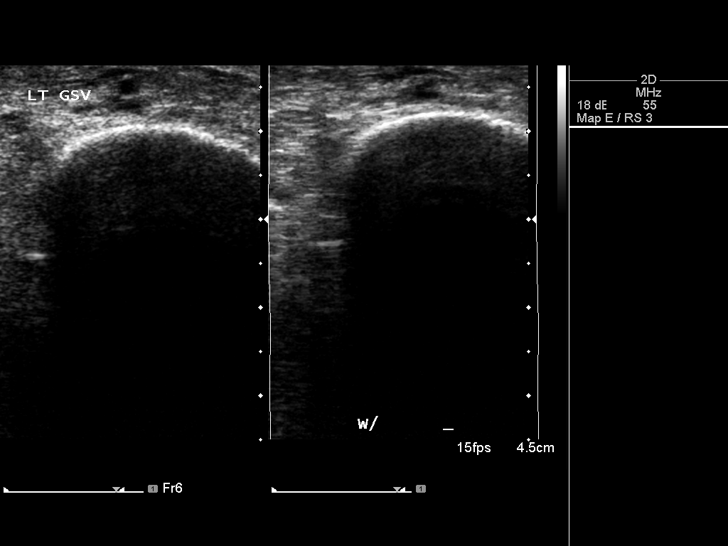
[im 19/30]
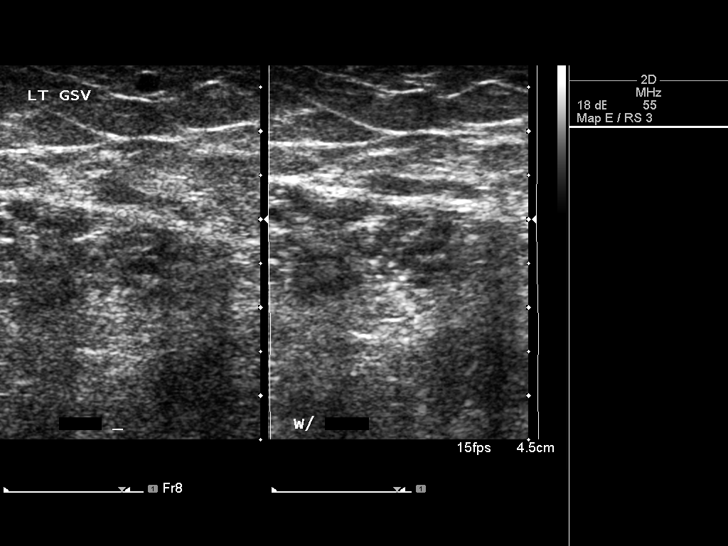
[im 22/30]
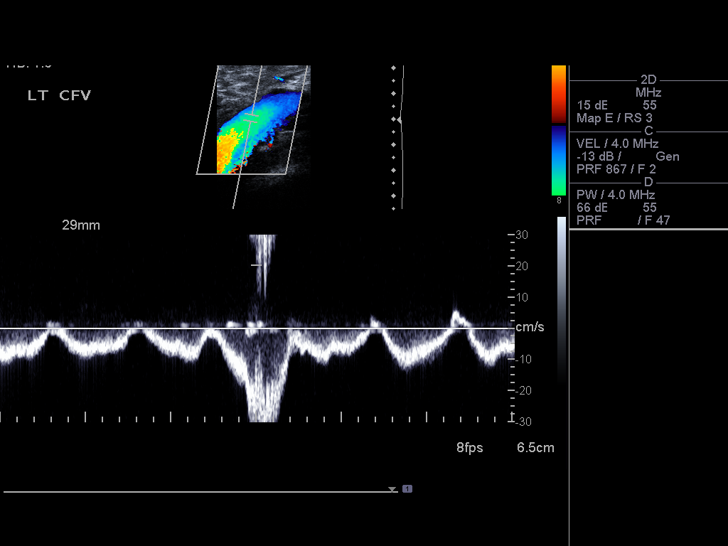
[im 24/30]
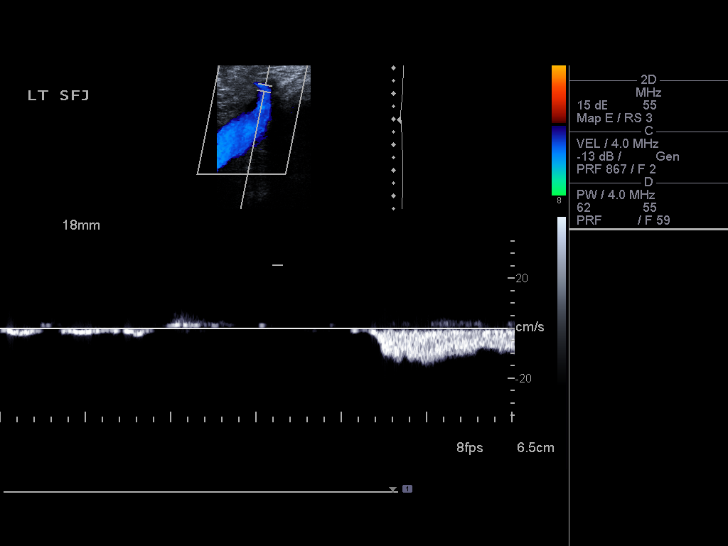
[im 27/30]
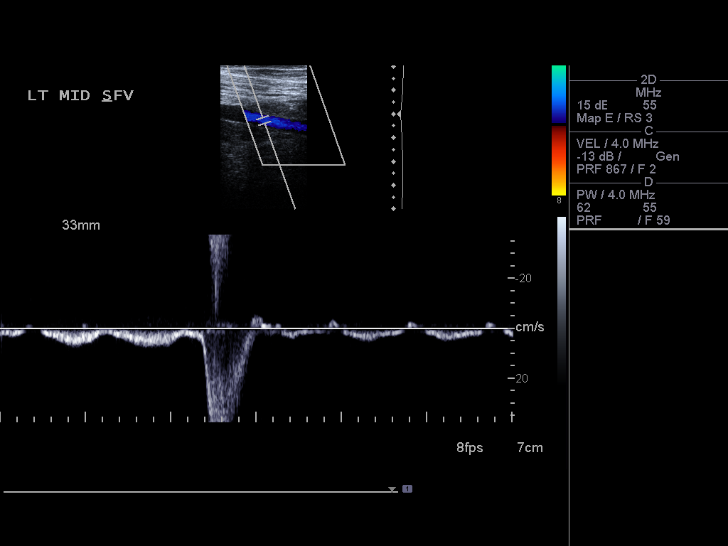
[im 30/30]
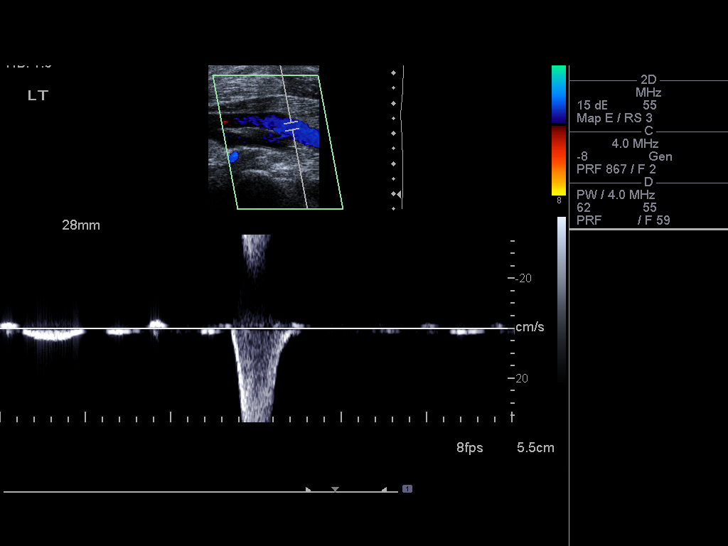

[13 of 24 positions shown; findings below may reference images not displayed]

FINDINGS: Common Femoral Vein: No evidence of thrombus. Normal
compressibility, respiratory phasicity and response to augmentation.

Saphenofemoral Junction: No evidence of thrombus. Normal
compressibility and flow on color Doppler imaging.

Profunda Femoral Vein: No evidence of thrombus. Normal
compressibility and flow on color Doppler imaging.

Femoral Vein: No evidence of thrombus. Normal compressibility,
respiratory phasicity and response to augmentation.

Popliteal Vein: No evidence of thrombus. Normal compressibility,
respiratory phasicity and response to augmentation.

Calf Veins: No evidence of thrombus. Normal compressibility and flow
on color Doppler imaging.

Superficial Great Saphenous Vein: No evidence of thrombus. Normal
compressibility and flow on color Doppler imaging.

Venous Reflux:  None.

Other Findings:  None.
IMPRESSION: No evidence of deep venous thrombosis.

## 2015-07-06 DIAGNOSIS — G2581 Restless legs syndrome: Secondary | ICD-10-CM | POA: Diagnosis not present

## 2015-07-06 DIAGNOSIS — I779 Disorder of arteries and arterioles, unspecified: Secondary | ICD-10-CM | POA: Diagnosis not present

## 2015-07-06 DIAGNOSIS — J449 Chronic obstructive pulmonary disease, unspecified: Secondary | ICD-10-CM | POA: Diagnosis not present

## 2015-07-06 DIAGNOSIS — R899 Unspecified abnormal finding in specimens from other organs, systems and tissues: Secondary | ICD-10-CM | POA: Diagnosis not present

## 2015-07-06 DIAGNOSIS — F17200 Nicotine dependence, unspecified, uncomplicated: Secondary | ICD-10-CM | POA: Diagnosis not present

## 2015-07-06 DIAGNOSIS — N200 Calculus of kidney: Secondary | ICD-10-CM | POA: Diagnosis not present

## 2015-07-06 DIAGNOSIS — Z79899 Other long term (current) drug therapy: Secondary | ICD-10-CM | POA: Diagnosis not present

## 2015-07-06 DIAGNOSIS — K635 Polyp of colon: Secondary | ICD-10-CM | POA: Diagnosis not present

## 2015-07-06 DIAGNOSIS — Z125 Encounter for screening for malignant neoplasm of prostate: Secondary | ICD-10-CM | POA: Diagnosis not present

## 2015-07-06 DIAGNOSIS — Z23 Encounter for immunization: Secondary | ICD-10-CM | POA: Diagnosis not present

## 2015-07-06 DIAGNOSIS — Z136 Encounter for screening for cardiovascular disorders: Secondary | ICD-10-CM | POA: Diagnosis not present

## 2015-08-13 DIAGNOSIS — R350 Frequency of micturition: Secondary | ICD-10-CM | POA: Diagnosis not present

## 2015-08-13 DIAGNOSIS — N139 Obstructive and reflux uropathy, unspecified: Secondary | ICD-10-CM | POA: Diagnosis not present

## 2015-09-26 DIAGNOSIS — M5116 Intervertebral disc disorders with radiculopathy, lumbar region: Secondary | ICD-10-CM | POA: Diagnosis not present

## 2015-09-26 DIAGNOSIS — M961 Postlaminectomy syndrome, not elsewhere classified: Secondary | ICD-10-CM | POA: Diagnosis not present

## 2015-09-26 DIAGNOSIS — M47816 Spondylosis without myelopathy or radiculopathy, lumbar region: Secondary | ICD-10-CM | POA: Diagnosis not present

## 2016-01-11 ENCOUNTER — Other Ambulatory Visit: Payer: Self-pay | Admitting: Physical Medicine and Rehabilitation

## 2016-01-11 DIAGNOSIS — M545 Low back pain: Secondary | ICD-10-CM

## 2016-01-18 ENCOUNTER — Ambulatory Visit
Admission: RE | Admit: 2016-01-18 | Discharge: 2016-01-18 | Disposition: A | Discharge status: Home / Self Care | Payer: Medicare Other | Source: Ambulatory Visit | Attending: Physical Medicine and Rehabilitation | Admitting: Physical Medicine and Rehabilitation

## 2016-01-18 DIAGNOSIS — M545 Low back pain: Secondary | ICD-10-CM | POA: Diagnosis not present

## 2016-01-29 DIAGNOSIS — M5116 Intervertebral disc disorders with radiculopathy, lumbar region: Secondary | ICD-10-CM | POA: Diagnosis not present

## 2016-01-29 DIAGNOSIS — M47816 Spondylosis without myelopathy or radiculopathy, lumbar region: Secondary | ICD-10-CM | POA: Diagnosis not present

## 2016-01-29 DIAGNOSIS — M961 Postlaminectomy syndrome, not elsewhere classified: Secondary | ICD-10-CM | POA: Diagnosis not present

## 2016-02-11 DIAGNOSIS — M47816 Spondylosis without myelopathy or radiculopathy, lumbar region: Secondary | ICD-10-CM | POA: Diagnosis not present

## 2016-02-11 DIAGNOSIS — M5116 Intervertebral disc disorders with radiculopathy, lumbar region: Secondary | ICD-10-CM | POA: Diagnosis not present

## 2016-02-11 DIAGNOSIS — M961 Postlaminectomy syndrome, not elsewhere classified: Secondary | ICD-10-CM | POA: Diagnosis not present

## 2016-03-20 DIAGNOSIS — M47816 Spondylosis without myelopathy or radiculopathy, lumbar region: Secondary | ICD-10-CM | POA: Diagnosis not present

## 2016-03-26 DIAGNOSIS — M47816 Spondylosis without myelopathy or radiculopathy, lumbar region: Secondary | ICD-10-CM | POA: Diagnosis not present

## 2016-04-24 DIAGNOSIS — M5116 Intervertebral disc disorders with radiculopathy, lumbar region: Secondary | ICD-10-CM | POA: Diagnosis not present

## 2016-04-24 DIAGNOSIS — M961 Postlaminectomy syndrome, not elsewhere classified: Secondary | ICD-10-CM | POA: Diagnosis not present

## 2016-04-24 DIAGNOSIS — M47816 Spondylosis without myelopathy or radiculopathy, lumbar region: Secondary | ICD-10-CM | POA: Diagnosis not present

## 2016-05-22 DIAGNOSIS — M5116 Intervertebral disc disorders with radiculopathy, lumbar region: Secondary | ICD-10-CM | POA: Diagnosis not present

## 2016-05-22 DIAGNOSIS — M961 Postlaminectomy syndrome, not elsewhere classified: Secondary | ICD-10-CM | POA: Diagnosis not present

## 2016-05-22 DIAGNOSIS — M47816 Spondylosis without myelopathy or radiculopathy, lumbar region: Secondary | ICD-10-CM | POA: Diagnosis not present

## 2016-05-28 DIAGNOSIS — M792 Neuralgia and neuritis, unspecified: Secondary | ICD-10-CM | POA: Diagnosis not present

## 2016-06-19 DIAGNOSIS — M5116 Intervertebral disc disorders with radiculopathy, lumbar region: Secondary | ICD-10-CM | POA: Diagnosis not present

## 2016-06-19 DIAGNOSIS — M961 Postlaminectomy syndrome, not elsewhere classified: Secondary | ICD-10-CM | POA: Diagnosis not present

## 2016-07-31 DIAGNOSIS — Z79899 Other long term (current) drug therapy: Secondary | ICD-10-CM | POA: Diagnosis not present

## 2016-07-31 DIAGNOSIS — Z0000 Encounter for general adult medical examination without abnormal findings: Secondary | ICD-10-CM | POA: Diagnosis not present

## 2016-07-31 DIAGNOSIS — N401 Enlarged prostate with lower urinary tract symptoms: Secondary | ICD-10-CM | POA: Diagnosis not present

## 2016-07-31 DIAGNOSIS — J449 Chronic obstructive pulmonary disease, unspecified: Secondary | ICD-10-CM | POA: Diagnosis not present

## 2016-07-31 DIAGNOSIS — Z86010 Personal history of colonic polyps: Secondary | ICD-10-CM | POA: Diagnosis not present

## 2016-07-31 DIAGNOSIS — I714 Abdominal aortic aneurysm, without rupture: Secondary | ICD-10-CM | POA: Diagnosis not present

## 2016-07-31 DIAGNOSIS — E785 Hyperlipidemia, unspecified: Secondary | ICD-10-CM | POA: Diagnosis not present

## 2016-07-31 DIAGNOSIS — Z125 Encounter for screening for malignant neoplasm of prostate: Secondary | ICD-10-CM | POA: Diagnosis not present

## 2016-07-31 DIAGNOSIS — G2581 Restless legs syndrome: Secondary | ICD-10-CM | POA: Diagnosis not present

## 2016-07-31 DIAGNOSIS — F17200 Nicotine dependence, unspecified, uncomplicated: Secondary | ICD-10-CM | POA: Diagnosis not present

## 2016-07-31 DIAGNOSIS — I779 Disorder of arteries and arterioles, unspecified: Secondary | ICD-10-CM | POA: Diagnosis not present

## 2016-08-01 ENCOUNTER — Other Ambulatory Visit: Payer: Self-pay | Admitting: Family Medicine

## 2016-08-01 DIAGNOSIS — I739 Peripheral vascular disease, unspecified: Principal | ICD-10-CM

## 2016-08-01 DIAGNOSIS — I779 Disorder of arteries and arterioles, unspecified: Secondary | ICD-10-CM

## 2016-08-12 DIAGNOSIS — N401 Enlarged prostate with lower urinary tract symptoms: Secondary | ICD-10-CM | POA: Diagnosis not present

## 2016-08-12 DIAGNOSIS — R3915 Urgency of urination: Secondary | ICD-10-CM | POA: Diagnosis not present

## 2016-08-12 DIAGNOSIS — R351 Nocturia: Secondary | ICD-10-CM | POA: Diagnosis not present

## 2016-08-14 ENCOUNTER — Ambulatory Visit
Admission: RE | Admit: 2016-08-14 | Discharge: 2016-08-14 | Disposition: A | Discharge status: Home / Self Care | Payer: Medicare Other | Source: Ambulatory Visit | Attending: Family Medicine | Admitting: Family Medicine

## 2016-08-14 DIAGNOSIS — I739 Peripheral vascular disease, unspecified: Principal | ICD-10-CM

## 2016-08-14 DIAGNOSIS — I779 Disorder of arteries and arterioles, unspecified: Secondary | ICD-10-CM

## 2016-08-14 DIAGNOSIS — I6523 Occlusion and stenosis of bilateral carotid arteries: Secondary | ICD-10-CM | POA: Diagnosis not present

## 2016-08-18 ENCOUNTER — Other Ambulatory Visit (INDEPENDENT_AMBULATORY_CARE_PROVIDER_SITE_OTHER): Payer: Self-pay | Admitting: Physical Medicine and Rehabilitation

## 2016-08-21 ENCOUNTER — Telehealth (INDEPENDENT_AMBULATORY_CARE_PROVIDER_SITE_OTHER): Payer: Self-pay | Admitting: Physical Medicine and Rehabilitation

## 2016-08-21 MED ORDER — TRAMADOL HCL 50 MG PO TABS: 100.0000 mg | ORAL_TABLET | Freq: Two times a day (BID) | ORAL | 1 refills | Status: DC | PRN

## 2016-08-21 NOTE — Telephone Encounter (Signed)
I think I reordered this med but it will not let me fax so I marked "phone in" please look at meds and phone in

## 2016-08-21 NOTE — Telephone Encounter (Signed)
Prescription called into patient's pharmacy.

## 2016-09-12 DIAGNOSIS — Z23 Encounter for immunization: Secondary | ICD-10-CM | POA: Diagnosis not present

## 2016-09-17 DIAGNOSIS — G2581 Restless legs syndrome: Secondary | ICD-10-CM | POA: Diagnosis not present

## 2016-09-17 DIAGNOSIS — I10 Essential (primary) hypertension: Secondary | ICD-10-CM | POA: Diagnosis not present

## 2016-09-17 DIAGNOSIS — R0789 Other chest pain: Secondary | ICD-10-CM | POA: Diagnosis not present

## 2016-10-01 ENCOUNTER — Telehealth (INDEPENDENT_AMBULATORY_CARE_PROVIDER_SITE_OTHER): Payer: Self-pay | Admitting: Physical Medicine and Rehabilitation

## 2016-10-01 NOTE — Telephone Encounter (Signed)
Yes ok 

## 2016-10-01 NOTE — Telephone Encounter (Signed)
No precert required for medicare and bcbs supplment

## 2016-10-02 ENCOUNTER — Ambulatory Visit (INDEPENDENT_AMBULATORY_CARE_PROVIDER_SITE_OTHER): Payer: Medicare Other | Admitting: Physical Medicine and Rehabilitation

## 2016-10-02 ENCOUNTER — Encounter (INDEPENDENT_AMBULATORY_CARE_PROVIDER_SITE_OTHER): Payer: Self-pay | Admitting: Physical Medicine and Rehabilitation

## 2016-10-02 VITALS — BP 151/75 | HR 71

## 2016-10-02 DIAGNOSIS — M48061 Spinal stenosis, lumbar region without neurogenic claudication: Secondary | ICD-10-CM

## 2016-10-02 DIAGNOSIS — M961 Postlaminectomy syndrome, not elsewhere classified: Secondary | ICD-10-CM

## 2016-10-02 MED ORDER — LIDOCAINE HCL (PF) 1 % IJ SOLN: 0.3300 mL | Freq: Once | INTRAMUSCULAR | Status: DC

## 2016-10-02 MED ORDER — METHYLPREDNISOLONE ACETATE 80 MG/ML IJ SUSP
80.0000 mg | Freq: Once | INTRAMUSCULAR | Status: AC
  Administered 2016-10-02: 80 mg

## 2016-10-02 NOTE — Procedures (Signed)
Lumbosacral Transforaminal Epidural Steroid Injection - Infraneural Approach with Fluoroscopic Guidance  Patient: Randall [name]      Date of Birth: September 09, 1946 MRN: [id number] PCP: [name], MD      Visit Date: 10/02/2016   Universal Protocol:    Date/Time: 12/07/178:51 AM  Consent Given By: the patient  Position: PRONE   Additional Comments: Vital signs were monitored before and after the procedure. Patient was prepped and draped in the usual sterile fashion. The correct patient, procedure, and site was verified.   Injection Procedure Details:  Procedure Site One Meds Administered:  Meds ordered this encounter  Medications  . lidocaine (PF) (XYLOCAINE) 1 % injection 0.3 mL  . methylPREDNISolone acetate (DEPO-MEDROL) injection 80 mg      Laterality: Bilateral  Location/Site:  L3-L4  Needle size: 22 G  Needle type: Spinal  Needle Placement: Transforaminal  Findings:  -Contrast Used: 1 mL iohexol 180 mg iodine/mL   -Comments: Excellent flow of contrast along the nerve and into the epidural space.  Procedure Details: After squaring off the end-plates of the desired vertebral level to get a true AP view, the C-arm was obliqued to the painful side so that the superior articulating process is positioned about 1/3 the length of the inferior endplate.  The needle was aimed toward the junction of the superior articular process and the transverse process of the inferior vertebrae. The needle's initial entry is in the lower third of the foramen through Kambin's triangle. The soft tissues overlying this target were infiltrated with 2-3 ml. of 1% Lidocaine without Epinephrine.  The spinal needle was then inserted and advanced toward the target using a "trajectory" view along the fluoroscope beam.  Under AP and lateral visualization, the needle was advanced so it did not puncture dura and did not traverse medially beyond the 6 o'clock position of the pedicle. Bi-planar  projections were used to confirm position. Aspiration was confirmed to be negative for CSF and/or blood. A 1-2 ml. volume of Isovue-250 was injected and flow of contrast was noted at each level. Radiographs were obtained for documentation purposes.   After attaining the desired flow of contrast documented above, a 0.5 to 1.0 ml test dose of 0.25% Marcaine was injected into each respective transforaminal space.  The patient was observed for 90 seconds post injection.  After no sensory deficits were reported, and normal lower extremity motor function was noted,   the above injectate was administered so that equal amounts of the injectate were placed at each foramen (level) into the transforaminal epidural space.   Additional Comments:  The patient tolerated the procedure well Dressing: Band-Aid    Post-procedure details: Patient was observed during the procedure. Post-procedure instructions were reviewed.  Patient left the clinic in stable condition.

## 2016-10-02 NOTE — Patient Instructions (Signed)

## 2016-10-02 NOTE — Progress Notes (Signed)
Randall [name] - 70 y.o. male MRN [id number]  Date of birth: [birth year]  Office Visit Note: Visit Date: 10/02/2016 PCP: [name], MD Referred by: Hulan Fess, MD  Subjective: Chief Complaint  Patient presents with  . Lower Back - Pain   HPI: [name] is a 70 year old gentleman is well-known to me. He's had microdiscectomy laminectomy by Dr. Lorin Mercy a few years ago. He still had ongoing back pain. Has been difficult to treat in the sense that were unsure if his facet mediated versus discogenic versus a combination of all at with mechanical back pain. It is worse in the morning with movement and then he gets going he does better. Is worse with ambulation. He has had radiofrequency ablation with some relief but he still gets this episodic pain. The last time I saw him we completed transforaminal injections at L3 and he actually did quite well for some time. He continues to take tramadol. Did well with last injection. Increased pain last several weeks. Across back. Worse on right side. Denies pain down legs.    ROS Otherwise per HPI.  Assessment & Plan: Visit Diagnoses:  1. Post laminectomy syndrome   2. Spinal stenosis of lumbar region without neurogenic claudication     Plan: Findings:  Bilateral L3 transforaminal epidural steroid injection fluoroscopic guidance. Patient's wife is having her hip replaced tomorrow by Dr. Ninfa [name] and hopefully this will give him some relief over the holidays. We should look at spinal cord stimulation trial at some point.    Meds & Orders:  Meds ordered this encounter  Medications  . lidocaine (PF) (XYLOCAINE) 1 % injection 0.3 mL  . methylPREDNISolone acetate (DEPO-MEDROL) injection 80 mg    Orders Placed This Encounter  Procedures  . Epidural Steroid injection    Follow-up: Return if symptoms worsen or fail to improve after 2 weeks.   Procedures: No procedures performed  Lumbosacral Transforaminal Epidural Steroid Injection -  Infraneural Approach with Fluoroscopic Guidance  Patient: Randall [name]      Date of Birth: 08/24/1946 MRN: [id number] PCP: [name], MD      Visit Date: 10/02/2016   Universal Protocol:    Date/Time: 12/07/178:51 AM  Consent Given By: the patient  Position: PRONE   Additional Comments: Vital signs were monitored before and after the procedure. Patient was prepped and draped in the usual sterile fashion. The correct patient, procedure, and site was verified.   Injection Procedure Details:  Procedure Site One Meds Administered:  Meds ordered this encounter  Medications  . lidocaine (PF) (XYLOCAINE) 1 % injection 0.3 mL  . methylPREDNISolone acetate (DEPO-MEDROL) injection 80 mg      Laterality: Bilateral  Location/Site:  L3-L4  Needle size: 22 G  Needle type: Spinal  Needle Placement: Transforaminal  Findings:  -Contrast Used: 1 mL iohexol 180 mg iodine/mL   -Comments: Excellent flow of contrast along the nerve and into the epidural space.  Procedure Details: After squaring off the end-plates of the desired vertebral level to get a true AP view, the C-arm was obliqued to the painful side so that the superior articulating process is positioned about 1/3 the length of the inferior endplate.  The needle was aimed toward the junction of the superior articular process and the transverse process of the inferior vertebrae. The needle's initial entry is in the lower third of the foramen through Kambin's triangle. The soft tissues overlying this target were infiltrated with 2-3 ml. of 1% Lidocaine without Epinephrine.  The  spinal needle was then inserted and advanced toward the target using a "trajectory" view along the fluoroscope beam.  Under AP and lateral visualization, the needle was advanced so it did not puncture dura and did not traverse medially beyond the 6 o'clock position of the pedicle. Bi-planar projections were used to confirm position. Aspiration was  confirmed to be negative for CSF and/or blood. A 1-2 ml. volume of Isovue-250 was injected and flow of contrast was noted at each level. Radiographs were obtained for documentation purposes.   After attaining the desired flow of contrast documented above, a 0.5 to 1.0 ml test dose of 0.25% Marcaine was injected into each respective transforaminal space.  The patient was observed for 90 seconds post injection.  After no sensory deficits were reported, and normal lower extremity motor function was noted,   the above injectate was administered so that equal amounts of the injectate were placed at each foramen (level) into the transforaminal epidural space.   Additional Comments:  The patient tolerated the procedure well Dressing: Band-Aid    Post-procedure details: Patient was observed during the procedure. Post-procedure instructions were reviewed.  Patient left the clinic in stable condition.   Clinical History: No specialty comments available.  He reports that he has been smoking.  He has a 20.00 pack-year smoking history. He does not have any smokeless tobacco history on file. No results for input(s): HGBA1C, LABURIC in the last 8760 hours.  Objective:  VS:  HT:    WT:   BMI:     BP:(!) 151/75  HR:71bpm  TEMP: ( )  RESP:97 % Physical Exam  Ortho Exam Imaging: No results found.  Past Medical/Family/Surgical/Social History: Medications & Allergies reviewed per EMR Patient Active Problem List   Diagnosis Date Noted  . HNP (herniated nucleus pulposus), lumbar 05/15/2014  . S/P cervical spinal fusion 12/19/2011    Class: Diagnosis of  . 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 Kidney stones    1 removed by litho. 1 by Cysto  . RLS (restless legs syndrome)    Family History  Problem Relation Age of Onset  . Anesthesia problems Neg Hx    Past Surgical History:  Procedure Laterality Date  .  Cervial  May 2012   4-5, 6-7  . LUMBAR LAMINECTOMY N/A 05/15/2014   Procedure: MICRODISCECTOMY LUMBAR LAMINECTOMY;  Surgeon: Randall Killings, MD;  Location: [facility];  Service: Orthopedics;  Laterality: N/A;  L3-4, L4-5 Decompression, Left L3-4, L4-5 Microdiscectomy  . POSTERIOR CERVICAL FUSION/FORAMINOTOMY  12/19/2011   Procedure: POSTERIOR CERVICAL FUSION/FORAMINOTOMY LEVEL 2;  Surgeon: Randall Killings, MD;  Location: [facility];  Service: Orthopedics;  Laterality: N/A;  Posterior Cervical Fusion, C4-5, C6-7 Wiring, Vitoss, Iliac aspirate  . TONSILLECTOMY     Social History   Occupational History  . Not on file.   Social History Main Topics  . Smoking status: Current Every Day Smoker    Packs/day: 0.50    Years: 40.00  . Smokeless tobacco: Not on file  . Alcohol use Yes     Comment: occ  . Drug use: No  . Sexual activity: Not on file

## 2016-10-23 ENCOUNTER — Other Ambulatory Visit (INDEPENDENT_AMBULATORY_CARE_PROVIDER_SITE_OTHER): Payer: Self-pay | Admitting: Physical Medicine and Rehabilitation

## 2016-10-28 ENCOUNTER — Telehealth (INDEPENDENT_AMBULATORY_CARE_PROVIDER_SITE_OTHER): Payer: Self-pay | Admitting: Physical Medicine and Rehabilitation

## 2016-10-28 MED ORDER — TRAMADOL HCL 50 MG PO TABS: 100.0000 mg | ORAL_TABLET | Freq: Two times a day (BID) | ORAL | 1 refills | Status: DC | PRN

## 2016-10-28 NOTE — Telephone Encounter (Signed)
Called in to patient's pharmacy and notified patient.

## 2016-10-28 NOTE — Telephone Encounter (Signed)
Refilled printed

## 2016-11-14 ENCOUNTER — Other Ambulatory Visit: Payer: Self-pay | Admitting: *Deleted

## 2016-11-14 DIAGNOSIS — Z87891 Personal history of nicotine dependence: Secondary | ICD-10-CM

## 2016-11-21 ENCOUNTER — Ambulatory Visit
Admission: RE | Admit: 2016-11-21 | Discharge: 2016-11-21 | Disposition: A | Discharge status: Home / Self Care | Payer: Medicare Other | Source: Ambulatory Visit | Attending: Acute Care | Admitting: Acute Care

## 2016-11-21 ENCOUNTER — Ambulatory Visit (INDEPENDENT_AMBULATORY_CARE_PROVIDER_SITE_OTHER): Payer: Medicare Other | Admitting: Acute Care

## 2016-11-21 ENCOUNTER — Encounter: Payer: Self-pay | Admitting: Acute Care

## 2016-11-21 DIAGNOSIS — Z87891 Personal history of nicotine dependence: Secondary | ICD-10-CM

## 2016-11-21 NOTE — Progress Notes (Signed)
Shared Decision Making Visit Lung Cancer Screening Program [phone])   Eligibility:  Age 71 y.o.  Pack Years Smoking History Calculation 40 pack year (# packs/per year x # years smoked)  Recent History of coughing up blood  no  Unexplained weight loss? no ( >Than 15 pounds within the last 6 months )  Prior History Lung / other cancer no (Diagnosis within the last 5 years already requiring surveillance chest CT Scans).  Smoking Status Former Smoker  Former Smokers: Years since quit: < 1 year  Quit Date: 09/2016  Visit Components:  Discussion included one or more decision making aids. yes  Discussion included risk/benefits of screening. yes  Discussion included potential follow up diagnostic testing for abnormal scans. yes  Discussion included meaning and risk of over diagnosis. yes  Discussion included meaning and risk of False Positives. yes  Discussion included meaning of total radiation exposure. yes  Counseling Included:  Importance of adherence to annual lung cancer LDCT screening. yes  Impact of comorbidities on ability to participate in the program. yes  Ability and willingness to under diagnostic treatment. yes  Smoking Cessation Counseling:  Current Smokers:   Discussed importance of smoking cessation. yes  Information about tobacco cessation classes and interventions provided to patient. yes  Patient provided with "ticket" for LDCT Scan. yes  Symptomatic Patient. no  Counseling  Diagnosis Code: Tobacco Use Z72.0  Asymptomatic Patient yes  Counseling (Intermediate counseling: > three minutes counseling) [id number]  Former Smokers:   Discussed the importance of maintaining cigarette abstinence. yes  Diagnosis Code: Personal History of Nicotine Dependence. [id number]  Information about tobacco cessation classes and interventions provided to patient. Yes  Patient provided with "ticket" for LDCT Scan. yes  Written Order for Lung Cancer Screening  with LDCT placed in Epic. Yes (CT Chest Lung Cancer Screening Low Dose W/O CM) [id number] Z12.2-Screening of respiratory organs Z87.891-Personal history of nicotine dependence  I spent 20 minutes of face to face time with Mr. Coffing discussing the risks and benefits of lung cancer screening. We viewed a power point together that explained in detail the above noted topics. We took the time to pause the power point at intervals to allow for questions to be asked and answered to ensure understanding. We discussed that he had taken the single most powerful action possible to decrease his risk of developing lung cancer when he quit smoking. I counseled him to remain smoke free, and to contact me if he ever had the desire to smoke again so that I can provide resources and tools to help support the effort to remain smoke free. We discussed the time and location of the scan, and that either [place] or I will call with the results within  24-48 hours of receiving them. He has my card and contact information in the event he needs to speak with me, in addition to a copy of the power point we reviewed as a resource. He verbalized understanding of all of the above and had no further questions upon leaving the office.   We discussed that there is a high incidence of CAD noted with this screening exam.I explained that as a non-gated exam degree or severity of disease cannot be determined, just presence. Mr. Baldassare told me he is currently having his cholesterol and triglycerides monitored by his PCP. He is currently being treated with statin medications. He verbalized understanding of the above.  I spent 4 minutes in smoking cessation counseling with Mr. Oakley.  Judson Roch  Harle Battiest, NP  11/21/2016

## 2016-11-25 ENCOUNTER — Other Ambulatory Visit: Payer: Self-pay | Admitting: Acute Care

## 2016-11-25 DIAGNOSIS — Z87891 Personal history of nicotine dependence: Secondary | ICD-10-CM

## 2016-12-10 ENCOUNTER — Ambulatory Visit (INDEPENDENT_AMBULATORY_CARE_PROVIDER_SITE_OTHER): Payer: Medicare Other

## 2016-12-10 ENCOUNTER — Encounter (INDEPENDENT_AMBULATORY_CARE_PROVIDER_SITE_OTHER): Payer: Self-pay | Admitting: Physical Medicine and Rehabilitation

## 2016-12-10 ENCOUNTER — Ambulatory Visit (INDEPENDENT_AMBULATORY_CARE_PROVIDER_SITE_OTHER): Payer: Self-pay

## 2016-12-10 ENCOUNTER — Ambulatory Visit (INDEPENDENT_AMBULATORY_CARE_PROVIDER_SITE_OTHER): Payer: Medicare Other | Admitting: Physical Medicine and Rehabilitation

## 2016-12-10 VITALS — BP 142/90 | HR 70

## 2016-12-10 DIAGNOSIS — M961 Postlaminectomy syndrome, not elsewhere classified: Secondary | ICD-10-CM | POA: Diagnosis not present

## 2016-12-10 DIAGNOSIS — M47816 Spondylosis without myelopathy or radiculopathy, lumbar region: Secondary | ICD-10-CM | POA: Diagnosis not present

## 2016-12-10 DIAGNOSIS — G8929 Other chronic pain: Secondary | ICD-10-CM | POA: Diagnosis not present

## 2016-12-10 DIAGNOSIS — M545 Low back pain, unspecified: Secondary | ICD-10-CM

## 2016-12-10 DIAGNOSIS — M25551 Pain in right hip: Secondary | ICD-10-CM | POA: Diagnosis not present

## 2016-12-10 NOTE — Progress Notes (Signed)
Randall [name] - 71 y.o. male MRN [id number]  Date of birth: 1946/04/16  Office Visit Note: Visit Date: 12/10/2016 PCP: [name], MD Referred by: Hulan Fess, MD  Subjective: Chief Complaint  Patient presents with  . Lower Back - Pain   HPI: Randall [name] is a 71 year old gentleman who is known quite well. He's had prior lumbar surgery by Dr. Lorin Mercy. He has gone on to sort of have chronic low back pain more than any referral pattern or radicular complaints. We have completed facet joint blocks with good relief and then completed radiofrequency ablation with some relief but not as much as we hoped. He did not respond to a Gray rami communicate cancer block looking at discogenic pain. He has been maintained with tramadol fairly successfully with some anti-inflammatory off and on. He has had physical therapy and remains active. He comes in today with continued low back pain that's pretty much standard in the middle back still hurts in quite a bit but is calmed down since the prior time we saw him. He did have an epidural injection in there at some point that did seem to help. He reports today that the pain is different is worse on the right side is radiating into the posterior right hip he is getting some groin pain. He says walking is getting a little more difficult as is going from sit to stand. He also has trouble leaning over to get to issue. Interestingly his wife just underwent total hip replacement by Dr. Ninfa Linden is doing okay with that. He has not had any complaints of groin pain prior to this has had no prior imaging of the pelvis or x-rays that I'm aware of. 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 He is not using any radicular complaints or numbness tingling or paresthesias.    Still having lower back pain. The pain is a little different- on right side, radiates down posterior right hip. Walking is  getting a little more difficult. Sometimes has right groin pain. Review of Systems  Constitutional: Negative for chills, fever, malaise/fatigue and weight loss.  HENT: Negative for hearing loss and sinus pain.   Eyes: Negative for blurred vision, double vision and photophobia.  Respiratory: Negative for cough and shortness of breath.   Cardiovascular: Negative for chest pain, palpitations and leg swelling.  Gastrointestinal: Negative for abdominal pain, nausea and vomiting.  Genitourinary: Negative for flank pain.  Musculoskeletal: Positive for back pain and joint pain. Negative for myalgias.  Skin: Negative for itching and rash.  Neurological: Negative for tremors, focal weakness and weakness.  Endo/Heme/Allergies: Negative.   Psychiatric/Behavioral: Negative for depression.  All other systems reviewed and are negative.  Otherwise per HPI.  Assessment & Plan: Visit Diagnoses:  1. Pain in right hip   2. Chronic bilateral low back pain without sciatica   3. Spondylosis without myelopathy or radiculopathy, lumbar region   4. Post laminectomy syndrome     Plan: Findings:  Chronic long-term history of low back pain status post lumbar discectomy and radiofrequency ablation of the facet joints with some relief of his back pain but not as much as he hoped. Continues to have this axial midline back pain worse with prolonged standing and prolonged sitting. Seems to be discogenic in nature. Now has new onset right hip pain 1 pain. X-rays do show some mild to moderate arthritis of any fractures etc.  No specific trauma. We did complete hip anesthetic arthrogram today and he did get relief of some of the symptoms during the anesthetic phase. He had more range of motion he had less pain in the groin still had some posterior buttock pain but his back pain was still present. He is going to see how this does over the next 2 weeks and let us know how he is doing. He'll continue his tramadol. We did discuss  briefly spinal cord stimulator trial which I do think he is approaching a point from his low back pain standpoint were that's something he should consider I did give him some information on this. I spent more than 25 minutes speaking face-to-face with the patient with 50% of the time in counseling.    Meds & Orders: No orders of the defined types were placed in this encounter.   Orders Placed This Encounter  Procedures  . Large Joint Injection/Arthrocentesis  . XR HIP UNILAT W OR W/O PELVIS 1V RIGHT  . XR C-ARM NO REPORT    Follow-up: No Follow-up on file.   Procedures: Intra-articular hip injection with fluoroscopic guidance Date/Time: 12/10/2016 10:19 AM Performed by: Magnus Sinning Authorized by: Magnus Sinning   Consent Given by:  Patient Site marked: the procedure site was marked   Timeout: prior to procedure the correct patient, procedure, and site was verified   Indications:  Pain and diagnostic evaluation Location:  Hip Site:  R hip joint Prep: patient was prepped and draped in usual sterile fashion   Needle Size:  22 G Needle Length:  3.5 inches Approach:  Anterior Ultrasound Guidance: No   Fluoroscopic Guidance: Yes   Arthrogram: No   Medications:  3 mL bupivacaine 0.5 %; 80 mg triamcinolone acetonide 40 MG/ML Aspiration Attempted: Yes   Patient tolerance:  Patient tolerated the procedure well with no immediate complications  There was excellent flow of contrast producing a partial arthrogram of the hip. The patient did have relief of there symptoms during the anesthetic phase of the injection.     No notes on file   Clinical History: No specialty comments available.  He reports that he has quit smoking. He has a 40.00 pack-year smoking history. He has never used smokeless tobacco. No results for input(s): HGBA1C, LABURIC in the last 8760 hours.  Objective:  VS:  HT:    WT:   BMI:     BP:(!) 142/90  HR:70bpm  TEMP: ( )  RESP:  Physical Exam    Constitutional: He is oriented to person, place, and time. He appears well-developed and well-nourished. No distress.  HENT:  Head: Normocephalic and atraumatic.  Eyes: Conjunctivae are normal. Pupils are equal, round, and reactive to light.  Neck: Normal range of motion. Neck supple.  Cardiovascular: Regular rhythm and intact distal pulses.   Pulmonary/Chest: Effort normal. No respiratory distress.  Musculoskeletal:  Patient stands with a forward flexed spine. He does have difficulty rising from a seated position. He has pain with hip rotation somewhat more external and internal. No pain over the greater trochanter she has good distal strength.  Neurological: He is alert and oriented to person, place, and time.  Skin: Skin is warm and dry. No rash noted. No erythema.  Psychiatric: He has a normal mood and affect.  Nursing note and vitals reviewed.   Ortho Exam Imaging: No results found.  Past Medical/Family/Surgical/Social History: Medications & Allergies reviewed per EMR Patient Active Problem List   Diagnosis Date Noted  . HNP (  herniated nucleus pulposus), lumbar 05/15/2014  . S/P cervical spinal fusion 12/19/2011    Class: Diagnosis of  . 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 Kidney stones    1 removed by litho. 1 by Cysto  . RLS (restless legs syndrome)    Family History  Problem Relation Age of Onset  . Anesthesia problems Neg Hx    Past Surgical History:  Procedure Laterality Date  . Cervial  May 2012   4-5, 6-7  . LUMBAR LAMINECTOMY N/A 05/15/2014   Procedure: MICRODISCECTOMY LUMBAR LAMINECTOMY;  Surgeon: Marybelle Killings, MD;  Location: [facility];  Service: Orthopedics;  Laterality: N/A;  L3-4, L4-5 Decompression, Left L3-4, L4-5 Microdiscectomy  . POSTERIOR CERVICAL FUSION/FORAMINOTOMY  12/19/2011   Procedure: POSTERIOR CERVICAL FUSION/FORAMINOTOMY LEVEL 2;  Surgeon: Marybelle Killings, MD;   Location: [facility];  Service: Orthopedics;  Laterality: N/A;  Posterior Cervical Fusion, C4-5, C6-7 Wiring, Vitoss, Iliac aspirate  . TONSILLECTOMY     Social History   Occupational History  . Not on file.   Social History Main Topics  . Smoking status: Former Smoker    Packs/day: 1.00    Years: 40.00  . Smokeless tobacco: Never Used     Comment: Encouraged to remain smoke free.  . Alcohol use Yes     Comment: occ  . Drug use: No  . Sexual activity: Not on file

## 2016-12-12 MED ORDER — BUPIVACAINE HCL 0.5 % IJ SOLN
3.0000 mL | INTRAMUSCULAR | Status: AC | PRN
  Administered 2016-12-10: 3 mL via INTRA_ARTICULAR

## 2016-12-12 MED ORDER — TRIAMCINOLONE ACETONIDE 40 MG/ML IJ SUSP
80.0000 mg | INTRAMUSCULAR | Status: AC | PRN
  Administered 2016-12-10: 80 mg via INTRA_ARTICULAR

## 2016-12-23 ENCOUNTER — Ambulatory Visit (INDEPENDENT_AMBULATORY_CARE_PROVIDER_SITE_OTHER): Payer: Medicare Other | Admitting: Physical Medicine and Rehabilitation

## 2016-12-23 ENCOUNTER — Encounter (INDEPENDENT_AMBULATORY_CARE_PROVIDER_SITE_OTHER): Payer: Self-pay | Admitting: Physical Medicine and Rehabilitation

## 2016-12-23 VITALS — BP 134/79 | HR 67

## 2016-12-23 DIAGNOSIS — M961 Postlaminectomy syndrome, not elsewhere classified: Secondary | ICD-10-CM

## 2016-12-23 DIAGNOSIS — G8929 Other chronic pain: Secondary | ICD-10-CM

## 2016-12-23 DIAGNOSIS — M545 Low back pain, unspecified: Secondary | ICD-10-CM

## 2016-12-23 DIAGNOSIS — M25551 Pain in right hip: Secondary | ICD-10-CM | POA: Diagnosis not present

## 2016-12-23 DIAGNOSIS — M47816 Spondylosis without myelopathy or radiculopathy, lumbar region: Secondary | ICD-10-CM

## 2016-12-23 MED ORDER — TRAMADOL HCL 50 MG PO TABS: 100.0000 mg | ORAL_TABLET | Freq: Two times a day (BID) | ORAL | 1 refills | Status: DC | PRN

## 2016-12-23 NOTE — Progress Notes (Signed)
Randall [name] - 71 y.o. male MRN [id number]  Date of birth: [birth year]  Office Visit Note: Visit Date: 12/23/2016 PCP: [name], MD Referred by: Hulan Fess, MD  Subjective: Chief Complaint  Patient presents with  . Right Hip - Pain   HPI: [name] is a 71 year old quite well who was last seen here on February 14 and we completed diagnostic hip arthrogram. He had relief during the anesthetic phase and he had relief for about 6 or 7 days which was quite helpful. He says it helped walking and help sleeping and even help the pain on the right lower back. He has been having for quite some time is been very frustrating. He states that the pain did return and is really back to where it was at this point. He does not report any locking or clicking. He does not report any focal weakness or paresthesias down the legs. He still has his axial back pain in the right upper buttock to low back. He does report some groin pain at end ranges. He is also asking for refill of tramadol which has been chronic for him and he has not abused this and we have checked the [place] controlled substance database. His pain is quite limited. His wife is present today with him his had a recent right total hip replacement with Dr. Ninfa Linden. She's been doing fairly well with that does continue to have some heel pain down the leg and into the calf.    Right hip injection 2/14. Says it helped more than any other injection he has had, but relief only lasted about 6 or 7 days. Helped with walking, sleeping, pain on right side. Also requesting refill of Tramadol. Review of Systems  Constitutional: Negative for chills, fever, malaise/fatigue and weight loss.  HENT: Negative for hearing loss and sinus pain.   Eyes: Negative for blurred vision, double vision and photophobia.  Respiratory: Negative for cough and shortness of breath.   Cardiovascular: Negative for chest pain, palpitations and leg swelling.    Gastrointestinal: Negative for abdominal pain, nausea and vomiting.  Genitourinary: Negative for flank pain.  Musculoskeletal: Positive for back pain and joint pain. Negative for myalgias.  Skin: Negative for itching and rash.  Neurological: Negative for tremors, focal weakness and weakness.  Endo/Heme/Allergies: Negative.   Psychiatric/Behavioral: Negative for depression.  All other systems reviewed and are negative.  Otherwise per HPI.  Assessment & Plan: Visit Diagnoses:  1. Pain in right hip   2. Chronic right-sided low back pain without sciatica   3. Spondylosis without myelopathy or radiculopathy, lumbar region   4. Post laminectomy syndrome     Plan: Findings:  Chronic low back pain mostly right-sided low back and hip pain with a history of microdiscectomy and decompression by Dr. Lorin Mercy. She's had intermittent relief with injection. He's had some relief with radiofrequency ablation of the facet joint but not as much as we had hoped. He has had some relief with epidural at times. The last time we saw him his symptoms had changed her declare themselves to be consistent with a hip pattern with groin pain and thigh pain. Diagnostic arthrogram. Quite a bit relief and he was actually very pleased for 6 or 7 days with the results that he got. Unfortunately this was not long-lived. I did talk to Dr. Wyline Copas of the orthopedic surgeons in the office and he suggested hip MRI on the right to look for labral degenerative changes. X-ray of the hip  which was done the last time I saw him showed some medial joint line narrowing but otherwise good spacing. We are going to order an MRI of the right hip and will see him back after that. I'll also refill his tramadol which does seem to help him functionally and he does fairly well with that.    Meds & Orders:  Meds ordered this encounter  Medications  . DISCONTD: traMADol (ULTRAM) 50 MG tablet    Sig: Take 2 tablets (100 mg total) by mouth 2 (two) times  daily as needed for moderate pain.    Dispense:  60 tablet    Refill:  1  . traMADol (ULTRAM) 50 MG tablet    Sig: Take 2 tablets (100 mg total) by mouth 2 (two) times daily as needed for moderate pain.    Dispense:  60 tablet    Refill:  1    Orders Placed This Encounter  Procedures  . MR HIP RIGHT W WO CONTRAST    Follow-up: Return for MRI review after completion.   Procedures: No procedures performed  No notes on file   Clinical History: No specialty comments available.  He reports that he has quit smoking. He has a 40.00 pack-year smoking history. He has never used smokeless tobacco. No results for input(s): HGBA1C, LABURIC in the last 8760 hours.  Objective:  VS:  HT:    WT:   BMI:     BP:134/79  HR:67bpm  TEMP: ( )  RESP:  Physical Exam  Constitutional: He is oriented to person, place, and time. He appears well-developed and well-nourished. No distress.  HENT:  Head: Normocephalic and atraumatic.  Eyes: Conjunctivae are normal. Pupils are equal, round, and reactive to light.  Neck: Normal range of motion. Neck supple.  Cardiovascular: Regular rhythm and intact distal pulses.   Pulmonary/Chest: Effort normal. No respiratory distress.  Musculoskeletal:  Patient is somewhat slow to rise from a seated position. Ambulation with a forward flexed spine and does have pain with extension. He does have pain at end ranges of internal rotation and external rotation of the right hip. These are all somewhat concordant however. He has no strength loss in the distal lower extremities.   Neurological: He is alert and oriented to person, place, and time. He exhibits abnormal muscle tone. Coordination normal.  Skin: Skin is warm and dry. No rash noted. No erythema.  Psychiatric: He has a normal mood and affect.  Nursing note and vitals reviewed.   Ortho Exam Imaging: No results found.  Past Medical/Family/Surgical/Social History: Medications & Allergies reviewed per EMR Patient  Active Problem List   Diagnosis Date Noted  . HNP (herniated nucleus pulposus), lumbar 05/15/2014  . S/P cervical spinal fusion 12/19/2011    Class: Diagnosis of  . 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 Kidney stones    1 removed by litho. 1 by Cysto  . RLS (restless legs syndrome)    Family History  Problem Relation Age of Onset  . Anesthesia problems Neg Hx    Past Surgical History:  Procedure Laterality Date  . Cervial  May 2012   4-5, 6-7  . LUMBAR LAMINECTOMY N/A 05/15/2014   Procedure: MICRODISCECTOMY LUMBAR LAMINECTOMY;  Surgeon: Marybelle Killings, MD;  Location: [facility];  Service: Orthopedics;  Laterality: N/A;  L3-4, L4-5 Decompression, Left L3-4, L4-5 Microdiscectomy  . POSTERIOR CERVICAL FUSION/FORAMINOTOMY  12/19/2011   Procedure: POSTERIOR  CERVICAL FUSION/FORAMINOTOMY LEVEL 2;  Surgeon: Marybelle Killings, MD;  Location: [facility];  Service: Orthopedics;  Laterality: N/A;  Posterior Cervical Fusion, C4-5, C6-7 Wiring, Vitoss, Iliac aspirate  . TONSILLECTOMY     Social History   Occupational History  . Not on file.   Social History Main Topics  . Smoking status: Former Smoker    Packs/day: 1.00    Years: 40.00  . Smokeless tobacco: Never Used     Comment: Encouraged to remain smoke free.  . Alcohol use Yes     Comment: occ  . Drug use: No  . Sexual activity: Not on file

## 2016-12-24 ENCOUNTER — Encounter (INDEPENDENT_AMBULATORY_CARE_PROVIDER_SITE_OTHER): Payer: Self-pay | Admitting: Physical Medicine and Rehabilitation

## 2017-01-05 ENCOUNTER — Inpatient Hospital Stay: Admission: RE | Admit: 2017-01-05 | Payer: Medicare Other | Source: Ambulatory Visit

## 2017-01-07 ENCOUNTER — Ambulatory Visit (INDEPENDENT_AMBULATORY_CARE_PROVIDER_SITE_OTHER): Payer: Medicare Other | Admitting: Physical Medicine and Rehabilitation

## 2017-01-13 ENCOUNTER — Telehealth (INDEPENDENT_AMBULATORY_CARE_PROVIDER_SITE_OTHER): Payer: Self-pay | Admitting: Physical Medicine and Rehabilitation

## 2017-01-13 ENCOUNTER — Ambulatory Visit
Admission: RE | Admit: 2017-01-13 | Discharge: 2017-01-13 | Disposition: A | Discharge status: Home / Self Care | Payer: Medicare Other | Source: Ambulatory Visit | Attending: Physical Medicine and Rehabilitation | Admitting: Physical Medicine and Rehabilitation

## 2017-01-13 DIAGNOSIS — M25551 Pain in right hip: Secondary | ICD-10-CM | POA: Diagnosis not present

## 2017-01-13 MED ORDER — GADOBENATE DIMEGLUMINE 529 MG/ML IV SOLN
17.0000 mL | Freq: Once | INTRAVENOUS | Status: AC | PRN
  Administered 2017-01-13: 17 mL via INTRAVENOUS

## 2017-01-13 NOTE — Telephone Encounter (Signed)
Dr. Ernestina Patches says no to arthrogram. I called [facility] and advised.

## 2017-01-13 NOTE — Telephone Encounter (Signed)
Jone called from [facility] imagining wondering if an arthrogram was needed for the patient? CB # [phone]

## 2017-01-15 ENCOUNTER — Ambulatory Visit (INDEPENDENT_AMBULATORY_CARE_PROVIDER_SITE_OTHER): Payer: Medicare Other

## 2017-01-15 ENCOUNTER — Encounter (INDEPENDENT_AMBULATORY_CARE_PROVIDER_SITE_OTHER): Payer: Self-pay | Admitting: Physical Medicine and Rehabilitation

## 2017-01-15 ENCOUNTER — Ambulatory Visit (INDEPENDENT_AMBULATORY_CARE_PROVIDER_SITE_OTHER): Payer: Medicare Other | Admitting: Physical Medicine and Rehabilitation

## 2017-01-15 VITALS — BP 152/90 | HR 77

## 2017-01-15 DIAGNOSIS — G8929 Other chronic pain: Secondary | ICD-10-CM | POA: Insufficient documentation

## 2017-01-15 DIAGNOSIS — M545 Low back pain, unspecified: Secondary | ICD-10-CM | POA: Insufficient documentation

## 2017-01-15 DIAGNOSIS — M961 Postlaminectomy syndrome, not elsewhere classified: Secondary | ICD-10-CM

## 2017-01-15 DIAGNOSIS — M25551 Pain in right hip: Secondary | ICD-10-CM | POA: Diagnosis not present

## 2017-01-15 DIAGNOSIS — M7071 Other bursitis of hip, right hip: Secondary | ICD-10-CM | POA: Diagnosis not present

## 2017-01-15 NOTE — Progress Notes (Signed)
Randall [name] - 71 y.o. male MRN [id number]  Date of birth: 1945-11-08  Office Visit Note: Visit Date: 01/15/2017 PCP: [name], MD Referred by: Hulan Fess, MD  Subjective: Chief Complaint  Patient presents with  . Right Hip - Pain   HPI: Randall [name] is a 71 year old gentleman that we know quite well after see him over the last few years. He is a patient of Dr. Lorin Mercy who had prior lumbar laminectomy discectomy with continued periods of low back and right hip pain. His biggest problem as of the last several months has been this hip pain that is posterior located about the belt line but he can refer laterally and posteriorly he is even had some groin pain at times with movement. The patient has done well in the past with radiofrequency ablation for his back pain but this is been somewhat lower. He didn't respond very well to epidural injection but actually had really good relief with a hip intra-articular injection diagnostically. The anesthetic phase during [name] and even for a while after gave him a lot of movement in his back and hip area and he was pretty excited about that but it didn't last very long. We ended up getting a MRI of his hip that was suggested by Dr. Erlinda Hong who I reviewed the case with. This MRI was completed and is reviewed below. Interestingly there were no labral type problems and there was some degenerative changes with the hips but the biggest finding was a right-sided ischial bursitis with edema present in the hamstring tendon insertion and the issue. I showed the patient his images today. He does have a lot of pain with sitting on the commode and a lot of pain with movement of the limb. I did tell him I could not explain why he felt better with hip injection as that would not relate to the initial bursitis as these could still have issue with the hip even though MRI is fairly normal for the hip other than some degenerative change. The patient is not having any  radicular symptoms in terms of paresthesias. He continues to take tramadol take 2 tramadol in the morning and do quite well most days. He has not had any focal weakness or bowel or bladder difficulty.    Here for MRI review. No changes in right hip or lower back pain. Review of Systems  Constitutional: Negative for chills, fever, malaise/fatigue and weight loss.  HENT: Negative for hearing loss and sinus pain.   Eyes: Negative for blurred vision, double vision and photophobia.  Respiratory: Negative for cough and shortness of breath.   Cardiovascular: Negative for chest pain, palpitations and leg swelling.  Gastrointestinal: Negative for abdominal pain, nausea and vomiting.  Genitourinary: Negative for flank pain.  Musculoskeletal: Positive for back pain. Negative for myalgias.  Skin: Negative for itching and rash.  Neurological: Negative for tremors, focal weakness and weakness.  Endo/Heme/Allergies: Negative.   Psychiatric/Behavioral: Negative for depression.  All other systems reviewed and are negative.  Otherwise per HPI.  Assessment & Plan: Visit Diagnoses:  1. Ischial bursitis of right side   2. Pain in right hip   3. Chronic bilateral low back pain without sciatica   4. Post laminectomy syndrome     Plan: Findings:  Chronic worsening severe at times right low back belt line and buttock and hip pain on the right. Complicated diagnostically with good relief temporarily with intra-articular hip injection but no long-lasting benefit. Now MRI showing some degenerative  hip changes but nothing specific but there is this pretty significant finding of issue him edema along with hamstring tendon edema on the right side. We did complete an issue a bursa-type injection today with fluoroscopic guidance and with a monitor how he does. Clearly I think this may still coming from his back to a degree has had prior surgery does have facet arthropathy. I still maintain it at some point he may be a  good candidate for spinal cord stimulator trial. We will maintain him with the tramadol at this point as he does seemingly well with that. He is given a call us back with the results of the issue of injection. I spent more than 25 minutes speaking face-to-face with the patient with 50% of the time in counseling.    Meds & Orders: No orders of the defined types were placed in this encounter.   Orders Placed This Encounter  Procedures  . Large Joint Injection/Arthrocentesis  . XR C-ARM NO REPORT    Follow-up: Return if symptoms worsen or fail to improve, 2 weeks.   Procedures: She'll bursa injection with fluoroscopic guidance Date/Time: 01/15/2017 10:58 AM Performed by: Magnus Sinning Authorized by: Magnus Sinning   Consent Given by:  Parent Site marked: the procedure site was marked   Timeout: prior to procedure the correct patient, procedure, and site was verified   Indications:  Pain and diagnostic evaluation Location:  Hip Hip joint: R. Ischial Bursa. Prep: patient was prepped and draped in usual sterile fashion   Needle Size:  22 G Needle Length:  3.5 inches Approach:  Posterior Ultrasound Guidance: No   Fluoroscopic Guidance: Yes   Arthrogram: No   Medications:  9 mL bupivacaine 0.5 %; 60 mg triamcinolone acetonide 40 MG/ML Aspiration Attempted: No   Patient tolerance:  Patient tolerated the procedure well with no immediate complications  There was excellent flow of contrast along the ischium without vascular uptake.     No notes on file   Clinical History: No specialty comments available.  He reports that he has quit smoking. He has a 40.00 pack-year smoking history. He has never used smokeless tobacco. No results for input(s): HGBA1C, LABURIC in the last 8760 hours.  Objective:  VS:  HT:    WT:   BMI:     BP:(!) 152/90  HR:77bpm  TEMP: ( )  RESP:  Physical Exam  Constitutional: He is oriented to person, place, and time. He appears well-developed and  well-nourished. No distress.  HENT:  Head: Normocephalic and atraumatic.  Eyes: Conjunctivae are normal. Pupils are equal, round, and reactive to light.  Neck: Normal range of motion. Neck supple.  Cardiovascular: Regular rhythm and intact distal pulses.   Pulmonary/Chest: Effort normal. No respiratory distress.  Musculoskeletal:  Patient is very slow to rise from a seated position. He stands with a forward flexed spine. He is very stiff in the lumbar spine but does have pain with extension. He doesn't have any active trigger points but very tight in the quadratus lumborum. He has no pain over the greater trochanters. He does have pain over the right issue. He has good distal strength.  Neurological: He is alert and oriented to person, place, and time. He exhibits normal muscle tone. Coordination normal.  Skin: Skin is warm and dry. No rash noted. No erythema.  Psychiatric: He has a normal mood and affect.  Nursing note and vitals reviewed.   Ortho Exam Imaging: No results found.  Past Medical/Family/Surgical/Social History: Medications &  Allergies reviewed per EMR Patient Active Problem List   Diagnosis Date Noted  . Post laminectomy syndrome 01/17/2017  . Ischial bursitis of right side 01/15/2017  . Pain in right hip 01/15/2017  . Chronic bilateral low back pain without sciatica 01/15/2017  . HNP (herniated nucleus pulposus), lumbar 05/15/2014  . S/P cervical spinal fusion 12/19/2011    Class: Diagnosis of  . 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 Kidney stones    1 removed by litho. 1 by Cysto  . RLS (restless legs syndrome)    Family History  Problem Relation Age of Onset  . Anesthesia problems Neg Hx    Past Surgical History:  Procedure Laterality Date  . Cervial  May 2012   4-5, 6-7  . LUMBAR LAMINECTOMY N/A 05/15/2014   Procedure: MICRODISCECTOMY LUMBAR LAMINECTOMY;  Surgeon: Marybelle Killings, MD;  Location: [facility];  Service: Orthopedics;  Laterality: N/A;  L3-4, L4-5 Decompression, Left L3-4, L4-5 Microdiscectomy  . POSTERIOR CERVICAL FUSION/FORAMINOTOMY  12/19/2011   Procedure: POSTERIOR CERVICAL FUSION/FORAMINOTOMY LEVEL 2;  Surgeon: Marybelle Killings, MD;  Location: [facility];  Service: Orthopedics;  Laterality: N/A;  Posterior Cervical Fusion, C4-5, C6-7 Wiring, Vitoss, Iliac aspirate  . TONSILLECTOMY     Social History   Occupational History  . Not on file.   Social History Main Topics  . Smoking status: Former Smoker    Packs/day: 1.00    Years: 40.00  . Smokeless tobacco: Never Used     Comment: Encouraged to remain smoke free.  . Alcohol use Yes     Comment: occ  . Drug use: No  . Sexual activity: Not on file

## 2017-01-17 DIAGNOSIS — M961 Postlaminectomy syndrome, not elsewhere classified: Secondary | ICD-10-CM | POA: Insufficient documentation

## 2017-01-17 MED ORDER — BUPIVACAINE HCL 0.5 % IJ SOLN
9.0000 mL | INTRAMUSCULAR | Status: AC | PRN
  Administered 2017-01-15: 9 mL via INTRA_ARTICULAR

## 2017-01-17 MED ORDER — TRIAMCINOLONE ACETONIDE 40 MG/ML IJ SUSP
60.0000 mg | INTRAMUSCULAR | Status: AC | PRN
  Administered 2017-01-15: 60 mg via INTRA_ARTICULAR

## 2017-01-29 ENCOUNTER — Telehealth (INDEPENDENT_AMBULATORY_CARE_PROVIDER_SITE_OTHER): Payer: Self-pay | Admitting: Physical Medicine and Rehabilitation

## 2017-01-30 NOTE — Telephone Encounter (Signed)
great

## 2017-02-16 ENCOUNTER — Other Ambulatory Visit (INDEPENDENT_AMBULATORY_CARE_PROVIDER_SITE_OTHER): Payer: Self-pay | Admitting: Physical Medicine and Rehabilitation

## 2017-02-16 DIAGNOSIS — M25551 Pain in right hip: Secondary | ICD-10-CM

## 2017-02-16 NOTE — Telephone Encounter (Signed)
Please advise 

## 2017-02-17 NOTE — Telephone Encounter (Signed)
Prescription faxed and patient notified.

## 2017-02-19 ENCOUNTER — Other Ambulatory Visit (INDEPENDENT_AMBULATORY_CARE_PROVIDER_SITE_OTHER): Payer: Self-pay | Admitting: Physical Medicine and Rehabilitation

## 2017-02-19 DIAGNOSIS — M25551 Pain in right hip: Secondary | ICD-10-CM

## 2017-02-20 NOTE — Telephone Encounter (Signed)
This request was approved by Dr. Ernestina Patches and faxed to pharmacy. Pharmacy states they did not receive, and rx was called to patient's pharmacy on 02/19/17. Duplicate request.

## 2017-04-08 ENCOUNTER — Telehealth (INDEPENDENT_AMBULATORY_CARE_PROVIDER_SITE_OTHER): Payer: Self-pay

## 2017-04-08 NOTE — Telephone Encounter (Signed)
Pt scheduled for 04/15/17 @ 3:45

## 2017-04-08 NOTE — Telephone Encounter (Signed)
Yes ok 

## 2017-04-08 NOTE — Telephone Encounter (Signed)
Patient left vm requesting another right hip injection and ov to discuss spinal cord stimulator. Ok to schedule at same time?

## 2017-04-15 ENCOUNTER — Ambulatory Visit (INDEPENDENT_AMBULATORY_CARE_PROVIDER_SITE_OTHER): Payer: Self-pay

## 2017-04-15 ENCOUNTER — Ambulatory Visit (INDEPENDENT_AMBULATORY_CARE_PROVIDER_SITE_OTHER): Payer: Medicare Other | Admitting: Physical Medicine and Rehabilitation

## 2017-04-15 DIAGNOSIS — M47816 Spondylosis without myelopathy or radiculopathy, lumbar region: Secondary | ICD-10-CM | POA: Diagnosis not present

## 2017-04-15 DIAGNOSIS — M25571 Pain in right ankle and joints of right foot: Secondary | ICD-10-CM

## 2017-04-15 DIAGNOSIS — M7071 Other bursitis of hip, right hip: Secondary | ICD-10-CM

## 2017-04-15 DIAGNOSIS — M961 Postlaminectomy syndrome, not elsewhere classified: Secondary | ICD-10-CM | POA: Diagnosis not present

## 2017-04-15 DIAGNOSIS — M25551 Pain in right hip: Secondary | ICD-10-CM

## 2017-04-15 DIAGNOSIS — G8929 Other chronic pain: Secondary | ICD-10-CM

## 2017-04-15 DIAGNOSIS — M545 Low back pain, unspecified: Secondary | ICD-10-CM

## 2017-04-15 DIAGNOSIS — M5416 Radiculopathy, lumbar region: Secondary | ICD-10-CM | POA: Diagnosis not present

## 2017-04-15 MED ORDER — TRAMADOL HCL 50 MG PO TABS: ORAL_TABLET | ORAL | 1 refills | Status: DC

## 2017-04-15 NOTE — Patient Instructions (Signed)

## 2017-04-15 NOTE — Progress Notes (Deleted)
Right side low back pain into hip. Pain oustide of right ankle at times for a couple of weeks.Hervey Ard pains. States when it first experienced it it lasted for awhile and now it comes and goes with bearing weight. Taking Tramadol. Needs refill.

## 2017-04-15 NOTE — Progress Notes (Signed)
Randall [name] - 71 y.o. male MRN [id number]  Date of birth: 1945-11-07  Office Visit Note: Visit Date: 04/15/2017 PCP: Hulan Fess, MD Referred by: Hulan Fess, MD  Subjective: Chief Complaint  Patient presents with  . Lower Back - Pain   HPI: Mr. Berthelot is a pleasant 71 year old gentleman that I have not seen since March of this year but I have seen on numerous occasion for his back and hip pain. By way of brief review he has had discectomy laminectomy performed twice by Dr. Lorin Mercy for recurrent disc herniation. He got some relief of the more radicular component but he still has a lot of pain in the lower back and stiffness. He has symptoms and almost seem consistent with neurogenic claudication but he has not really had stenosis on MRI. He does have degenerative changes and facet joint arthritis. He didn't do as well as we hope with facet joint denervation. He says he does fairly well taking 2 tramadol and Tylenol he does that typically once a day Helen bad day might do it twice. He does need a refill of his tramadol today. We did discuss with him briefly about spinal cord stimulators and how to do the trial. He doesn't feel he is really ready for that quite yet. We did discuss that briefly talk about that more in the findings and plan. He continues to have this right hip pain that is somewhat referral to the groin. We completed a intra-articular hip injection which gave him quite a bit her relief during the diagnostic phase. Even Robaxin said during the anesthetic phase it was probably the best he's felt a long time. Unfortunately cortisone or steroid part is never seemed to help him that much. He went on to have an MRI of the pelvis and hip and this showed more of an initial bursitis but not much in the way of great degenerative changes of the hip. We did not do an MRI arthrogram. Interestingly his wife has had a total hip replacement by Dr. Ninfa Linden. Patient did state that the initial bursa  injection did help the posterior pain of where he was sitting and having a problem sitting. He has no radicular complaints down the legs no paresthesias no numbness or tingling. No focal weakness. He's had no new trauma.  Lastly he's been having 2 weeks of right ankle pain. This is lateral ankle and below the malleolus. He's had no swelling or trauma. He states the pain can be excruciating at times. At other times is not as bad. He's had no redness or other discoloration.    Review of Systems  Constitutional: Negative for chills, fever, malaise/fatigue and weight loss.  HENT: Negative for hearing loss and sinus pain.   Eyes: Negative for blurred vision, double vision and photophobia.  Respiratory: Negative for cough and shortness of breath.   Cardiovascular: Negative for chest pain, palpitations and leg swelling.  Gastrointestinal: Negative for abdominal pain, nausea and vomiting.  Genitourinary: Negative for flank pain.  Musculoskeletal: Positive for back pain and joint pain. Negative for myalgias.  Skin: Negative for itching and rash.  Neurological: Negative for tremors, focal weakness and weakness.  Endo/Heme/Allergies: Negative.   Psychiatric/Behavioral: Negative for depression.  All other systems reviewed and are negative.  Otherwise per HPI.  Assessment & Plan: Visit Diagnoses:  1. Pain in right hip   2. Ischial bursitis of right side   3. Lumbar radiculopathy   4. Chronic bilateral low back pain without sciatica  5. Pain in right ankle and joints of right foot   6. Post laminectomy syndrome   7. Spondylosis without myelopathy or radiculopathy, lumbar region     Plan: Findings:  1. Chronic recalcitrant mostly axial low back pain and some symptoms consistent with neurogenic claudication into the buttock region. He has gotten some relief with epidural injection as well as facet joint blocks but not as much but denervation. He continues to use tramadol any stress to stay active.  He's had physical therapy. He's had muscle relaxers. I do think is a good candidate for spinal cord stimulator trial. He had some questions for me today and it does sound like he reviewed some of the literature that we gave him it just wasn't something that sounded like it was beneficial. He reported that he did look online and try to read a lot about it. I did tell him that I would not for cement to anything like that. I did give him some newer literature to review we can review that at a different time. As for now we'll refill the tramadol and just keep an eye on him. He has had prior discectomy laminectomy 2. He has an MRI not showing any more stenosis.  2. Right hip pain which is somewhat low back pain and lateral and somewhat groin. No real pain except at end ranges of extreme in ranges. He had initial bursitis on MRI of the hips. Injection of the initial bursa did relief some of his posterior pain. His thinking today is that he really remembers the diagnostic part of the hip injection that we did and felt like that actually relieves a lot of his symptoms including maybe even some of the back pain. He wants to go ahead and try another intra-articular hip injection. I think that's fine it's been a while since we've done that and we may get a different outcome in terms of the cortisone part of it. If he does extremely well again during the diagnostic anesthetic phase but doesn't do as well again during the steroid phase I would probably have him see one of the orthopedic surgeons to evaluate his hip. He may need a MRI arthrogram to look for something like a labral tear. I spent more than 25 minutes speaking face-to-face with the patient with 50% of the time in counseling. I did look up the [place] controlled substance database and is getting no other controlled substances from any other doctors.  3. 2 weeks of right ankle pain without trauma. I think this is more of a peroneal tendinitis. I did give  him samples of [place]aid 2.    Meds & Orders:  Meds ordered this encounter  Medications  . traMADol (ULTRAM) 50 MG tablet    Sig: TAKE 2 TABLETS BY MOUTH TWICE A DAY AS NEEDED FOR PAIN    Dispense:  60 tablet    Refill:  1    Not to exceed 4 additional fills before 06/21/2017    Orders Placed This Encounter  Procedures  . Large Joint Injection/Arthrocentesis  . XR C-ARM NO REPORT    Follow-up: Return if symptoms worsen or fail to improve.   Procedures: Intra-articular hip injection with fluoroscopic guidance. Date/Time: 04/15/2017 4:34 PM Performed by: Magnus Sinning Authorized by: Magnus Sinning   Consent Given by:  Patient Site marked: the procedure site was marked   Timeout: prior to procedure the correct patient, procedure, and site was verified   Indications:  Pain and diagnostic evaluation  Location:  Hip Site:  R hip joint Prep: patient was prepped and draped in usual sterile fashion   Needle Size:  22 G Needle Length:  3.5 inches Approach:  Anterior Ultrasound Guidance: No   Fluoroscopic Guidance: Yes   Arthrogram: No   Medications:  3 mL bupivacaine 0.5 %; 80 mg triamcinolone acetonide 40 MG/ML Aspiration Attempted: Yes   Patient tolerance:  Patient tolerated the procedure well with no immediate complications  There was excellent flow of contrast producing a partial arthrogram of the hip. The patient did have relief of symptoms during the anesthetic phase of the injection.      No notes on file   Clinical History: No specialty comments available.  He reports that he has quit smoking. He has a 40.00 pack-year smoking history. He has never used smokeless tobacco. No results for input(s): HGBA1C, LABURIC in the last 8760 hours.  Objective:  VS:  HT:    WT:   BMI:     BP:   HR: bpm  TEMP: ( )  RESP:  Physical Exam  Constitutional: He is oriented to person, place, and time. He appears well-developed and well-nourished. No distress.  HENT:  Head:  Normocephalic and atraumatic.  Nose: Nose normal.  Mouth/Throat: Oropharynx is clear and moist.  Eyes: Conjunctivae are normal. Pupils are equal, round, and reactive to light.  Neck: Normal range of motion. Neck supple.  Cardiovascular: Regular rhythm and intact distal pulses.   Pulmonary/Chest: Effort normal and breath sounds normal.  Abdominal: Soft. He exhibits no distension.  Musculoskeletal: He exhibits no deformity.  Examination of lumbar spine shows a forward flexed lumbar spine with a lot of stiffness with extension and some pain with extension rotation. He has no real pain over the greater trochanters bilaterally. He has pain on the right hip with extreme internal rotation and external rotation. More lateral pain. He has good distal strength throughout with no clonus. Examination of the right ankle shows good stability without swelling there is some point tenderness. He does have tenderness along the peroneal tendon.  Neurological: He is alert and oriented to person, place, and time.  Skin: Skin is warm. No rash noted.  Psychiatric: He has a normal mood and affect. His behavior is normal.  Nursing note and vitals reviewed.   Ortho Exam Imaging: No results found.  Past Medical/Family/Surgical/Social History: Medications & Allergies reviewed per EMR Patient Active Problem List   Diagnosis Date Noted  . Post laminectomy syndrome 01/17/2017  . Ischial bursitis of right side 01/15/2017  . Pain in right hip 01/15/2017  . Chronic bilateral low back pain without sciatica 01/15/2017  . HNP (herniated nucleus pulposus), lumbar 05/15/2014  . S/P cervical spinal fusion 12/19/2011    Class: Diagnosis of  . 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 Kidney stones    1 removed by litho. 1 by Cysto  . RLS (restless legs syndrome)    Family History  Problem Relation Age of Onset  . Anesthesia problems Neg Hx     Past Surgical History:  Procedure Laterality Date  . Cervial  May 2012   4-5, 6-7  . LUMBAR LAMINECTOMY N/A 05/15/2014   Procedure: MICRODISCECTOMY LUMBAR LAMINECTOMY;  Surgeon: Marybelle Killings, MD;  Location: [place];  Service: Orthopedics;  Laterality: N/A;  L3-4, L4-5 Decompression, Left L3-4, L4-5 Microdiscectomy  . POSTERIOR CERVICAL FUSION/FORAMINOTOMY  12/19/2011   Procedure: POSTERIOR CERVICAL  FUSION/FORAMINOTOMY LEVEL 2;  Surgeon: Marybelle Killings, MD;  Location: [place];  Service: Orthopedics;  Laterality: N/A;  Posterior Cervical Fusion, C4-5, C6-7 Wiring, Vitoss, Iliac aspirate  . TONSILLECTOMY     Social History   Occupational History  . Not on file.   Social History Main Topics  . Smoking status: Former Smoker    Packs/day: 1.00    Years: 40.00  . Smokeless tobacco: Never Used     Comment: Encouraged to remain smoke free.  . Alcohol use Yes     Comment: occ  . Drug use: No  . Sexual activity: Not on file

## 2017-04-18 ENCOUNTER — Encounter (INDEPENDENT_AMBULATORY_CARE_PROVIDER_SITE_OTHER): Payer: Self-pay | Admitting: Physical Medicine and Rehabilitation

## 2017-04-18 MED ORDER — TRIAMCINOLONE ACETONIDE 40 MG/ML IJ SUSP
80.0000 mg | INTRAMUSCULAR | Status: AC | PRN
  Administered 2017-04-15: 80 mg via INTRA_ARTICULAR

## 2017-04-18 MED ORDER — BUPIVACAINE HCL 0.5 % IJ SOLN
3.0000 mL | INTRAMUSCULAR | Status: AC | PRN
  Administered 2017-04-15: 3 mL via INTRA_ARTICULAR

## 2017-04-22 ENCOUNTER — Telehealth (INDEPENDENT_AMBULATORY_CARE_PROVIDER_SITE_OTHER): Payer: Self-pay | Admitting: Physical Medicine and Rehabilitation

## 2017-06-11 ENCOUNTER — Encounter (INDEPENDENT_AMBULATORY_CARE_PROVIDER_SITE_OTHER): Payer: Self-pay | Admitting: Physical Medicine and Rehabilitation

## 2017-06-11 ENCOUNTER — Ambulatory Visit (INDEPENDENT_AMBULATORY_CARE_PROVIDER_SITE_OTHER): Payer: Medicare Other

## 2017-06-11 ENCOUNTER — Ambulatory Visit (INDEPENDENT_AMBULATORY_CARE_PROVIDER_SITE_OTHER): Payer: Medicare Other | Admitting: Physical Medicine and Rehabilitation

## 2017-06-11 VITALS — BP 132/84 | HR 78

## 2017-06-11 DIAGNOSIS — M5442 Lumbago with sciatica, left side: Secondary | ICD-10-CM

## 2017-06-11 DIAGNOSIS — M5441 Lumbago with sciatica, right side: Secondary | ICD-10-CM

## 2017-06-11 DIAGNOSIS — G8929 Other chronic pain: Secondary | ICD-10-CM

## 2017-06-11 DIAGNOSIS — M5416 Radiculopathy, lumbar region: Secondary | ICD-10-CM | POA: Diagnosis not present

## 2017-06-11 DIAGNOSIS — M25551 Pain in right hip: Secondary | ICD-10-CM

## 2017-06-11 DIAGNOSIS — M7071 Other bursitis of hip, right hip: Secondary | ICD-10-CM

## 2017-06-11 DIAGNOSIS — M961 Postlaminectomy syndrome, not elsewhere classified: Secondary | ICD-10-CM

## 2017-06-11 MED ORDER — LIDOCAINE HCL (PF) 1 % IJ SOLN
2.0000 mL | Freq: Once | INTRAMUSCULAR | Status: AC
  Administered 2017-06-11: 2 mL

## 2017-06-11 MED ORDER — METHYLPREDNISOLONE ACETATE 80 MG/ML IJ SUSP
80.0000 mg | Freq: Once | INTRAMUSCULAR | Status: AC
  Administered 2017-06-11: 80 mg

## 2017-06-11 NOTE — Progress Notes (Deleted)
Pain across lower back. Comes and goes. Sharp pains at times with certain movement. Right side is worse. Pain outside of lower legs at times. Denies numbness or tingling.

## 2017-06-11 NOTE — Progress Notes (Unsigned)
Fluoro time: 39 sec Mgy: 30.41

## 2017-06-11 NOTE — Patient Instructions (Signed)

## 2017-06-12 NOTE — Procedures (Signed)
Randall [name] is a 71 year old gentleman with prior lumbar surgery 2 with continued axial low back pain for some referral into the hips right more than left. No real tingling numbness. This been a chronic ongoing recalcitrant problem. He did well with facet joint blocks but not with radiofrequency ablation unfortunately. He maintains pain relief with tramadol daily and some anti-inflammatory. He finds it difficult to do a lot of the things he would like to due to the pain in his back. It's worse with standing and he does get sharp pains with movement. We have discussed spinal cord stimulator trials with him. He has gotten some relief with epidural injection although short lived. We have not completed an epidural injection in quite some time so we are going to try this today to see how he does diagnostically.  Lumbosacral Transforaminal Epidural Steroid Injection - Sub-Pedicular Approach with Fluoroscopic Guidance  Patient: Randall [name]      Date of Birth: 03-Jun-1946 MRN: [id number] PCP: Hulan Fess, MD      Visit Date: 06/11/2017   Universal Protocol:    Date/Time: 06/11/2017  Consent Given By: the patient  Position: PRONE  Additional Comments: Vital signs were monitored before and after the procedure. Patient was prepped and draped in the usual sterile fashion. The correct patient, procedure, and site was verified.   Injection Procedure Details:  Procedure Site One Meds Administered:  Meds ordered this encounter  Medications  . lidocaine (PF) (XYLOCAINE) 1 % injection 2 mL  . methylPREDNISolone acetate (DEPO-MEDROL) injection 80 mg    Laterality: Bilateral  Location/Site:  L4-L5  Needle size: 22 G  Needle type: Spinal  Needle Placement: Transforaminal  Findings:  -Contrast Used: 1 mL iohexol 180 mg iodine/mL   -Comments: Excellent flow of contrast along the nerve and into the epidural space.  Procedure Details: After squaring off the end-plates to get a true AP view,  the C-arm was positioned so that an oblique view of the foramen as noted above was visualized. The target area is just inferior to the "nose of the scotty dog" or sub pedicular. The soft tissues overlying this structure were infiltrated with 2-3 ml. of 1% Lidocaine without Epinephrine.  The spinal needle was inserted toward the target using a "trajectory" view along the fluoroscope beam.  Under AP and lateral visualization, the needle was advanced so it did not puncture dura and was located close the 6 O'Clock position of the pedical in AP tracterory. Biplanar projections were used to confirm position. Aspiration was confirmed to be negative for CSF and/or blood. A 1-2 ml. volume of Isovue-250 was injected and flow of contrast was noted at each level. Radiographs were obtained for documentation purposes.   After attaining the desired flow of contrast documented above, a 0.5 to 1.0 ml test dose of 0.25% Marcaine was injected into each respective transforaminal space.  The patient was observed for 90 seconds post injection.  After no sensory deficits were reported, and normal lower extremity motor function was noted,   the above injectate was administered so that equal amounts of the injectate were placed at each foramen (level) into the transforaminal epidural space.   Additional Comments:  The patient tolerated the procedure well Dressing: Band-Aid    Post-procedure details: Patient was observed during the procedure. Post-procedure instructions were reviewed.  Patient left the clinic in stable condition.

## 2017-06-15 ENCOUNTER — Other Ambulatory Visit (INDEPENDENT_AMBULATORY_CARE_PROVIDER_SITE_OTHER): Payer: Self-pay | Admitting: Physical Medicine and Rehabilitation

## 2017-06-15 DIAGNOSIS — M25551 Pain in right hip: Secondary | ICD-10-CM

## 2017-06-15 NOTE — Telephone Encounter (Signed)
Please advise 

## 2017-06-16 NOTE — Telephone Encounter (Signed)
Called in to patient's pharmacy. 

## 2017-07-10 DIAGNOSIS — Z23 Encounter for immunization: Secondary | ICD-10-CM | POA: Diagnosis not present

## 2017-08-10 ENCOUNTER — Other Ambulatory Visit (INDEPENDENT_AMBULATORY_CARE_PROVIDER_SITE_OTHER): Payer: Self-pay | Admitting: Physical Medicine and Rehabilitation

## 2017-08-10 DIAGNOSIS — M25551 Pain in right hip: Secondary | ICD-10-CM

## 2017-08-10 NOTE — Telephone Encounter (Signed)
Please advise 

## 2017-08-10 NOTE — Telephone Encounter (Signed)
Faxed

## 2017-08-17 DIAGNOSIS — R351 Nocturia: Secondary | ICD-10-CM | POA: Diagnosis not present

## 2017-08-17 DIAGNOSIS — R3915 Urgency of urination: Secondary | ICD-10-CM | POA: Diagnosis not present

## 2017-08-17 DIAGNOSIS — N401 Enlarged prostate with lower urinary tract symptoms: Secondary | ICD-10-CM | POA: Diagnosis not present

## 2017-08-19 NOTE — Telephone Encounter (Signed)
Closing encounter

## 2017-08-26 DIAGNOSIS — E669 Obesity, unspecified: Secondary | ICD-10-CM | POA: Diagnosis not present

## 2017-08-26 DIAGNOSIS — G2581 Restless legs syndrome: Secondary | ICD-10-CM | POA: Diagnosis not present

## 2017-08-26 DIAGNOSIS — Z86010 Personal history of colonic polyps: Secondary | ICD-10-CM | POA: Diagnosis not present

## 2017-08-26 DIAGNOSIS — Z0000 Encounter for general adult medical examination without abnormal findings: Secondary | ICD-10-CM | POA: Diagnosis not present

## 2017-08-26 DIAGNOSIS — N401 Enlarged prostate with lower urinary tract symptoms: Secondary | ICD-10-CM | POA: Diagnosis not present

## 2017-08-26 DIAGNOSIS — Z125 Encounter for screening for malignant neoplasm of prostate: Secondary | ICD-10-CM | POA: Diagnosis not present

## 2017-08-26 DIAGNOSIS — I10 Essential (primary) hypertension: Secondary | ICD-10-CM | POA: Diagnosis not present

## 2017-08-26 DIAGNOSIS — M545 Low back pain: Secondary | ICD-10-CM | POA: Diagnosis not present

## 2017-08-26 DIAGNOSIS — I714 Abdominal aortic aneurysm, without rupture: Secondary | ICD-10-CM | POA: Diagnosis not present

## 2017-08-26 DIAGNOSIS — J449 Chronic obstructive pulmonary disease, unspecified: Secondary | ICD-10-CM | POA: Diagnosis not present

## 2017-08-26 DIAGNOSIS — I779 Disorder of arteries and arterioles, unspecified: Secondary | ICD-10-CM | POA: Diagnosis not present

## 2017-08-26 DIAGNOSIS — Z6830 Body mass index (BMI) 30.0-30.9, adult: Secondary | ICD-10-CM | POA: Diagnosis not present

## 2017-09-25 DIAGNOSIS — J449 Chronic obstructive pulmonary disease, unspecified: Secondary | ICD-10-CM | POA: Diagnosis not present

## 2017-10-01 ENCOUNTER — Other Ambulatory Visit: Payer: Self-pay | Admitting: Acute Care

## 2017-10-01 DIAGNOSIS — Z122 Encounter for screening for malignant neoplasm of respiratory organs: Secondary | ICD-10-CM

## 2017-10-01 DIAGNOSIS — Z87891 Personal history of nicotine dependence: Secondary | ICD-10-CM

## 2017-10-01 NOTE — Progress Notes (Signed)
Chest ct

## 2017-10-08 ENCOUNTER — Other Ambulatory Visit (INDEPENDENT_AMBULATORY_CARE_PROVIDER_SITE_OTHER): Payer: Self-pay | Admitting: Physical Medicine and Rehabilitation

## 2017-10-08 DIAGNOSIS — M25551 Pain in right hip: Secondary | ICD-10-CM

## 2017-10-08 NOTE — Telephone Encounter (Signed)
Faxed

## 2017-10-08 NOTE — Telephone Encounter (Signed)
Please advise 

## 2017-10-29 DIAGNOSIS — J441 Chronic obstructive pulmonary disease with (acute) exacerbation: Secondary | ICD-10-CM | POA: Diagnosis not present

## 2017-10-29 DIAGNOSIS — K5900 Constipation, unspecified: Secondary | ICD-10-CM | POA: Diagnosis not present

## 2017-10-29 DIAGNOSIS — E669 Obesity, unspecified: Secondary | ICD-10-CM | POA: Diagnosis not present

## 2017-10-29 DIAGNOSIS — Z6834 Body mass index (BMI) 34.0-34.9, adult: Secondary | ICD-10-CM | POA: Diagnosis not present

## 2017-11-24 ENCOUNTER — Ambulatory Visit
Admission: RE | Admit: 2017-11-24 | Discharge: 2017-11-24 | Disposition: A | Discharge status: Home / Self Care | Payer: Medicare Other | Source: Ambulatory Visit | Attending: Acute Care | Admitting: Acute Care

## 2017-11-24 DIAGNOSIS — Z87891 Personal history of nicotine dependence: Secondary | ICD-10-CM | POA: Diagnosis not present

## 2017-11-24 DIAGNOSIS — Z122 Encounter for screening for malignant neoplasm of respiratory organs: Secondary | ICD-10-CM

## 2017-12-07 ENCOUNTER — Other Ambulatory Visit: Payer: Self-pay | Admitting: Acute Care

## 2017-12-07 ENCOUNTER — Other Ambulatory Visit (INDEPENDENT_AMBULATORY_CARE_PROVIDER_SITE_OTHER): Payer: Self-pay | Admitting: Physical Medicine and Rehabilitation

## 2017-12-07 DIAGNOSIS — Z87891 Personal history of nicotine dependence: Secondary | ICD-10-CM

## 2017-12-07 DIAGNOSIS — M25551 Pain in right hip: Secondary | ICD-10-CM

## 2017-12-07 DIAGNOSIS — Z122 Encounter for screening for malignant neoplasm of respiratory organs: Secondary | ICD-10-CM

## 2017-12-07 NOTE — Telephone Encounter (Signed)
Faxed to patient's pharmacy.  

## 2017-12-07 NOTE — Telephone Encounter (Signed)
Please advise 

## 2018-01-06 DIAGNOSIS — J209 Acute bronchitis, unspecified: Secondary | ICD-10-CM | POA: Diagnosis not present

## 2018-01-06 DIAGNOSIS — R0602 Shortness of breath: Secondary | ICD-10-CM | POA: Diagnosis not present

## 2018-01-06 DIAGNOSIS — R05 Cough: Secondary | ICD-10-CM | POA: Diagnosis not present

## 2018-01-06 DIAGNOSIS — J449 Chronic obstructive pulmonary disease, unspecified: Secondary | ICD-10-CM | POA: Diagnosis not present

## 2018-02-04 ENCOUNTER — Other Ambulatory Visit (INDEPENDENT_AMBULATORY_CARE_PROVIDER_SITE_OTHER): Payer: Self-pay | Admitting: Physical Medicine and Rehabilitation

## 2018-02-04 DIAGNOSIS — M25551 Pain in right hip: Secondary | ICD-10-CM

## 2018-02-04 NOTE — Telephone Encounter (Signed)
Please advise 

## 2018-03-09 ENCOUNTER — Telehealth (INDEPENDENT_AMBULATORY_CARE_PROVIDER_SITE_OTHER): Payer: Self-pay | Admitting: Physical Medicine and Rehabilitation

## 2018-03-09 NOTE — Telephone Encounter (Signed)
Scheduled for 03/31/18 at 1300 with driver.

## 2018-03-09 NOTE — Telephone Encounter (Signed)
Yes if same etc

## 2018-03-30 ENCOUNTER — Encounter (INDEPENDENT_AMBULATORY_CARE_PROVIDER_SITE_OTHER): Payer: Self-pay | Admitting: Physical Medicine and Rehabilitation

## 2018-03-30 ENCOUNTER — Encounter

## 2018-03-30 ENCOUNTER — Ambulatory Visit (INDEPENDENT_AMBULATORY_CARE_PROVIDER_SITE_OTHER): Payer: Medicare Other

## 2018-03-30 ENCOUNTER — Ambulatory Visit (INDEPENDENT_AMBULATORY_CARE_PROVIDER_SITE_OTHER): Payer: Medicare Other | Admitting: Physical Medicine and Rehabilitation

## 2018-03-30 VITALS — BP 135/83 | HR 75

## 2018-03-30 DIAGNOSIS — F1190 Opioid use, unspecified, uncomplicated: Secondary | ICD-10-CM

## 2018-03-30 DIAGNOSIS — M545 Low back pain: Secondary | ICD-10-CM

## 2018-03-30 DIAGNOSIS — M961 Postlaminectomy syndrome, not elsewhere classified: Secondary | ICD-10-CM

## 2018-03-30 DIAGNOSIS — M5416 Radiculopathy, lumbar region: Secondary | ICD-10-CM | POA: Diagnosis not present

## 2018-03-30 DIAGNOSIS — G8929 Other chronic pain: Secondary | ICD-10-CM

## 2018-03-30 DIAGNOSIS — M5136 Other intervertebral disc degeneration, lumbar region: Secondary | ICD-10-CM

## 2018-03-30 MED ORDER — BETAMETHASONE SOD PHOS & ACET 6 (3-3) MG/ML IJ SUSP
12.0000 mg | Freq: Once | INTRAMUSCULAR | Status: AC
  Administered 2018-03-30: 12 mg

## 2018-03-30 NOTE — Patient Instructions (Signed)

## 2018-03-30 NOTE — Progress Notes (Signed)
.  Numeric Pain Rating Scale and Functional Assessment Average Pain 7   In the last MONTH (on 0-10 scale) has pain interfered with the following?  1. General activity like being  able to carry out your everyday physical activities such as walking, climbing stairs, carrying groceries, or moving a chair?  Rating(7)   +Driver, -BT, -Dye Allergies.   

## 2018-04-02 ENCOUNTER — Other Ambulatory Visit (INDEPENDENT_AMBULATORY_CARE_PROVIDER_SITE_OTHER): Payer: Self-pay | Admitting: Physical Medicine and Rehabilitation

## 2018-04-02 DIAGNOSIS — M25551 Pain in right hip: Secondary | ICD-10-CM

## 2018-04-02 NOTE — Telephone Encounter (Signed)
Please advise 

## 2018-04-05 ENCOUNTER — Telehealth (INDEPENDENT_AMBULATORY_CARE_PROVIDER_SITE_OTHER): Payer: Self-pay | Admitting: Physical Medicine and Rehabilitation

## 2018-04-05 NOTE — Telephone Encounter (Signed)
Called patient to advise. He will let us know if this problem persists or worsens.

## 2018-04-05 NOTE — Telephone Encounter (Signed)
The shot itself would not have affected much, sometimes cramping occurs from steroid. If persistent after a couple of weeks then need to see him in OV or if worsens.

## 2018-05-07 NOTE — Procedures (Signed)
Lumbosacral Transforaminal Epidural Steroid Injection - Sub-Pedicular Approach with Fluoroscopic Guidance  Patient: Randall [name]      Date of Birth: 04/19/1946 MRN: [id number] PCP: Hulan Fess, MD      Visit Date: 03/30/2018   Universal Protocol:    Date/Time: 03/30/2018  Consent Given By: the patient  Position: PRONE  Additional Comments: Vital signs were monitored before and after the procedure. Patient was prepped and draped in the usual sterile fashion. The correct patient, procedure, and site was verified.   Injection Procedure Details:  Procedure Site One Meds Administered:  Meds ordered this encounter  Medications  . betamethasone acetate-betamethasone sodium phosphate (CELESTONE) injection 12 mg    Laterality: Bilateral  Location/Site:  L4-L5  Needle size: 22 G  Needle type: Spinal  Needle Placement: Transforaminal  Findings:    -Comments: Excellent flow of contrast along the nerve and into the epidural space.  Procedure Details: After squaring off the end-plates to get a true AP view, the C-arm was positioned so that an oblique view of the foramen as noted above was visualized. The target area is just inferior to the "nose of the scotty dog" or sub pedicular. The soft tissues overlying this structure were infiltrated with 2-3 ml. of 1% Lidocaine without Epinephrine.  The spinal needle was inserted toward the target using a "trajectory" view along the fluoroscope beam.  Under AP and lateral visualization, the needle was advanced so it did not puncture dura and was located close the 6 O'Clock position of the pedical in AP tracterory. Biplanar projections were used to confirm position. Aspiration was confirmed to be negative for CSF and/or blood. A 1-2 ml. volume of Isovue-250 was injected and flow of contrast was noted at each level. Radiographs were obtained for documentation purposes.   After attaining the desired flow of contrast documented above, a 0.5 to  1.0 ml test dose of 0.25% Marcaine was injected into each respective transforaminal space.  The patient was observed for 90 seconds post injection.  After no sensory deficits were reported, and normal lower extremity motor function was noted,   the above injectate was administered so that equal amounts of the injectate were placed at each foramen (level) into the transforaminal epidural space.   Additional Comments:  The patient tolerated the procedure well Dressing: Band-Aid    Post-procedure details: Patient was observed during the procedure. Post-procedure instructions were reviewed.  Patient left the clinic in stable condition.

## 2018-05-07 NOTE — Progress Notes (Signed)
Randall [name] - 72 y.o. male MRN [id number]  Date of birth: 10/11/1946  Office Visit Note: Visit Date: 03/30/2018 PCP: Hulan Fess, MD Referred by: Hulan Fess, MD  Subjective: Chief Complaint  Patient presents with  . Lower Back - Pain  . Right Hip - Pain  . Left Hip - Pain   HPI: Randall [name] is a 72 year old gentleman that we have known quite well over the last several years.  He has been a patient of Dr. Lorin Mercy but more recently I have seen him off and on for chronic use of tramadol which he uses a very mild amount about twice a day.  We have maintained him on this and cautioned him that we would not really increase any levels that he probably would need to see somebody in chronic pain management if that were to occur but he seems to be doing okay with a very small amount of tramadol.  We have checked the database is routinely and he does not show any aberrant use.  He has a history of lumbar discectomy and laminectomy.  He has a history of cervical fusion.  He comes in today with worsening bilateral low back and bilateral posterior lateral hip pain without groin pain.  He reports bending over and walking make the pain worse and nothing really makes the pain much better.  We have updated MRI is fairly recently and this again shows degenerative changes around the area of the laminectomy discectomy with some foraminal and lateral recess narrowing but nothing real significant from a standpoint of tightness.  He has had no new specific injuries.  Last time we saw him was in August 2018 and completed epidural injection at that point.  He is also had a history of initial bursitis where the hamstring insertion.  We completed an injection at this level last year as well that was beneficial.  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  Again is not endorsing any groin pain.  He has no true radicular pain past the knee but he does get some referral into the hips.  He has not had any ongoing  other pain issues per se.  It does really limit what he can do and is very stiff in the morning and getting to do anything.  He rates his pain is a 7 out of 10.  He does use an anti-inflammatory as well as the tramadol.   Review of Systems  Constitutional: Negative for chills, fever, malaise/fatigue and weight loss.  HENT: Negative for hearing loss and sinus pain.   Eyes: Negative for blurred vision, double vision and photophobia.  Respiratory: Negative for cough and shortness of breath.   Cardiovascular: Negative for chest pain, palpitations and leg swelling.  Gastrointestinal: Negative for abdominal pain, nausea and vomiting.  Genitourinary: Negative for flank pain.  Musculoskeletal: Positive for back pain and joint pain. Negative for myalgias.  Skin: Negative for itching and rash.  Neurological: Negative for tremors, focal weakness and weakness.  Endo/Heme/Allergies: Negative.   Psychiatric/Behavioral: Negative for depression.  All other systems reviewed and are negative.  Otherwise per HPI.  Assessment & Plan: Visit Diagnoses:  1. Lumbar radiculopathy   2. Chronic bilateral low back pain without sciatica   3. Degenerative disc disease, lumbar   4. Post laminectomy syndrome   5. Chronic, continuous use of opioids     Plan: Findings:  Chronic worsening severe at times mostly axial low back pain some referral in the posterior buttock  and hamstring and lateral hip.  No real focal pain over the hamstring insertion or ischial bursa.  No pain with hip rotation although he is stiff.  He has good distal strength on exam.  He does have pain with facet joint loading and extension.  Prior facet joint blocks have been less beneficial for him.  We have completed radiofrequency ablation in the past with only really mild results.  I think the best plan today is to repeat a bilateral L4 transforaminal epidural steroid injection.  Depending on relief would still consider potential look at radiofrequency  ablation.  We discussed spinal cord stimulator trial as well I think he would be a good candidate for that.  Last on the differential list would be the initial bursitis but not really tender over that insertion point today.  Chronic prolonged use of tramadol.  Patient takes 1 to 2/day without escalating doses.  He has had no signs of aberrant use.  We have checked the database is regularly with him.  We will continue to maintain with this medication if it is helping.    Meds & Orders:  Meds ordered this encounter  Medications  . betamethasone acetate-betamethasone sodium phosphate (CELESTONE) injection 12 mg    Orders Placed This Encounter  Procedures  . XR C-ARM NO REPORT  . Epidural Steroid injection    Follow-up: Return if symptoms worsen or fail to improve.   Procedures: No procedures performed  Lumbosacral Transforaminal Epidural Steroid Injection - Sub-Pedicular Approach with Fluoroscopic Guidance  Patient: Randall [name]      Date of Birth: 1946-08-24 MRN: [id number] PCP: Hulan Fess, MD      Visit Date: 03/30/2018   Universal Protocol:    Date/Time: 03/30/2018  Consent Given By: the patient  Position: PRONE  Additional Comments: Vital signs were monitored before and after the procedure. Patient was prepped and draped in the usual sterile fashion. The correct patient, procedure, and site was verified.   Injection Procedure Details:  Procedure Site One Meds Administered:  Meds ordered this encounter  Medications  . betamethasone acetate-betamethasone sodium phosphate (CELESTONE) injection 12 mg    Laterality: Bilateral  Location/Site:  L4-L5  Needle size: 22 G  Needle type: Spinal  Needle Placement: Transforaminal  Findings:    -Comments: Excellent flow of contrast along the nerve and into the epidural space.  Procedure Details: After squaring off the end-plates to get a true AP view, the C-arm was positioned so that an oblique view of the foramen  as noted above was visualized. The target area is just inferior to the "nose of the scotty dog" or sub pedicular. The soft tissues overlying this structure were infiltrated with 2-3 ml. of 1% Lidocaine without Epinephrine.  The spinal needle was inserted toward the target using a "trajectory" view along the fluoroscope beam.  Under AP and lateral visualization, the needle was advanced so it did not puncture dura and was located close the 6 O'Clock position of the pedical in AP tracterory. Biplanar projections were used to confirm position. Aspiration was confirmed to be negative for CSF and/or blood. A 1-2 ml. volume of Isovue-250 was injected and flow of contrast was noted at each level. Radiographs were obtained for documentation purposes.   After attaining the desired flow of contrast documented above, a 0.5 to 1.0 ml test dose of 0.25% Marcaine was injected into each respective transforaminal space.  The patient was observed for 90 seconds post injection.  After no sensory deficits were reported,  and normal lower extremity motor function was noted,   the above injectate was administered so that equal amounts of the injectate were placed at each foramen (level) into the transforaminal epidural space.   Additional Comments:  The patient tolerated the procedure well Dressing: Band-Aid    Post-procedure details: Patient was observed during the procedure. Post-procedure instructions were reviewed.  Patient left the clinic in stable condition.    Clinical History: MRI LUMBAR SPINE WITHOUT CONTRAST  TECHNIQUE: Multiplanar, multisequence MR imaging of the lumbar spine was performed. No intravenous contrast was administered.  COMPARISON:  KUB 01/01/2015.  Lumbar MRI 10/04/2014 and earlier.  FINDINGS: Same numbering system as in 2015 designating normal lumbar segmentation. Chronic postoperative changes to the posterior elements of L4. Associated posterior paraspinal postoperative changes  appear stable. Stable vertebral height and alignment since 2015. No marrow edema or evidence of acute osseous abnormality.  Visualized lower thoracic spinal cord is normal with conus medularis at L1.  Multiple chronic bilateral renal cysts re- demonstrated. Visible cysts appear stable since 2015.  Enlargement of the infrarenal abdominal aorta, estimated at up to 31 mm diameter (at the L3 level). This measured about 29 mm in 2013.  T11-T12:  Negative.  T12-L1:  Negative.  L1-L2:  Stable mild facet hypertrophy.  No significant stenosis.  L2-L3: Minimal disc bulge eccentric to the left appears stable. Mild to moderate facet hypertrophy is stable including right facet joint fluid. No significant stenosis.  L3-L4: Chronic circumferential disc bulge eccentric to the left with broad-based left foraminal component of disc re- demonstrated. Moderate facet hypertrophy with chronic facet joint fluid. There may be postoperative changes here to the left lateral recess where there is either chronic stenosis or postoperative architectural distortion which appears stable. There is moderate left L3 foraminal stenosis which is stable. No significant spinal stenosis.  L4-L5: Chronic decompressive laminectomy. Stable chronic circumferential disc bulge and mild facet hypertrophy. No significant spinal or lateral recess stenosis. Mild bilateral L4 foraminal stenosis is stable and mostly related to disc and endplate spurring.  L5-S1:  Negative disc.  Stable mild facet hypertrophy.  No stenosis.  IMPRESSION: 1. Stable MRI appearance of the lumbar spine since December 2015. Postoperative changes at L4-L5, and possibly also at L3-L4 on the left. No significant spinal stenosis. Stable moderate left lateral recess and foraminal stenosis at L3-L4. Stable mild foraminal stenosis at L4-L5. 2. Chronic mild infrarenal abdominal aortic aneurysm, now 31 mm diameter. Recommend followup by Aorta  Ultrasound in 3 years. This recommendation follows ACR consensus guidelines: White Paper of the ACR Incidental Findings Committee II on Vascular Findings. J Am Coll Radiol 2013; 10:789-794.   Electronically Signed   By: Genevie Ann M.D.   On: 01/18/2016 18:56   He reports that he has quit smoking. He has a 40.00 pack-year smoking history. He has never used smokeless tobacco. No results for input(s): HGBA1C, LABURIC in the last 8760 hours.  Objective:  VS:  HT:    WT:   BMI:     BP:135/83  HR:75bpm  TEMP: ( )  RESP:  Physical Exam  Constitutional: He is oriented to person, place, and time. He appears well-developed and well-nourished. No distress.  HENT:  Head: Normocephalic and atraumatic.  Nose: Nose normal.  Mouth/Throat: Oropharynx is clear and moist.  Eyes: Pupils are equal, round, and reactive to light. Conjunctivae are normal.  Neck: Normal range of motion. Neck supple. No tracheal deviation present.  Cardiovascular: Regular rhythm and intact distal pulses.  Pulmonary/Chest:  Effort normal and breath sounds normal.  Abdominal: Soft. He exhibits no distension. There is no rebound and no guarding.  Musculoskeletal: He exhibits no edema or deformity.  Patient is very slow to arise from a seated position but ambulates without a cane.  He has good distal strength without clonus.  He has stiffness with hip rotation internally more on the right than left.  He has no groin pain with rotation.  No pain over the greater trochanters.  He is pretty tender throughout the paraspinal musculature and quadratus lumborum.  No real focal trigger point.  He has a negative slump test bilaterally.  Neurological: He is alert and oriented to person, place, and time. He exhibits normal muscle tone. Coordination normal.  Skin: Skin is warm. No rash noted.  Psychiatric: He has a normal mood and affect. His behavior is normal.  Nursing note and vitals reviewed.   Ortho Exam Imaging: No results  found.  Past Medical/Family/Surgical/Social History: Medications & Allergies reviewed per EMR, new medications updated. Patient Active Problem List   Diagnosis Date Noted  . Post laminectomy syndrome 01/17/2017  . Ischial bursitis of right side 01/15/2017  . Pain in right hip 01/15/2017  . Chronic bilateral low back pain without sciatica 01/15/2017  . HNP (herniated nucleus pulposus), lumbar 05/15/2014  . S/P cervical spinal fusion 12/19/2011    Class: Diagnosis of  . 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 Kidney stones    1 removed by litho. 1 by Cysto  . RLS (restless legs syndrome)    Family History  Problem Relation Age of Onset  . Anesthesia problems Neg Hx    Past Surgical History:  Procedure Laterality Date  . Cervial  May 2012   4-5, 6-7  . LUMBAR LAMINECTOMY N/A 05/15/2014   Procedure: MICRODISCECTOMY LUMBAR LAMINECTOMY;  Surgeon: Marybelle Killings, MD;  Location: [place];  Service: Orthopedics;  Laterality: N/A;  L3-4, L4-5 Decompression, Left L3-4, L4-5 Microdiscectomy  . POSTERIOR CERVICAL FUSION/FORAMINOTOMY  12/19/2011   Procedure: POSTERIOR CERVICAL FUSION/FORAMINOTOMY LEVEL 2;  Surgeon: Marybelle Killings, MD;  Location: [facility];  Service: Orthopedics;  Laterality: N/A;  Posterior Cervical Fusion, C4-5, C6-7 Wiring, Vitoss, Iliac aspirate  . TONSILLECTOMY     Social History   Occupational History  . Not on file  Tobacco Use  . Smoking status: Former Smoker    Packs/day: 1.00    Years: 40.00    Pack years: 40.00  . Smokeless tobacco: Never Used  . Tobacco comment: Encouraged to remain smoke free.  Substance and Sexual Activity  . Alcohol use: Yes    Comment: occ  . Drug use: No  . Sexual activity: Not on file

## 2018-05-20 ENCOUNTER — Ambulatory Visit (INDEPENDENT_AMBULATORY_CARE_PROVIDER_SITE_OTHER): Payer: Self-pay

## 2018-05-20 ENCOUNTER — Ambulatory Visit (INDEPENDENT_AMBULATORY_CARE_PROVIDER_SITE_OTHER): Payer: Medicare Other | Admitting: Physical Medicine and Rehabilitation

## 2018-05-20 ENCOUNTER — Encounter (INDEPENDENT_AMBULATORY_CARE_PROVIDER_SITE_OTHER): Payer: Self-pay | Admitting: Physical Medicine and Rehabilitation

## 2018-05-20 DIAGNOSIS — M961 Postlaminectomy syndrome, not elsewhere classified: Secondary | ICD-10-CM | POA: Diagnosis not present

## 2018-05-20 DIAGNOSIS — G8929 Other chronic pain: Secondary | ICD-10-CM | POA: Diagnosis not present

## 2018-05-20 DIAGNOSIS — M545 Low back pain: Secondary | ICD-10-CM | POA: Diagnosis not present

## 2018-05-20 DIAGNOSIS — F1190 Opioid use, unspecified, uncomplicated: Secondary | ICD-10-CM

## 2018-05-20 DIAGNOSIS — M7071 Other bursitis of hip, right hip: Secondary | ICD-10-CM

## 2018-05-20 DIAGNOSIS — M25551 Pain in right hip: Secondary | ICD-10-CM | POA: Diagnosis not present

## 2018-05-20 MED ORDER — BACLOFEN 10 MG PO TABS: ORAL_TABLET | ORAL | 0 refills | Status: DC

## 2018-05-20 NOTE — Progress Notes (Signed)
 .  Numeric Pain Rating Scale and Functional Assessment Average Pain 6   In the last MONTH (on 0-10 scale) has pain interfered with the following?  1. General activity like being  able to carry out your everyday physical activities such as walking, climbing stairs, carrying groceries, or moving a chair?  Rating(4)   +Driver, -BT, -Dye Allergies.  

## 2018-05-31 ENCOUNTER — Telehealth (INDEPENDENT_AMBULATORY_CARE_PROVIDER_SITE_OTHER): Payer: Self-pay | Admitting: Physical Medicine and Rehabilitation

## 2018-05-31 DIAGNOSIS — M7071 Other bursitis of hip, right hip: Secondary | ICD-10-CM

## 2018-05-31 DIAGNOSIS — F1190 Opioid use, unspecified, uncomplicated: Secondary | ICD-10-CM | POA: Insufficient documentation

## 2018-05-31 MED ORDER — BUPIVACAINE HCL 0.5 % IJ SOLN
3.0000 mL | INTRAMUSCULAR | Status: AC | PRN
  Administered 2018-05-31: 3 mL via INTRA_ARTICULAR

## 2018-05-31 MED ORDER — TRIAMCINOLONE ACETONIDE 40 MG/ML IJ SUSP
80.0000 mg | INTRAMUSCULAR | Status: AC | PRN
  Administered 2018-05-31: 80 mg via INTRA_ARTICULAR

## 2018-05-31 NOTE — Telephone Encounter (Signed)
1) if helped greatly then needs short course PT 2)if did not help much then needs Lspine MRI 3) considering getting blood work for rheum screen - ask if baclofen tolerated/helped

## 2018-05-31 NOTE — Progress Notes (Signed)
Randall [name] - 72 y.o. male MRN [id number]  Date of birth: [birth year]  Office Visit Note: Visit Date: 05/20/2018 PCP: Hulan Fess, MD Referred by: Hulan Fess, MD  Subjective: Chief Complaint  Patient presents with  . Lower Back - Pain  . Right Hip - Pain   HPI: Randall [name] is a 72 year old gentleman that we have been seeing for quite some time and he used to be followed extensively by Dr. Rodell Perna.  He is status post 2 lumbar surgeries which were discectomies and laminectomies as well as prior cervical fusion.  Initially did well with epidural injection as well as some relief with the radiofrequency ablation around the L4-5 region.  Most recently we have seen him for more worsening right hip pain that initially we felt like was related to his hip but with really mild findings on x-ray.  Initial hip injection using fluoroscopic guidance seem to be diagnostic during the anesthetic phase.  The patient is sometimes a hard historian to really get straight what is hurting and when.  Nonetheless we did end up with an MRI of his hip which did not really show any degenerative changes of the hip that were significant but he did have considerable inflammation or high signal intensity around the ischial bursa.  Initial bursa injection gave him quite a bit of relief.  His biggest complaint is the ability to really walk and sit for a long period of time the sitting is really problematic right over the ischial bursa.  The walking is different in the sense that he gets back pain and weakness or pain in the legs.  Lumbar spine MRI has not shown any central canal narrowing but some lateral recess narrowing.  This hip pain is been ongoing now for 8 months and total and did get temporary relief with the bursa injection.  He really talks a lot about the stiffness and really spasming he gets through the lower extremities and right side particularly.  He rates his pain as a 6 out of 10.  He does use tramadol  chronically and he is taking twice a day.  We reviewed the controlled substance database and this is appropriate usage.  He has not had recent physical therapy of the hip.  Again lumbar spine MRI was from 2017.  He has no specific new injuries just worsening pain over the last many months.  He seems somewhat distraught and frustrated however even though in the past he is done fairly well with the injection treatment and medication.  He feels like he just cannot do the things he wants to do.   Review of Systems  Constitutional: Negative for chills, fever, malaise/fatigue and weight loss.  HENT: Negative for hearing loss and sinus pain.   Eyes: Negative for blurred vision, double vision and photophobia.  Respiratory: Negative for cough and shortness of breath.   Cardiovascular: Negative for chest pain, palpitations and leg swelling.  Gastrointestinal: Negative for abdominal pain, nausea and vomiting.  Genitourinary: Negative for flank pain.  Musculoskeletal: Positive for back pain and joint pain. Negative for myalgias.  Skin: Negative for itching and rash.  Neurological: Negative for tremors, focal weakness and weakness.  Endo/Heme/Allergies: Negative.   Psychiatric/Behavioral: Negative for depression.  All other systems reviewed and are negative.  Otherwise per HPI.  Assessment & Plan: Visit Diagnoses:  1. Pain in right hip   2. Ischial bursitis of right side   3. Chronic bilateral low back pain without sciatica  4. Post laminectomy syndrome   5. Uncomplicated opioid use     Plan: Findings:  1.  Chronic 8 months of right hip pain which part of it is clearly ischial bursitis for some reason and I think this is the fact that he from a mechanical standpoint has pain through the lumbar spine and has had 2 prior back surgeries with significant degenerative disc changes.  I think this somehow has set him up from a standpoint of walking and ambulating where he is getting this bursitis of the  issue him.  This was confirmed on MRI and diagnostic injection was beneficial short-term.  We are going to repeat the injection today of the ischial bursa.  Depending on relief would also have him regroup with a physical therapist short-term to see if they can look at getting him some motion of the lower back and hips and see if this would work itself out that way.  Conversely we would look at MRI of the lumbar spine as a repeat MRI although the last one was 2 years ago after I think he is concerned that something bad is happening in his lumbar spine.  He does not have pain shooting down the legs but he does get a lot of pain with walking and standing.  We are going to add baclofen as a trial muscle relaxer to see if that helps with any of the stiffness that he has when he walks.  I would consider lab work and he can talk to his primary care physician about this as well but this would be for rheumatologic screening.  Patient has a long-term smoking history but did quit smoking a couple years ago.  Lastly the patient may be candidate for spinal cord stimulator trial.  This is been discussed with him in the past.    Meds & Orders:  Meds ordered this encounter  Medications  . baclofen (LIORESAL) 10 MG tablet    Sig: Take 1/2 to 1 by mouth every 8hrs as needed for spasm    Dispense:  60 tablet    Refill:  0    Orders Placed This Encounter  Procedures  . Large Joint Inj  . XR C-ARM NO REPORT    Follow-up: Return if symptoms worsen or fail to improve.   Procedures: Large Joint Inj (R. Ischium) on 05/31/2018 6:17 AM Indications: diagnostic evaluation and pain Details: 22 G 3.5 in needle, fluoroscopy-guided posterior approach  Arthrogram: No  Medications: 80 mg triamcinolone acetonide 40 MG/ML; 3 mL bupivacaine 0.5 % Outcome: tolerated well, no immediate complications  There was excellent flow of contrast producing bursal flow pattern over the right ischium. Some initial relief noted. Procedure,  treatment alternatives, risks and benefits explained, specific risks discussed. Consent was given by the patient. Immediately prior to procedure a time out was called to verify the correct patient, procedure, equipment, support staff and site/side marked as required. Patient was prepped and draped in the usual sterile fashion.      No notes on file   Clinical History: MRI of right hip IMPRESSION: 1. No stress fracture or AVN. 2. Bilateral peritendinosis, right greater than left. No associated trochanteric bursitis. 3. Moderate right-sided hamstring tendinopathy with surrounding inflammation/bursitis and reactive marrow edema in the ischium. This could be the source of the patient's pain. Recommend clinical correlation. Mild hamstring tendinopathy on the left. 4. No significant intrapelvic abnormalities.   Electronically Signed   By: Marijo Sanes M.D.   On: 01/14/2017 09:06  MRI LUMBAR SPINE WITHOUT CONTRAST  TECHNIQUE: Multiplanar, multisequence MR imaging of the lumbar spine was performed. No intravenous contrast was administered.  COMPARISON: [facility] 01/01/2015. Lumbar MRI 10/04/2014 and earlier.  FINDINGS: Same numbering system as in 2015 designating normal lumbar segmentation. Chronic postoperative changes to the posterior elements of L4. Associated posterior paraspinal postoperative changes appear stable. Stable vertebral height and alignment since 2015. No marrow edema or evidence of acute osseous abnormality.  Visualized lower thoracic spinal cord is normal with conus medularis at L1.  Multiple chronic bilateral renal cysts re- demonstrated. Visible cysts appear stable since 2015.  Enlargement of the infrarenal abdominal aorta, estimated at up to 31 mm diameter (at the L3 level). This measured about 29 mm in 2013.  T11-T12: Negative.  T12-L1: Negative.  L1-L2: Stable mild facet hypertrophy. No significant stenosis.  L2-L3: Minimal disc bulge  eccentric to the left appears stable. Mild to moderate facet hypertrophy is stable including right facet joint fluid. No significant stenosis.  L3-L4: Chronic circumferential disc bulge eccentric to the left with broad-based left foraminal component of disc re- demonstrated. Moderate facet hypertrophy with chronic facet joint fluid. There may be postoperative changes here to the left lateral recess where there is either chronic stenosis or postoperative architectural distortion which appears stable. There is moderate left L3 foraminal stenosis which is stable. No significant spinal stenosis.  L4-L5: Chronic decompressive laminectomy. Stable chronic circumferential disc bulge and mild facet hypertrophy. No significant spinal or lateral recess stenosis. Mild bilateral L4 foraminal stenosis is stable and mostly related to disc and endplate spurring.  L5-S1: Negative disc. Stable mild facet hypertrophy. No stenosis.  IMPRESSION: 1. Stable MRI appearance of the lumbar spine since December 2015. Postoperative changes at L4-L5, and possibly also at L3-L4 on the left. No significant spinal stenosis. Stable moderate left lateral recess and foraminal stenosis at L3-L4. Stable mild foraminal stenosis at L4-L5. 2. Chronic mild infrarenal abdominal aortic aneurysm, now 31 mm diameter. Recommend followup by Aorta Ultrasound in 3 years. This recommendation follows ACR consensus guidelines: White Paper of the ACR Incidental Findings Committee II on Vascular Findings. J Am Coll Radiol 2013; 10:789-794.   Electronically Signed By: Genevie Ann M.D. On: 01/18/2016 18:56   He reports that he has quit smoking. He has a 40.00 pack-year smoking history. He has never used smokeless tobacco. No results for input(s): HGBA1C, LABURIC in the last 8760 hours.  Objective:  VS:  HT:    WT:   BMI:     BP:   HR: bpm  TEMP: ( )  RESP:  Physical Exam  Constitutional: He is oriented to person, place, and  time. He appears well-developed and well-nourished. No distress.  HENT:  Head: Normocephalic and atraumatic.  Eyes: Pupils are equal, round, and reactive to light. Conjunctivae are normal.  Neck: Normal range of motion. Neck supple.  Cardiovascular: Regular rhythm and intact distal pulses.  Pulmonary/Chest: Effort normal. No respiratory distress.  Musculoskeletal:  Patient has difficulty going from sit to stand he has pain with extension and facet joint loading of the lumbar spine.  He is very stiff the lumbar spine in general with any sort of motion.  He does have pain over the right ischial bursa which is concordant with some of his buttock and hip pain.  No pain over the greater trochanter.  He has no pain with hip rotation at least in the groin although he is very stiff.  Neurological: He is alert and oriented to person, place, and  time. He exhibits abnormal muscle tone. Coordination normal.  Patient seems very stiff in general but no real cogwheeling.  No clonus bilaterally.  Skin: Skin is warm and dry. No rash noted. No erythema.  Psychiatric: He has a normal mood and affect.  Nursing note and vitals reviewed.   Ortho Exam Imaging: No results found.  Past Medical/Family/Surgical/Social History: Medications & Allergies reviewed per EMR, new medications updated. Patient Active Problem List   Diagnosis Date Noted  . Uncomplicated opioid use 43/32/9518  . Post laminectomy syndrome 01/17/2017  . Ischial bursitis of right side 01/15/2017  . Pain in right hip 01/15/2017  . Chronic bilateral low back pain without sciatica 01/15/2017  . HNP (herniated nucleus pulposus), lumbar 05/15/2014  . S/P cervical spinal fusion 12/19/2011    Class: Diagnosis of  . 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 Kidney stones    1 removed by litho. 1 by Cysto  . RLS (restless legs syndrome)    Family History  Problem  Relation Age of Onset  . Anesthesia problems Neg Hx    Past Surgical History:  Procedure Laterality Date  . Cervial  May 2012   4-5, 6-7  . LUMBAR LAMINECTOMY N/A 05/15/2014   Procedure: MICRODISCECTOMY LUMBAR LAMINECTOMY;  Surgeon: Marybelle Killings, MD;  Location: [facility];  Service: Orthopedics;  Laterality: N/A;  L3-4, L4-5 Decompression, Left L3-4, L4-5 Microdiscectomy  . POSTERIOR CERVICAL FUSION/FORAMINOTOMY  12/19/2011   Procedure: POSTERIOR CERVICAL FUSION/FORAMINOTOMY LEVEL 2;  Surgeon: Marybelle Killings, MD;  Location: [facility];  Service: Orthopedics;  Laterality: N/A;  Posterior Cervical Fusion, C4-5, C6-7 Wiring, Vitoss, Iliac aspirate  . TONSILLECTOMY     Social History   Occupational History  . Not on file  Tobacco Use  . Smoking status: Former Smoker    Packs/day: 1.00    Years: 40.00    Pack years: 40.00  . Smokeless tobacco: Never Used  . Tobacco comment: Encouraged to remain smoke free.  Substance and Sexual Activity  . Alcohol use: Yes    Comment: occ  . Drug use: No  . Sexual activity: Not on file

## 2018-06-01 NOTE — Telephone Encounter (Signed)
PT is best recommendation I have at this point versus seeing Dr. Ninfa Linden from an orthopedic standpoint concerning the the hip/ischium etc.

## 2018-06-01 NOTE — Telephone Encounter (Signed)
Left message for patient to call back to discuss.

## 2018-06-01 NOTE — Telephone Encounter (Signed)
Patient states that injection gave him a good amount of relief at first. He states that he "is not too keen on physical therapy" and that he has some exercise machines at home that he can use. He states that he was not able to tolerate the Baclofen due to increased urination. Please advise.

## 2018-06-02 ENCOUNTER — Other Ambulatory Visit (INDEPENDENT_AMBULATORY_CARE_PROVIDER_SITE_OTHER): Payer: Self-pay | Admitting: Physical Medicine and Rehabilitation

## 2018-06-02 MED ORDER — METHOCARBAMOL 500 MG PO TABS: 500.0000 mg | ORAL_TABLET | Freq: Four times a day (QID) | ORAL | 2 refills | Status: DC | PRN

## 2018-06-02 NOTE — Telephone Encounter (Signed)
Left message to notify patient.

## 2018-06-02 NOTE — Progress Notes (Signed)
rx for robaxin

## 2018-06-02 NOTE — Telephone Encounter (Signed)
Patient is going to wait and try rest to see if that helps with his pain. He asked if there is another muscle relaxer that he can try. Please advise.

## 2018-06-02 NOTE — Telephone Encounter (Signed)
Sent rx for [employer]

## 2018-06-03 ENCOUNTER — Other Ambulatory Visit (INDEPENDENT_AMBULATORY_CARE_PROVIDER_SITE_OTHER): Payer: Self-pay | Admitting: Physical Medicine and Rehabilitation

## 2018-06-03 DIAGNOSIS — M25551 Pain in right hip: Secondary | ICD-10-CM

## 2018-06-03 NOTE — Telephone Encounter (Signed)
Please Advise

## 2018-06-03 NOTE — Telephone Encounter (Signed)
Can you please since Dr. Ernestina Patches is out of the office? Thanks.

## 2018-06-03 NOTE — Telephone Encounter (Signed)
Called to pharmacy 

## 2018-06-10 ENCOUNTER — Telehealth (INDEPENDENT_AMBULATORY_CARE_PROVIDER_SITE_OTHER): Payer: Self-pay | Admitting: Physical Medicine and Rehabilitation

## 2018-06-10 DIAGNOSIS — M25551 Pain in right hip: Secondary | ICD-10-CM

## 2018-06-10 DIAGNOSIS — M5416 Radiculopathy, lumbar region: Secondary | ICD-10-CM

## 2018-06-10 DIAGNOSIS — M961 Postlaminectomy syndrome, not elsewhere classified: Secondary | ICD-10-CM

## 2018-06-10 DIAGNOSIS — M7071 Other bursitis of hip, right hip: Secondary | ICD-10-CM

## 2018-06-10 NOTE — Telephone Encounter (Signed)
Orders placed and patient notified.

## 2018-06-10 NOTE — Telephone Encounter (Signed)
error 

## 2018-06-10 NOTE — Telephone Encounter (Signed)
Yes I think at this point we need to update Lspine MRI as he is having this worsening pain and stiffness, also would like to get Rheum Screen lab tests if possible/blood work

## 2018-06-10 NOTE — Addendum Note (Signed)
Addended by: Sherre Scarlet B on: 06/10/2018 10:10 AM   Modules accepted: Orders

## 2018-06-11 DIAGNOSIS — M7071 Other bursitis of hip, right hip: Secondary | ICD-10-CM | POA: Diagnosis not present

## 2018-06-11 DIAGNOSIS — M25551 Pain in right hip: Secondary | ICD-10-CM | POA: Diagnosis not present

## 2018-06-15 ENCOUNTER — Encounter (INDEPENDENT_AMBULATORY_CARE_PROVIDER_SITE_OTHER): Payer: Self-pay | Admitting: Physical Medicine and Rehabilitation

## 2018-06-15 LAB — ANA: ANA: NEGATIVE

## 2018-06-15 LAB — SEDIMENTATION RATE: SED RATE: 2 mm/h (ref 0–20)

## 2018-06-15 LAB — URIC ACID: URIC ACID, SERUM: 6.1 mg/dL (ref 4.0–8.0)

## 2018-06-15 LAB — RHEUMATOID FACTOR: Rhuematoid fact SerPl-aCnc: <14 IU/mL (ref <14)

## 2018-06-21 ENCOUNTER — Ambulatory Visit
Admission: RE | Admit: 2018-06-21 | Discharge: 2018-06-21 | Disposition: A | Discharge status: Home / Self Care | Payer: Medicare Other | Source: Ambulatory Visit | Attending: Physical Medicine and Rehabilitation | Admitting: Physical Medicine and Rehabilitation

## 2018-06-21 DIAGNOSIS — M961 Postlaminectomy syndrome, not elsewhere classified: Secondary | ICD-10-CM

## 2018-06-21 DIAGNOSIS — M5416 Radiculopathy, lumbar region: Secondary | ICD-10-CM

## 2018-06-21 DIAGNOSIS — M48061 Spinal stenosis, lumbar region without neurogenic claudication: Secondary | ICD-10-CM | POA: Diagnosis not present

## 2018-07-06 ENCOUNTER — Encounter (INDEPENDENT_AMBULATORY_CARE_PROVIDER_SITE_OTHER): Payer: Self-pay | Admitting: Physical Medicine and Rehabilitation

## 2018-07-06 ENCOUNTER — Ambulatory Visit (INDEPENDENT_AMBULATORY_CARE_PROVIDER_SITE_OTHER): Payer: Medicare Other | Admitting: Physical Medicine and Rehabilitation

## 2018-07-06 VITALS — BP 142/81 | HR 69

## 2018-07-06 DIAGNOSIS — G8929 Other chronic pain: Secondary | ICD-10-CM

## 2018-07-06 DIAGNOSIS — M545 Low back pain, unspecified: Secondary | ICD-10-CM

## 2018-07-06 DIAGNOSIS — M961 Postlaminectomy syndrome, not elsewhere classified: Secondary | ICD-10-CM | POA: Diagnosis not present

## 2018-07-06 DIAGNOSIS — F1190 Opioid use, unspecified, uncomplicated: Secondary | ICD-10-CM

## 2018-07-06 NOTE — Progress Notes (Signed)
 .  Numeric Pain Rating Scale and Functional Assessment Average Pain 8 Pain Right Now 4 My pain is constant, sharp and dull Pain is worse with: walking and standing Pain improves with: rest and medication   In the last MONTH (on 0-10 scale) has pain interfered with the following?  1. General activity like being  able to carry out your everyday physical activities such as walking, climbing stairs, carrying groceries, or moving a chair?  Rating(5)  2. Relation with others like being able to carry out your usual social activities and roles such as  activities at home, at work and in your community. Rating(3)  3. Enjoyment of life such that you have  been bothered by emotional problems such as feeling anxious, depressed or irritable?  Rating(0)

## 2018-07-07 DIAGNOSIS — Z23 Encounter for immunization: Secondary | ICD-10-CM | POA: Diagnosis not present

## 2018-07-12 ENCOUNTER — Ambulatory Visit (INDEPENDENT_AMBULATORY_CARE_PROVIDER_SITE_OTHER): Payer: Medicare Other | Admitting: Physical Medicine and Rehabilitation

## 2018-07-12 ENCOUNTER — Ambulatory Visit (INDEPENDENT_AMBULATORY_CARE_PROVIDER_SITE_OTHER): Payer: Self-pay

## 2018-07-12 ENCOUNTER — Encounter (INDEPENDENT_AMBULATORY_CARE_PROVIDER_SITE_OTHER): Payer: Self-pay | Admitting: Physical Medicine and Rehabilitation

## 2018-07-12 VITALS — BP 127/81 | HR 79 | Temp 97.9°F

## 2018-07-12 DIAGNOSIS — M545 Low back pain, unspecified: Secondary | ICD-10-CM

## 2018-07-12 DIAGNOSIS — M961 Postlaminectomy syndrome, not elsewhere classified: Secondary | ICD-10-CM | POA: Diagnosis not present

## 2018-07-12 DIAGNOSIS — G8929 Other chronic pain: Secondary | ICD-10-CM | POA: Diagnosis not present

## 2018-07-12 MED ORDER — METHYLPREDNISOLONE ACETATE 80 MG/ML IJ SUSP
80.0000 mg | Freq: Once | INTRAMUSCULAR | Status: AC
  Administered 2018-07-12: 80 mg

## 2018-07-12 NOTE — Progress Notes (Signed)
.  Numeric Pain Rating Scale and Functional Assessment Average Pain 6   In the last MONTH (on 0-10 scale) has pain interfered with the following?  1. General activity like being  able to carry out your everyday physical activities such as walking, climbing stairs, carrying groceries, or moving a chair?  Rating(5)   +Driver, -BT, -Dye Allergies.  

## 2018-07-12 NOTE — Procedures (Signed)
Lumbar Epidural Steroid Injection - Interlaminar Approach with Fluoroscopic Guidance  Patient: Randall [name]      Date of Birth: 1946-05-21 MRN: [id number] PCP: Hulan Fess, MD      Visit Date: 07/12/2018   Universal Protocol:     Consent Given By: the patient  Position: PRONE  Additional Comments: Vital signs were monitored before and after the procedure. Patient was prepped and draped in the usual sterile fashion. The correct patient, procedure, and site was verified.   Injection Procedure Details:  Procedure Site One Meds Administered:  Meds ordered this encounter  Medications  . methylPREDNISolone acetate (DEPO-MEDROL) injection 80 mg     Laterality: Right  Location/Site:  L5-S1  Needle size: 20 G  Needle type: Tuohy  Needle Placement: Paramedian epidural  Findings:   -Comments: Excellent flow of contrast into the epidural space.  Procedure Details: Using a paramedian approach from the side mentioned above, the region overlying the inferior lamina was localized under fluoroscopic visualization and the soft tissues overlying this structure were infiltrated with 4 ml. of 1% Lidocaine without Epinephrine. The Tuohy needle was inserted into the epidural space using a paramedian approach.   The epidural space was localized using loss of resistance along with lateral and bi-planar fluoroscopic views.  After negative aspirate for air, blood, and CSF, a 2 ml. volume of Isovue-250 was injected into the epidural space and the flow of contrast was observed. Radiographs were obtained for documentation purposes.    The injectate was administered into the level noted above.   Additional Comments:  The patient tolerated the procedure well No complications occurred Dressing: Band-Aid    Post-procedure details: Patient was observed during the procedure. Post-procedure instructions were reviewed.  Patient left the clinic in stable condition.

## 2018-07-12 NOTE — Patient Instructions (Signed)

## 2018-07-13 NOTE — Progress Notes (Signed)
Randall [name] - 72 y.o. male MRN [id number]  Date of birth: [birth year]  Office Visit Note: Visit Date: 07/12/2018 PCP: Hulan Fess, MD Referred by: Hulan Fess, MD  Subjective: Chief Complaint  Patient presents with  . Lower Back - Pain  . Right Leg - Pain  . Left Leg - Pain   HPI: [name] is a 72 year old gentleman well-known to me.  He comes in today for planned L5-S1 interlaminar epidural steroid injection.  Please see our prior evaluation and management note for further details and justification.   ROS Otherwise per HPI.  Assessment & Plan: Visit Diagnoses:  1. Post laminectomy syndrome   2. Chronic bilateral low back pain without sciatica     Plan: No additional findings.   Meds & Orders:  Meds ordered this encounter  Medications  . methylPREDNISolone acetate (DEPO-MEDROL) injection 80 mg    Orders Placed This Encounter  Procedures  . XR C-ARM NO REPORT  . Epidural Steroid injection    Follow-up: Return if symptoms worsen or fail to improve.   Procedures: No procedures performed  Lumbar Epidural Steroid Injection - Interlaminar Approach with Fluoroscopic Guidance  Patient: Randall [name]      Date of Birth: September 04, 1946 MRN: [id number] PCP: Hulan Fess, MD      Visit Date: 07/12/2018   Universal Protocol:     Consent Given By: the patient  Position: PRONE  Additional Comments: Vital signs were monitored before and after the procedure. Patient was prepped and draped in the usual sterile fashion. The correct patient, procedure, and site was verified.   Injection Procedure Details:  Procedure Site One Meds Administered:  Meds ordered this encounter  Medications  . methylPREDNISolone acetate (DEPO-MEDROL) injection 80 mg     Laterality: Right  Location/Site:  L5-S1  Needle size: 20 G  Needle type: Tuohy  Needle Placement: Paramedian epidural  Findings:   -Comments: Excellent flow of contrast into the epidural space.  Procedure  Details: Using a paramedian approach from the side mentioned above, the region overlying the inferior lamina was localized under fluoroscopic visualization and the soft tissues overlying this structure were infiltrated with 4 ml. of 1% Lidocaine without Epinephrine. The Tuohy needle was inserted into the epidural space using a paramedian approach.   The epidural space was localized using loss of resistance along with lateral and bi-planar fluoroscopic views.  After negative aspirate for air, blood, and CSF, a 2 ml. volume of Isovue-250 was injected into the epidural space and the flow of contrast was observed. Radiographs were obtained for documentation purposes.    The injectate was administered into the level noted above.   Additional Comments:  The patient tolerated the procedure well No complications occurred Dressing: Band-Aid    Post-procedure details: Patient was observed during the procedure. Post-procedure instructions were reviewed.  Patient left the clinic in stable condition.   Clinical History: MRI LUMBAR SPINE WITHOUT CONTRAST  TECHNIQUE: Multiplanar, multisequence MR imaging of the lumbar spine was performed. No intravenous contrast was administered.  COMPARISON:  Lumbar MRI 01/18/2016 and earlier.  FINDINGS: Segmentation: Normal lumbar segmentation on a 2015 CT lumbar myelogram which is the same numbering system used on the 2017 MRI.  Alignment: Stable vertebral height and alignment. Relatively preserved lumbar lordosis.  Vertebrae: No marrow edema or evidence of acute osseous abnormality. Visualized bone marrow signal is within normal limits. Intact visible sacrum and SI joints.  Conus medullaris and cauda equina: Conus extends to the T12 level. No lower  spinal cord or conus signal abnormality.  Paraspinal and other soft tissues: Chronic polycystic kidney disease, more apparent on the left. Chronic ectasia of the infrarenal abdominal aorta, diameter  estimated at 32 millimeters now (31 millimeters in 2017).  Negative visualized posterior paraspinal soft tissues.  Disc levels:  T11-T12: Mild facet hypertrophy.  T12-L1:  Mild left facet hypertrophy.  L1-L2:  Mild facet hypertrophy.  L2-L3: Minimal circumferential disc bulge and endplate spurring. Mild to moderate facet hypertrophy. Chronic degenerative appearing right facet joint fluid. No spinal or lateral recess stenosis. Stable mild left L2 foraminal stenosis.  L3-L4: Chronic left eccentric disc bulge with broad-based left subarticular and foraminal component with annular fissure (series 7 image 27 and series 6, image 10). Superimposed moderate facet and ligament flavum hypertrophy with chronic facet joint fluid. Mild left lateral recess (left L4 nerve level), and moderate left L3 neural foraminal stenosis appears stable since 2017 without significant spinal stenosis. Mild contralateral right L3 foraminal stenosis is stable.  L4-L5: Chronic postoperative changes to the posterior elements of L4. Mild to moderate residual facet and ligament flavum hypertrophy is stable. Circumferential disc bulge with endplate spurring. Broad-based posterior and subarticular components. No significant spinal stenosis. Stable borderline to mild left lateral recess stenosis (left L5 nerve level). Stable mild to moderate bilateral L4 foraminal stenosis.  L5-S1:  Stable and negative aside from epidural lipomatosis.  IMPRESSION: 1. Continued stable MRI appearance of the lumbar spine. No acute osseous abnormality. 2. 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 3. Chronic postoperative changes to the L4 posterior elements with mild residual lateral recess and up to moderate bilateral foraminal stenosis. 4. Chronic mild infrarenal abdominal aortic aneurysm now estimated at 32 mm diameter Recommend followup by  Ultrasound in 3 years. This recommendation follows ACR consensus guidelines: White Paper of the ACR Incidental Findings Committee II on Vascular Findings. J Am Coll Radiol 2013; 10:789-794.   Electronically Signed   By: Genevie Ann M.D.   On: 06/21/2018 13:30  MRI of right hip IMPRESSION: 1. No stress fracture or AVN. 2. Bilateral peritendinosis, right greater than left. No associated trochanteric bursitis. 3. Moderate right-sided hamstring tendinopathy with surrounding inflammation/bursitis and reactive marrow edema in the ischium. This could be the source of the patient's pain. Recommend clinical correlation. Mild hamstring tendinopathy on the left. 4. No significant intrapelvic abnormalities.   Electronically Signed By: Marijo Sanes M.D. On: 01/14/2017 09:06   He reports that he has quit smoking. He has a 40.00 pack-year smoking history. He has never used smokeless tobacco.  Recent Labs    06/11/18 1058  LABURIC 6.1    Objective:  VS:  HT:    WT:   BMI:     BP:127/81  HR:79bpm  TEMP:97.9 F (36.6 C)(Oral)  RESP:  Physical Exam  Ortho Exam Imaging: Xr C-arm No Report  Result Date: 07/12/2018 Please see Notes tab for imaging impression.   Past Medical/Family/Surgical/Social History: Medications & Allergies reviewed per EMR, new medications updated. Patient Active Problem List   Diagnosis Date Noted  . Uncomplicated opioid use 85/11/7739  . Post laminectomy syndrome 01/17/2017  . Ischial bursitis of right side 01/15/2017  . Pain in right hip 01/15/2017  . Chronic bilateral low back pain without sciatica 01/15/2017  . HNP (herniated nucleus pulposus), lumbar 05/15/2014  . S/P cervical spinal fusion 12/19/2011    Class: Diagnosis of  . 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 Kidney stones    1 removed by litho. 1 by Cysto  . RLS (restless legs syndrome)    Family History    Problem Relation Age of Onset  . Anesthesia problems Neg Hx    Past Surgical History:  Procedure Laterality Date  . Cervial  May 2012   4-5, 6-7  . LUMBAR LAMINECTOMY N/A 05/15/2014   Procedure: MICRODISCECTOMY LUMBAR LAMINECTOMY;  Surgeon: Marybelle Killings, MD;  Location: [facility];  Service: Orthopedics;  Laterality: N/A;  L3-4, L4-5 Decompression, Left L3-4, L4-5 Microdiscectomy  . POSTERIOR CERVICAL FUSION/FORAMINOTOMY  12/19/2011   Procedure: POSTERIOR CERVICAL FUSION/FORAMINOTOMY LEVEL 2;  Surgeon: Marybelle Killings, MD;  Location: [facility];  Service: Orthopedics;  Laterality: N/A;  Posterior Cervical Fusion, C4-5, C6-7 Wiring, Vitoss, Iliac aspirate  . TONSILLECTOMY     Social History   Occupational History  . Not on file  Tobacco Use  . Smoking status: Former Smoker    Packs/day: 1.00    Years: 40.00    Pack years: 40.00  . Smokeless tobacco: Never Used  . Tobacco comment: Encouraged to remain smoke free.  Substance and Sexual Activity  . Alcohol use: Yes    Comment: occ  . Drug use: No  . Sexual activity: Not on file

## 2018-07-21 ENCOUNTER — Encounter (INDEPENDENT_AMBULATORY_CARE_PROVIDER_SITE_OTHER): Payer: Self-pay | Admitting: Physical Medicine and Rehabilitation

## 2018-07-21 NOTE — Progress Notes (Signed)
Randall [name] - 72 y.o. male MRN [id number]  Date of birth: May 09, 1946  Office Visit Note: Visit Date: 07/06/2018 PCP: Hulan Fess, MD Referred by: Hulan Fess, MD  Subjective: Chief Complaint  Patient presents with  . Lower Back - Pain  . Right Leg - Pain  . Left Leg - Pain   HPI: [name] is a 72 year old gentleman well-known to me at this point.  Prior patient of Dr. Rodell Perna status post lumbar surgery with discectomy laminectomy.  We started seeing him initially through Dr. Lorin Mercy and completed epidural injection with decent relief initially.  Went on to have facet joint blocks and radiofrequency ablation at least at one point was fairly beneficial.  Over the last year he has had just worsening increased back pain radiating to both legs.  He gets a lot of stiffness first thing in the morning and has difficulty moving.  He reports most of his pain with walking and standing.  It does work improved with just rest and medication.  We did have him on chronic low-dose tramadol which she is been very responsible in taking and we have been refilling that chronically.  The pain radiates into both legs posteriorly.  No real paresthesias just pain.  No focal weakness no trauma.  More recent flareup of pain has actually subsided and this may have been more of a tweak of his back with doing some activities.  He is actually over that part of it as we have been following him but he still has this chronic 8 out of 10 pain that is on average gets sharp and dull at the same time mainly localized across the lower spine and into the buttock and hip region.  We did obtain new MRI and he is here for review of that today and this was reviewed with him showing him spine models and the pictures.  He also underwent some lab work to try to look at possible rheumatologic issue.  Those labs were all normal.  MRI of the lumbar spine was not very revealing as there continues to be more left-sided features at L3-4 with  broad bulging and facet degenerative change bilaterally but no focal stenosis or nerve compression.  Right side does not really show much in the way of pathology.  Small annular tear on the left.  L4-5 as the prior operative level there are still degenerative changes here facet arthropathy some narrowing in the lateral recess.  L5-S1 still shows stable epidural lipomatosis.  There was a small aortic aneurysm seen with the recommendation for follow-up ultrasound in 3 years.  This note should go to his primary care physician I did give the patient a copy of his report and asked him to follow-up with his primary care physician.   Review of Systems  Constitutional: Negative for chills, fever, malaise/fatigue and weight loss.  HENT: Negative for hearing loss and sinus pain.   Eyes: Negative for blurred vision, double vision and photophobia.  Respiratory: Negative for cough and shortness of breath.   Cardiovascular: Negative for chest pain, palpitations and leg swelling.  Gastrointestinal: Negative for abdominal pain, nausea and vomiting.  Genitourinary: Negative for flank pain.  Musculoskeletal: Positive for back pain and joint pain. Negative for myalgias.  Skin: Negative for itching and rash.  Neurological: Negative for tremors, focal weakness and weakness.  Endo/Heme/Allergies: Negative.   Psychiatric/Behavioral: Negative for depression.  All other systems reviewed and are negative.  Otherwise per HPI.  Assessment & Plan: Visit  Diagnoses:  1. Chronic bilateral low back pain without sciatica   2. Post laminectomy syndrome   3. Uncomplicated opioid use     Plan: Findings:  Chronic recalcitrant low back pain some referral in the hip and legs bilaterally posteriorly and laterally.  MRI findings show general changes to be expected at his age of 60 but also shows post laminectomy at L4-5 continued left-sided disc bulging paracentral to the left with bilateral facet arthropathy and facet arthropathy  at L4-5.  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  Patient had normal rheumatologic lab values.  I think his stiffness and pain in the morning and with movement is really related to the arthritic and degenerative change noted in the spine.  He does have real tight musculature as well.  We have talked about foam rollers as well as massage.  He has had physical therapy.  He has not had dry needling.  In the past intermittent injections have helped.  Facet injections have helped radiofrequency ablation was not as good as we had hoped but it did help some.  I think the best approach at this point is to repeat general epidural injection.  We will get him back in for that.  We will maintain the low-dose tramadol I think he is using this appropriately.  He also takes on occasion methocarbamol or baclofen.  We have used both and he tends to think that the methocarbamol helps the most.  Alternatively I think he would do well with spinal cord stimulator trial but is just something he does not really want to think about at this point.  We talked about activity modification and exercises.    Meds & Orders: No orders of the defined types were placed in this encounter.  No orders of the defined types were placed in this encounter.   Follow-up: Return for Lumbar epidural injection.   Procedures: No procedures performed  No notes on file   Clinical History: MRI LUMBAR SPINE WITHOUT CONTRAST  TECHNIQUE: Multiplanar, multisequence MR imaging of the lumbar spine was performed. No intravenous contrast was administered.  COMPARISON:  Lumbar MRI 01/18/2016 and earlier.  FINDINGS: Segmentation: Normal lumbar segmentation on a 2015 CT lumbar myelogram which is the same numbering system used on the 2017 MRI.  Alignment: Stable vertebral height and alignment. Relatively preserved lumbar lordosis.  Vertebrae: No marrow edema or evidence of acute osseous abnormality. Visualized bone marrow signal is within normal  limits. Intact visible sacrum and SI joints.  Conus medullaris and cauda equina: Conus extends to the T12 level. No lower spinal cord or conus signal abnormality.  Paraspinal and other soft tissues: Chronic polycystic kidney disease, more apparent on the left. 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  Negative visualized posterior paraspinal soft tissues.  Disc levels:  T11-T12: Mild facet hypertrophy.  T12-L1:  Mild left facet hypertrophy.  L1-L2:  Mild facet hypertrophy.  L2-L3: Minimal circumferential disc bulge and endplate spurring. Mild to moderate facet hypertrophy. Chronic degenerative appearing right facet joint fluid. No spinal or lateral recess stenosis. Stable mild left L2 foraminal stenosis.  L3-L4: Chronic left eccentric disc bulge with broad-based left subarticular and foraminal component with annular fissure (series 7 image 27 and series 6, image 10). Superimposed moderate facet and ligament flavum hypertrophy with chronic facet joint fluid. Mild left lateral recess (left L4 nerve level), and moderate left L3 neural foraminal stenosis appears stable since 2017 without significant spinal stenosis. Mild contralateral  right L3 foraminal stenosis is stable.  L4-L5: Chronic postoperative changes to the posterior elements of L4. Mild to moderate residual facet and ligament flavum hypertrophy is stable. Circumferential disc bulge with endplate spurring. Broad-based posterior and subarticular components. No significant spinal stenosis. Stable borderline to mild left lateral recess stenosis (left L5 nerve level). Stable mild to moderate bilateral L4 foraminal stenosis.  L5-S1:  Stable and negative aside from epidural lipomatosis.  IMPRESSION: 1. Continued stable MRI appearance of the lumbar spine. No acute osseous abnormality. 2. Leftward disc degeneration at L3-L4 with up  to moderate left neural foraminal and lateral recess involvement. 3. Chronic postoperative changes to the L4 posterior elements with mild residual lateral recess and up to moderate bilateral foraminal stenosis. 4. Chronic mild infrarenal abdominal aortic aneurysm now estimated at 32 mm diameter Recommend followup by Ultrasound in 3 years. This recommendation follows ACR consensus guidelines: White Paper of the ACR Incidental Findings Committee II on Vascular Findings. J Am Coll Radiol 2013; 10:789-794.   Electronically Signed   By: Genevie Ann M.D.   On: 06/21/2018 13:30  MRI of right hip IMPRESSION: 1. No stress fracture or AVN. 2. Bilateral peritendinosis, right greater than left. No associated trochanteric bursitis. 3. Moderate right-sided hamstring tendinopathy with surrounding inflammation/bursitis and reactive marrow edema in the ischium. This could be the source of the patient's pain. Recommend clinical correlation. Mild hamstring tendinopathy on the left. 4. No significant intrapelvic abnormalities.   Electronically Signed By: Marijo Sanes M.D. On: 01/14/2017 09:06   He reports that he has quit smoking. He has a 40.00 pack-year smoking history. He has never used smokeless tobacco.  Recent Labs    06/11/18 1058  LABURIC 6.1    Objective:  VS:  HT:    WT:   BMI:     BP:(!) 142/81  HR:69bpm  TEMP: ( )  RESP:  Physical Exam  Constitutional: He is oriented to person, place, and time. He appears well-developed and well-nourished. No distress.  HENT:  Head: Normocephalic and atraumatic.  Nose: Nose normal.  Mouth/Throat: Oropharynx is clear and moist.  Eyes: Pupils are equal, round, and reactive to light. Conjunctivae are normal.  Neck: Normal range of motion. Neck supple. No tracheal deviation present.  Cardiovascular: Regular rhythm and intact distal pulses.  Pulmonary/Chest: Effort normal and breath sounds normal.  Abdominal: Soft. He exhibits no  distension. There is no rebound and no guarding.  Musculoskeletal: He exhibits no deformity.  Goes from sit to stand with some difficulty.  He has concordant pain with extension of the lumbar spine and facet joint loading.  He stands and walks with a forward flexed lumbar spine.  Very tight paraspinal musculature.  No pain with hip rotation.  Good distal strength without clonus.  No pain over the greater trochanters.  Neurological: He is alert and oriented to person, place, and time. He exhibits normal muscle tone. Coordination normal.  Skin: Skin is warm. No rash noted.  Psychiatric: He has a normal mood and affect. His behavior is normal.  Nursing note and vitals reviewed.   Ortho Exam Imaging: No results found.  Past Medical/Family/Surgical/Social History: Medications & Allergies reviewed per EMR, new medications updated. Patient Active Problem List   Diagnosis Date Noted  . Uncomplicated opioid use 32/35/5732  . Post laminectomy syndrome 01/17/2017  . Ischial bursitis of right side 01/15/2017  . Pain in right hip 01/15/2017  . Chronic bilateral low back pain without sciatica 01/15/2017  . HNP (herniated nucleus pulposus), lumbar  05/15/2014  . S/P cervical spinal fusion 12/19/2011    Class: Diagnosis of  . 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 Kidney stones    1 removed by litho. 1 by Cysto  . RLS (restless legs syndrome)    Family History  Problem Relation Age of Onset  . Anesthesia problems Neg Hx    Past Surgical History:  Procedure Laterality Date  . Cervial  May 2012   4-5, 6-7  . LUMBAR LAMINECTOMY N/A 05/15/2014   Procedure: MICRODISCECTOMY LUMBAR LAMINECTOMY;  Surgeon: Marybelle Killings, MD;  Location: [facility];  Service: Orthopedics;  Laterality: N/A;  L3-4, L4-5 Decompression, Left L3-4, L4-5 Microdiscectomy  . POSTERIOR CERVICAL FUSION/FORAMINOTOMY  12/19/2011   Procedure: POSTERIOR CERVICAL  FUSION/FORAMINOTOMY LEVEL 2;  Surgeon: Marybelle Killings, MD;  Location: [facility];  Service: Orthopedics;  Laterality: N/A;  Posterior Cervical Fusion, C4-5, C6-7 Wiring, Vitoss, Iliac aspirate  . TONSILLECTOMY     Social History   Occupational History  . Not on file  Tobacco Use  . Smoking status: Former Smoker    Packs/day: 1.00    Years: 40.00    Pack years: 40.00  . Smokeless tobacco: Never Used  . Tobacco comment: Encouraged to remain smoke free.  Substance and Sexual Activity  . Alcohol use: Yes    Comment: occ  . Drug use: No  . Sexual activity: Not on file

## 2018-07-30 ENCOUNTER — Telehealth (INDEPENDENT_AMBULATORY_CARE_PROVIDER_SITE_OTHER): Payer: Self-pay | Admitting: Physical Medicine and Rehabilitation

## 2018-08-24 DIAGNOSIS — R350 Frequency of micturition: Secondary | ICD-10-CM | POA: Diagnosis not present

## 2018-08-24 DIAGNOSIS — N401 Enlarged prostate with lower urinary tract symptoms: Secondary | ICD-10-CM | POA: Diagnosis not present

## 2018-09-13 NOTE — Telephone Encounter (Signed)
Closing encounter

## 2018-09-27 ENCOUNTER — Other Ambulatory Visit (INDEPENDENT_AMBULATORY_CARE_PROVIDER_SITE_OTHER): Payer: Self-pay | Admitting: Physical Medicine and Rehabilitation

## 2018-09-27 ENCOUNTER — Other Ambulatory Visit (INDEPENDENT_AMBULATORY_CARE_PROVIDER_SITE_OTHER): Payer: Self-pay | Admitting: Orthopedic Surgery

## 2018-09-27 ENCOUNTER — Telehealth (INDEPENDENT_AMBULATORY_CARE_PROVIDER_SITE_OTHER): Payer: Self-pay | Admitting: *Deleted

## 2018-09-27 DIAGNOSIS — M25551 Pain in right hip: Secondary | ICD-10-CM

## 2018-09-27 MED ORDER — TRAMADOL HCL 50 MG PO TABS: ORAL_TABLET | ORAL | 0 refills | Status: DC

## 2018-09-27 NOTE — Telephone Encounter (Signed)
This is a FN pt.

## 2018-09-27 NOTE — Progress Notes (Signed)
Refilled Tramadol.Patient's 12 month Coker history was reviewed and no inappropriate medication refills noted.

## 2018-09-27 NOTE — Telephone Encounter (Signed)
Done

## 2018-09-29 DIAGNOSIS — I779 Disorder of arteries and arterioles, unspecified: Secondary | ICD-10-CM | POA: Diagnosis not present

## 2018-09-29 DIAGNOSIS — F17200 Nicotine dependence, unspecified, uncomplicated: Secondary | ICD-10-CM | POA: Diagnosis not present

## 2018-09-29 DIAGNOSIS — J449 Chronic obstructive pulmonary disease, unspecified: Secondary | ICD-10-CM | POA: Diagnosis not present

## 2018-09-29 DIAGNOSIS — I10 Essential (primary) hypertension: Secondary | ICD-10-CM | POA: Diagnosis not present

## 2018-09-29 DIAGNOSIS — E785 Hyperlipidemia, unspecified: Secondary | ICD-10-CM | POA: Diagnosis not present

## 2018-09-29 DIAGNOSIS — Z86010 Personal history of colonic polyps: Secondary | ICD-10-CM | POA: Diagnosis not present

## 2018-09-29 DIAGNOSIS — M545 Low back pain: Secondary | ICD-10-CM | POA: Diagnosis not present

## 2018-09-29 DIAGNOSIS — Z0000 Encounter for general adult medical examination without abnormal findings: Secondary | ICD-10-CM | POA: Diagnosis not present

## 2018-09-29 DIAGNOSIS — N401 Enlarged prostate with lower urinary tract symptoms: Secondary | ICD-10-CM | POA: Diagnosis not present

## 2018-09-29 DIAGNOSIS — I714 Abdominal aortic aneurysm, without rupture: Secondary | ICD-10-CM | POA: Diagnosis not present

## 2018-09-29 DIAGNOSIS — Z125 Encounter for screening for malignant neoplasm of prostate: Secondary | ICD-10-CM | POA: Diagnosis not present

## 2018-09-29 DIAGNOSIS — G2581 Restless legs syndrome: Secondary | ICD-10-CM | POA: Diagnosis not present

## 2018-10-01 ENCOUNTER — Other Ambulatory Visit: Payer: Self-pay | Admitting: Family Medicine

## 2018-10-01 DIAGNOSIS — I779 Disorder of arteries and arterioles, unspecified: Secondary | ICD-10-CM

## 2018-10-11 ENCOUNTER — Other Ambulatory Visit: Payer: Medicare Other

## 2018-10-11 ENCOUNTER — Ambulatory Visit
Admission: RE | Admit: 2018-10-11 | Discharge: 2018-10-11 | Disposition: A | Discharge status: Home / Self Care | Payer: Medicare Other | Source: Ambulatory Visit | Attending: Family Medicine | Admitting: Family Medicine

## 2018-10-11 DIAGNOSIS — I6523 Occlusion and stenosis of bilateral carotid arteries: Secondary | ICD-10-CM | POA: Diagnosis not present

## 2018-10-11 DIAGNOSIS — I779 Disorder of arteries and arterioles, unspecified: Secondary | ICD-10-CM

## 2018-10-22 NOTE — Telephone Encounter (Signed)
Rx request 

## 2018-10-25 ENCOUNTER — Other Ambulatory Visit (INDEPENDENT_AMBULATORY_CARE_PROVIDER_SITE_OTHER): Payer: Self-pay | Admitting: Physical Medicine and Rehabilitation

## 2018-10-25 DIAGNOSIS — M25551 Pain in right hip: Secondary | ICD-10-CM

## 2018-10-25 MED ORDER — TRAMADOL HCL 50 MG PO TABS: ORAL_TABLET | ORAL | 0 refills | Status: DC

## 2018-10-25 NOTE — Progress Notes (Signed)
Patient's 12 month [place] CSRS database history was reviewed and no inappropriate medication refills noted.   

## 2018-10-29 DIAGNOSIS — R748 Abnormal levels of other serum enzymes: Secondary | ICD-10-CM | POA: Diagnosis not present

## 2018-11-03 DIAGNOSIS — R945 Abnormal results of liver function studies: Secondary | ICD-10-CM | POA: Diagnosis not present

## 2018-11-24 ENCOUNTER — Other Ambulatory Visit (INDEPENDENT_AMBULATORY_CARE_PROVIDER_SITE_OTHER): Payer: Self-pay | Admitting: Physical Medicine and Rehabilitation

## 2018-11-24 DIAGNOSIS — M25551 Pain in right hip: Secondary | ICD-10-CM

## 2018-11-24 NOTE — Telephone Encounter (Signed)
Please advise 

## 2018-11-25 ENCOUNTER — Ambulatory Visit
Admission: RE | Admit: 2018-11-25 | Discharge: 2018-11-25 | Disposition: A | Discharge status: Home / Self Care | Payer: Medicare Other | Source: Ambulatory Visit | Attending: Acute Care | Admitting: Acute Care

## 2018-11-25 DIAGNOSIS — Z122 Encounter for screening for malignant neoplasm of respiratory organs: Secondary | ICD-10-CM

## 2018-11-25 DIAGNOSIS — Z87891 Personal history of nicotine dependence: Secondary | ICD-10-CM | POA: Diagnosis not present

## 2018-11-26 ENCOUNTER — Other Ambulatory Visit: Payer: Self-pay | Admitting: Acute Care

## 2018-11-26 DIAGNOSIS — Z122 Encounter for screening for malignant neoplasm of respiratory organs: Secondary | ICD-10-CM

## 2018-11-26 DIAGNOSIS — Z87891 Personal history of nicotine dependence: Secondary | ICD-10-CM

## 2018-12-20 DIAGNOSIS — B182 Chronic viral hepatitis C: Secondary | ICD-10-CM | POA: Diagnosis not present

## 2018-12-21 ENCOUNTER — Other Ambulatory Visit: Payer: Self-pay | Admitting: Nurse Practitioner

## 2018-12-21 DIAGNOSIS — B182 Chronic viral hepatitis C: Secondary | ICD-10-CM

## 2018-12-24 ENCOUNTER — Other Ambulatory Visit (INDEPENDENT_AMBULATORY_CARE_PROVIDER_SITE_OTHER): Payer: Self-pay | Admitting: Physical Medicine and Rehabilitation

## 2018-12-24 DIAGNOSIS — M25551 Pain in right hip: Secondary | ICD-10-CM

## 2018-12-27 NOTE — Telephone Encounter (Signed)
Please advise 

## 2018-12-30 ENCOUNTER — Other Ambulatory Visit: Payer: Self-pay | Admitting: Family Medicine

## 2018-12-30 DIAGNOSIS — I714 Abdominal aortic aneurysm, without rupture, unspecified: Secondary | ICD-10-CM

## 2019-01-04 ENCOUNTER — Ambulatory Visit
Admission: RE | Admit: 2019-01-04 | Discharge: 2019-01-04 | Disposition: A | Discharge status: Home / Self Care | Payer: Medicare Other | Source: Ambulatory Visit | Attending: Nurse Practitioner | Admitting: Nurse Practitioner

## 2019-01-04 DIAGNOSIS — K8020 Calculus of gallbladder without cholecystitis without obstruction: Secondary | ICD-10-CM | POA: Diagnosis not present

## 2019-01-04 DIAGNOSIS — B182 Chronic viral hepatitis C: Secondary | ICD-10-CM

## 2019-01-17 ENCOUNTER — Telehealth (INDEPENDENT_AMBULATORY_CARE_PROVIDER_SITE_OTHER): Payer: Self-pay | Admitting: Physical Medicine and Rehabilitation

## 2019-01-17 NOTE — Telephone Encounter (Signed)
After reviewing the patient's chart and complaint, this appears to be an appointment that could be handled using telemedicine either WebEx or telephone.  However, the patient has done well in the past with prior injection and this could be scheduled for when we are starting to do injections again.  This looks like it will be closer to end of April.

## 2019-01-18 NOTE — Telephone Encounter (Signed)
Left message #1

## 2019-01-23 ENCOUNTER — Other Ambulatory Visit (INDEPENDENT_AMBULATORY_CARE_PROVIDER_SITE_OTHER): Payer: Self-pay | Admitting: Physical Medicine and Rehabilitation

## 2019-01-23 DIAGNOSIS — M25551 Pain in right hip: Secondary | ICD-10-CM

## 2019-01-23 NOTE — Telephone Encounter (Signed)
Rx request 

## 2019-02-01 NOTE — Telephone Encounter (Signed)
Scheduled for telephone visit on 4/13 at 1330.

## 2019-02-07 ENCOUNTER — Ambulatory Visit (INDEPENDENT_AMBULATORY_CARE_PROVIDER_SITE_OTHER): Payer: Medicare Other | Admitting: Physical Medicine and Rehabilitation

## 2019-02-07 ENCOUNTER — Encounter (INDEPENDENT_AMBULATORY_CARE_PROVIDER_SITE_OTHER): Payer: Self-pay | Admitting: Physical Medicine and Rehabilitation

## 2019-02-07 ENCOUNTER — Other Ambulatory Visit: Payer: Self-pay

## 2019-02-07 DIAGNOSIS — M7061 Trochanteric bursitis, right hip: Secondary | ICD-10-CM | POA: Diagnosis not present

## 2019-02-07 DIAGNOSIS — K740 Hepatic fibrosis: Secondary | ICD-10-CM | POA: Diagnosis not present

## 2019-02-07 DIAGNOSIS — M5441 Lumbago with sciatica, right side: Secondary | ICD-10-CM

## 2019-02-07 DIAGNOSIS — M961 Postlaminectomy syndrome, not elsewhere classified: Secondary | ICD-10-CM | POA: Diagnosis not present

## 2019-02-07 DIAGNOSIS — G8929 Other chronic pain: Secondary | ICD-10-CM

## 2019-02-07 DIAGNOSIS — B182 Chronic viral hepatitis C: Secondary | ICD-10-CM | POA: Diagnosis not present

## 2019-02-07 MED ORDER — PREDNISONE 50 MG PO TABS: ORAL_TABLET | ORAL | 0 refills | Status: DC

## 2019-02-07 MED ORDER — BACLOFEN 10 MG PO TABS: 10.0000 mg | ORAL_TABLET | Freq: Three times a day (TID) | ORAL | 0 refills | Status: DC | PRN

## 2019-02-07 NOTE — Progress Notes (Signed)
Randall [name] - 73 y.o. male MRN [id number]  Date of birth: 12-Feb-1946  Virtual Visit via Telephone Note:  I connected with Randall [name] 02/07/2019 at  1:30 PM EDT by telephone and verified that I am speaking with the correct person using two identifiers.   I discussed the limitations, risks, security and privacy concerns of performing an evaluation and management service by telephone and the availability of in person appointments. I also discussed with the patient that there may be a patient responsible charge related to this service. The patient expressed understanding and agreed to proceed.  Visit Date: 02/07/2019 PCP: Hulan Fess, MD Referred by: Hulan Fess, MD  Subjective: Chief Complaint  Patient presents with   Lower Back - Pain   Right Hip - Pain   HPI: Randall [name] is a 73 y.o. male. Randall [name] is a well-known patient to my clinic.  His brief history is that he was followed by Dr. Rodell Perna and underwent lumbar laminectomy and microdiscectomy at L4-5 some years ago.  He did get some relief of radicular complaints but went on to have chronic back pain in general.  Usually he has chronic axial midline low back pain worse with going to sit to stand and with walking better at rest.  I have maintained him on chronic tramadol which she only takes about 2/day and has been doing pretty well with that and we do monitor him regularly.  He has not had any aberrant usage.  He calls in today with pain again in the middle of the low back but with extreme pain in the right buttock area.  He has had this pain complaint off and on intermittently for a few years now.  Epidural injection has helped the most.  We have updated MRI which is reviewed below and not shown any real specific findings.  He has continued facet arthropathy and some lateral recess narrowing more left than right however and some foraminal narrowing at L4 but no focal nerve compression.  He reports no new injury or  anything specific just woke up over the last few weeks with progressive pain in the right buttock area.  He reports that it is worse first thing in the morning when he stands up and after that he does do okay.  It does not go beyond the buttock or down the leg.  He actually had a situation where he had initial bursitis at one point the same this is not really pain with sitting on that spot like it was then.  He rates his pain as a 9 out of 10 without focal weakness or paresthesia or any other concerning factors.  The tramadol does seem to help to a degree.  He has not had epidural injection in quite some time.  We have discussed in the past with him spinal cord stimulator trial which I think he would be a great candidate for that.  Review of Systems  Constitutional: Negative for chills, fever, malaise/fatigue and weight loss.  HENT: Negative for hearing loss and sinus pain.   Eyes: Negative for blurred vision, double vision and photophobia.  Respiratory: Negative for cough and shortness of breath.   Cardiovascular: Negative for chest pain, palpitations and leg swelling.  Gastrointestinal: Negative for abdominal pain, nausea and vomiting.  Genitourinary: Negative for flank pain.  Musculoskeletal: Positive for back pain and joint pain. Negative for myalgias.  Skin: Negative for itching and rash.  Neurological: Negative for tremors, focal weakness and  weakness.  Endo/Heme/Allergies: Negative.   Psychiatric/Behavioral: Negative for depression.  All other systems reviewed and are negative.  Otherwise per HPI.  Objective/Observation:  Objectively the patient does not indicate that he has had any weakness of the feet or changes in his gait and he has had no objective physical finding except having a hard time standing in the morning going to extension.  Assessment & Plan: Visit Diagnoses:  1. Chronic right-sided low back pain with right-sided sciatica   2. Post laminectomy syndrome   3. Greater  trochanteric bursitis, right     Plan: Findings:  Chronic pain syndrome and chronic low back pain in the setting of prior microdiscectomy and degenerative facet arthropathy and degenerative disc changes without stenosis.  He goes from time to time and basically will get a flareup of what I think is probably an annular tear and discogenic type pain.  He has not done well in the past with radiofrequency ablation and facet blocks although they were very temporarily helpful it did not seem to give him much relief of his main complaints.  This does not seem to be a bursitis of the ischial bursa.  I think this again is more disc related.  We can provide a short course of prednisone along with baclofen as a muscle relaxer to see if that gets him over the hump and continue to be active and we talked about activity modification and exercises again.  We could look at in the future epidural injection.    Meds & Orders:  Meds ordered this encounter  Medications   predniSONE (DELTASONE) 50 MG tablet    Sig: Take 1 tablet daily with food for 5 days until finished    Dispense:  5 tablet    Refill:  0   baclofen (LIORESAL) 10 MG tablet    Sig: Take 1 tablet (10 mg total) by mouth every 8 (eight) hours as needed for muscle spasms (Pain).    Dispense:  60 tablet    Refill:  0   No orders of the defined types were placed in this encounter.   Follow-up: Return if symptoms worsen or fail to improve.  Follow Up Instructions:    I discussed the assessment and treatment plan with the patient. The patient was provided an opportunity to ask questions and all were answered. The patient agreed with the plan and demonstrated an understanding of the instructions.   The patient was advised to call back or seek an in-person evaluation if the symptoms worsen or if the condition fails to improve as anticipated.  I provided 25 minutes of non-face-to-face time during this encounter.   Laurence Spates, MD    Clinical  History: MRI LUMBAR SPINE WITHOUT CONTRAST  TECHNIQUE: Multiplanar, multisequence MR imaging of the lumbar spine was performed. No intravenous contrast was administered.  COMPARISON: Lumbar MRI 01/18/2016 and earlier.  FINDINGS: Segmentation: Normal lumbar segmentation on a 2015 CT lumbar myelogram which is the same numbering system used on the 2017 MRI.  Alignment: Stable vertebral height and alignment. Relatively preserved lumbar lordosis.  Vertebrae: No marrow edema or evidence of acute osseous abnormality. Visualized bone marrow signal is within normal limits. Intact visible sacrum and SI joints.  Conus medullaris and cauda equina: Conus extends to the T12 level. No lower spinal cord or conus signal abnormality.  Paraspinal and other soft tissues: Chronic polycystic kidney disease, more apparent on the left. Chronic ectasia of the infrarenal abdominal aorta, diameter estimated at 32 millimeters now (31  millimeters in 2017).  Negative visualized posterior paraspinal soft tissues.  Disc levels:  T11-T12: Mild facet hypertrophy.  T12-L1: Mild left facet hypertrophy.  L1-L2: Mild facet hypertrophy.  L2-L3: Minimal circumferential disc bulge and endplate spurring. Mild to moderate facet hypertrophy. Chronic degenerative appearing right facet joint fluid. No spinal or lateral recess stenosis. Stable mild left L2 foraminal stenosis.  L3-L4: Chronic left eccentric disc bulge with broad-based left subarticular and foraminal component with annular fissure (series 7 image 27 and series 6, image 10). Superimposed moderate facet and ligament flavum hypertrophy with chronic facet joint fluid. Mild left lateral recess (left L4 nerve level), and moderate left L3 neural foraminal stenosis appears stable since 2017 without significant spinal stenosis. Mild contralateral right L3 foraminal stenosis is stable.  L4-L5: Chronic postoperative changes to the posterior  elements of L4. Mild to moderate residual facet and ligament flavum hypertrophy is stable. Circumferential disc bulge with endplate spurring. Broad-based posterior and subarticular components. No significant spinal stenosis. Stable borderline to mild left lateral recess stenosis (left L5 nerve level). Stable mild to moderate bilateral L4 foraminal stenosis.  L5-S1: Stable and negative aside from epidural lipomatosis.  IMPRESSION: 1. Continued stable MRI appearance of the lumbar spine. No acute osseous abnormality. 2. 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 3. Chronic postoperative changes to the L4 posterior elements with mild residual lateral recess and up to moderate bilateral foraminal stenosis. 4. Chronic mild infrarenal abdominal aortic aneurysm now estimated at 32 mm diameter Recommend followup by Ultrasound in 3 years. This recommendation follows ACR consensus guidelines: White Paper of the ACR Incidental Findings Committee II on Vascular Findings. J Am Coll Radiol 2013; 10:789-794.   Electronically Signed By: Genevie Ann M.D. On: 06/21/2018 13:30 ------------------------------------ MRI of right hip IMPRESSION: 1. No stress fracture or AVN. 2. Bilateral peritendinosis, right greater than left. No associated trochanteric bursitis. 3. Moderate right-sided hamstring tendinopathy with surrounding inflammation/bursitis and reactive marrow edema in the ischium. This could be the source of the patient's pain. Recommend clinical correlation. Mild hamstring tendinopathy on the left. 4. No significant intrapelvic abnormalities.   Electronically Signed By: Marijo Sanes M.D. On: 01/14/2017 09:06   He reports that he has quit smoking. He has a 40.00 pack-year smoking history. He has never used smokeless tobacco.  Recent Labs    06/11/18 1058  LABURIC 6.1    Past Medical/Family/Surgical/Social  History: Medications & Allergies reviewed per EMR, new medications updated. 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 1 by Cysto   RLS (restless legs syndrome)    Family History  Problem Relation Age of Onset   Anesthesia problems Neg Hx    Past Surgical History:  Procedure Laterality Date   Cervial  May 2012   4-5, 6-7   LUMBAR LAMINECTOMY N/A 05/15/2014   Procedure: MICRODISCECTOMY LUMBAR LAMINECTOMY;  Surgeon: Marybelle Killings, MD;  Location: [place];  Service: Orthopedics;  Laterality: N/A;  L3-4, L4-5 Decompression, Left L3-4, L4-5 Microdiscectomy   POSTERIOR CERVICAL FUSION/FORAMINOTOMY  12/19/2011   Procedure: POSTERIOR CERVICAL FUSION/FORAMINOTOMY LEVEL 2;  Surgeon: Marybelle Killings, MD;  Location: [place];  Service: Orthopedics;  Laterality: N/A;  Posterior Cervical Fusion, C4-5, C6-7 Wiring, Vitoss, Iliac aspirate   TONSILLECTOMY     Social History   Occupational History   Not on file  Tobacco Use   Smoking status: Former Smoker    Packs/day: 1.00    Years: 40.00    Pack years: 40.00   Smokeless tobacco: Never Used   Tobacco comment: Encouraged to remain smoke free.  Substance and Sexual Activity   Alcohol use: Yes    Comment: occ   Drug use: No   Sexual activity: Not on file

## 2019-02-07 NOTE — Progress Notes (Signed)
  Numeric Pain Rating Scale and Functional Assessment Average Pain 9   In the last MONTH (on 0-10 scale) has pain interfered with the following?  1. General activity like being  able to carry out your everyday physical activities such as walking, climbing stairs, carrying groceries, or moving a chair?  Rating(5)

## 2019-02-21 ENCOUNTER — Other Ambulatory Visit (INDEPENDENT_AMBULATORY_CARE_PROVIDER_SITE_OTHER): Payer: Self-pay | Admitting: Physical Medicine and Rehabilitation

## 2019-02-21 DIAGNOSIS — M25551 Pain in right hip: Secondary | ICD-10-CM

## 2019-02-21 NOTE — Telephone Encounter (Signed)
Please advise 

## 2019-03-21 ENCOUNTER — Other Ambulatory Visit (INDEPENDENT_AMBULATORY_CARE_PROVIDER_SITE_OTHER): Payer: Self-pay | Admitting: Physical Medicine and Rehabilitation

## 2019-03-21 DIAGNOSIS — M25551 Pain in right hip: Secondary | ICD-10-CM

## 2019-03-22 ENCOUNTER — Encounter: Payer: Self-pay | Admitting: Physical Medicine and Rehabilitation

## 2019-03-22 ENCOUNTER — Ambulatory Visit (INDEPENDENT_AMBULATORY_CARE_PROVIDER_SITE_OTHER): Payer: Medicare Other | Admitting: Physical Medicine and Rehabilitation

## 2019-03-22 ENCOUNTER — Ambulatory Visit: Payer: Medicare Other

## 2019-03-22 ENCOUNTER — Other Ambulatory Visit: Payer: Self-pay

## 2019-03-22 VITALS — BP 155/92 | HR 75

## 2019-03-22 DIAGNOSIS — M5416 Radiculopathy, lumbar region: Secondary | ICD-10-CM | POA: Diagnosis not present

## 2019-03-22 MED ORDER — METHYLPREDNISOLONE ACETATE 80 MG/ML IJ SUSP
80.0000 mg | Freq: Once | INTRAMUSCULAR | Status: AC
  Administered 2019-03-22: 80 mg

## 2019-03-22 NOTE — Telephone Encounter (Signed)
Please advise 

## 2019-03-22 NOTE — Progress Notes (Signed)
Numeric Pain Rating Scale and Functional Assessment Average Pain 10   In the last MONTH (on 0-10 scale) has pain interfered with the following?  1. General activity like being  able to carry out your everyday physical activities such as walking, climbing stairs, carrying groceries, or moving a chair?  Rating(10)   +Driver, -BT, -Dye Allergies. 

## 2019-03-23 NOTE — Procedures (Signed)
Lumbar Epidural Steroid Injection - Interlaminar Approach with Fluoroscopic Guidance  Patient: Randall [name]      Date of Birth: [birth year] MRN: [id number] PCP: Hulan Fess, MD      Visit Date: 03/22/2019   Universal Protocol:     Consent Given By: the patient  Position: PRONE  Additional Comments: Vital signs were monitored before and after the procedure. Patient was prepped and draped in the usual sterile fashion. The correct patient, procedure, and site was verified.   Injection Procedure Details:  Procedure Site One Meds Administered:  Meds ordered this encounter  Medications  . methylPREDNISolone acetate (DEPO-MEDROL) injection 80 mg     Laterality: Right  Location/Site:  L5-S1  Needle size: 20 G  Needle type: Tuohy  Needle Placement: Paramedian epidural  Findings:   -Comments: Excellent flow of contrast into the epidural space.  Procedure Details: Using a paramedian approach from the side mentioned above, the region overlying the inferior lamina was localized under fluoroscopic visualization and the soft tissues overlying this structure were infiltrated with 4 ml. of 1% Lidocaine without Epinephrine. The Tuohy needle was inserted into the epidural space using a paramedian approach.   The epidural space was localized using loss of resistance along with lateral and bi-planar fluoroscopic views.  After negative aspirate for air, blood, and CSF, a 2 ml. volume of Isovue-250 was injected into the epidural space and the flow of contrast was observed. Radiographs were obtained for documentation purposes.    The injectate was administered into the level noted above.   Additional Comments:  The patient tolerated the procedure well Dressing: 2 x 2 sterile gauze and Band-Aid    Post-procedure details: Patient was observed during the procedure. Post-procedure instructions were reviewed.  Patient left the clinic in stable condition.

## 2019-03-23 NOTE — Progress Notes (Signed)
Randall [name] - 73 y.o. male MRN [id number]  Date of birth: 12-13-1945  Office Visit Note: Visit Date: 03/22/2019 PCP: Hulan Fess, MD Referred by: Hulan Fess, MD  Subjective: Chief Complaint  Patient presents with  . Lower Back - Pain  . Right Lower Leg - Pain  . Right Leg - Pain   HPI:  Randall [name] is a 73 y.o. male who comes in today For planned right L5-S1 interlaminar epidural steroid injection.  Patient's been having worsening right hip and leg pain that seems radicular in more of an L5 distribution.  He has had prior lumbar discectomy and laminectomy in his history as well documented in our prior notes.  Just as a ongoing issue I think he would probably do well with spinal cord stimulator trial but is really been reluctant to look at that.  He continues with tramadol for pain relief.  We will see how he does with the injection diagnostically.  MRI had been updated fairly recently without focal nerve compression.  ROS Otherwise per HPI.  Assessment & Plan: Visit Diagnoses:  1. Lumbar radiculopathy     Plan: No additional findings.   Meds & Orders:  Meds ordered this encounter  Medications  . methylPREDNISolone acetate (DEPO-MEDROL) injection 80 mg    Orders Placed This Encounter  Procedures  . XR C-ARM NO REPORT  . Epidural Steroid injection    Follow-up: Return if symptoms worsen or fail to improve.   Procedures: No procedures performed  Lumbar Epidural Steroid Injection - Interlaminar Approach with Fluoroscopic Guidance  Patient: Randall [name]      Date of Birth: 1946-04-28 MRN: [id number] PCP: Hulan Fess, MD      Visit Date: 03/22/2019   Universal Protocol:     Consent Given By: the patient  Position: PRONE  Additional Comments: Vital signs were monitored before and after the procedure. Patient was prepped and draped in the usual sterile fashion. The correct patient, procedure, and site was verified.   Injection Procedure Details:   Procedure Site One Meds Administered:  Meds ordered this encounter  Medications  . methylPREDNISolone acetate (DEPO-MEDROL) injection 80 mg     Laterality: Right  Location/Site:  L5-S1  Needle size: 20 G  Needle type: Tuohy  Needle Placement: Paramedian epidural  Findings:   -Comments: Excellent flow of contrast into the epidural space.  Procedure Details: Using a paramedian approach from the side mentioned above, the region overlying the inferior lamina was localized under fluoroscopic visualization and the soft tissues overlying this structure were infiltrated with 4 ml. of 1% Lidocaine without Epinephrine. The Tuohy needle was inserted into the epidural space using a paramedian approach.   The epidural space was localized using loss of resistance along with lateral and bi-planar fluoroscopic views.  After negative aspirate for air, blood, and CSF, a 2 ml. volume of Isovue-250 was injected into the epidural space and the flow of contrast was observed. Radiographs were obtained for documentation purposes.    The injectate was administered into the level noted above.   Additional Comments:  The patient tolerated the procedure well Dressing: 2 x 2 sterile gauze and Band-Aid    Post-procedure details: Patient was observed during the procedure. Post-procedure instructions were reviewed.  Patient left the clinic in stable condition.    Clinical History: MRI LUMBAR SPINE WITHOUT CONTRAST  TECHNIQUE: Multiplanar, multisequence MR imaging of the lumbar spine was performed. No intravenous contrast was administered.  COMPARISON: Lumbar MRI 01/18/2016 and earlier.  FINDINGS: Segmentation: Normal lumbar segmentation on a 2015 CT lumbar myelogram which is the same numbering system used on the 2017 MRI.  Alignment: Stable vertebral height and alignment. Relatively preserved lumbar lordosis.  Vertebrae: No marrow edema or evidence of acute osseous abnormality.  Visualized bone marrow signal is within normal limits. Intact visible sacrum and SI joints.  Conus medullaris and cauda equina: Conus extends to the T12 level. No lower spinal cord or conus signal abnormality.  Paraspinal and other soft tissues: Chronic polycystic kidney disease, more apparent on the left. 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  Negative visualized posterior paraspinal soft tissues.  Disc levels:  T11-T12: Mild facet hypertrophy.  T12-L1: Mild left facet hypertrophy.  L1-L2: Mild facet hypertrophy.  L2-L3: Minimal circumferential disc bulge and endplate spurring. Mild to moderate facet hypertrophy. Chronic degenerative appearing right facet joint fluid. No spinal or lateral recess stenosis. Stable mild left L2 foraminal stenosis.  L3-L4: Chronic left eccentric disc bulge with broad-based left subarticular and foraminal component with annular fissure (series 7 image 27 and series 6, image 10). Superimposed moderate facet and ligament flavum hypertrophy with chronic facet joint fluid. Mild left lateral recess (left L4 nerve level), and moderate left L3 neural foraminal stenosis appears stable since 2017 without significant spinal stenosis. Mild contralateral right L3 foraminal stenosis is stable.  L4-L5: Chronic postoperative changes to the posterior elements of L4. Mild to moderate residual facet and ligament flavum hypertrophy is stable. Circumferential disc bulge with endplate spurring. Broad-based posterior and subarticular components. No significant spinal stenosis. Stable borderline to mild left lateral recess stenosis (left L5 nerve level). Stable mild to moderate bilateral L4 foraminal stenosis.  L5-S1: Stable and negative aside from epidural lipomatosis.  IMPRESSION: 1. Continued stable MRI appearance of the lumbar spine. No acute osseous abnormality. 2.  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 3. Chronic postoperative changes to the L4 posterior elements with mild residual lateral recess and up to moderate bilateral foraminal stenosis. 4. Chronic mild infrarenal abdominal aortic aneurysm now estimated at 32 mm diameter Recommend followup by Ultrasound in 3 years. This recommendation follows ACR consensus guidelines: White Paper of the ACR Incidental Findings Committee II on Vascular Findings. J Am Coll Radiol 2013; 10:789-794.   Electronically Signed By: Genevie Ann M.D. On: 06/21/2018 13:30 ------------------------------------ MRI of right hip IMPRESSION: 1. No stress fracture or AVN. 2. Bilateral peritendinosis, right greater than left. No associated trochanteric bursitis. 3. Moderate right-sided hamstring tendinopathy with surrounding inflammation/bursitis and reactive marrow edema in the ischium. This could be the source of the patient's pain. Recommend clinical correlation. Mild hamstring tendinopathy on the left. 4. No significant intrapelvic abnormalities.   Electronically Signed By: Marijo Sanes M.D. On: 01/14/2017 09:06     Objective:  VS:  HT:    WT:   BMI:     BP:(!) 155/92  HR:75bpm  TEMP: ( )  RESP:96 % Physical Exam  Ortho Exam Imaging: Xr C-arm No Report  Result Date: 03/22/2019 Please see Notes tab for imaging impression.

## 2019-03-26 ENCOUNTER — Other Ambulatory Visit: Payer: Self-pay

## 2019-03-26 ENCOUNTER — Telehealth: Payer: Self-pay | Admitting: Cardiology

## 2019-03-26 DIAGNOSIS — Z20822 Contact with and (suspected) exposure to covid-19: Secondary | ICD-10-CM

## 2019-03-26 NOTE — Telephone Encounter (Signed)
Spoke with patient regarding recent office appt 5/26 and possible Covid exposure. Covid test ordered appt 5/31 Sunday 12:30

## 2019-03-27 ENCOUNTER — Other Ambulatory Visit: Payer: Medicare Other

## 2019-03-28 LAB — NOVEL CORONAVIRUS, NAA: SARS-CoV-2, NAA: NOT DETECTED

## 2019-03-30 DIAGNOSIS — B182 Chronic viral hepatitis C: Secondary | ICD-10-CM | POA: Diagnosis not present

## 2019-04-19 ENCOUNTER — Telehealth: Payer: Self-pay | Admitting: Physical Medicine and Rehabilitation

## 2019-04-19 ENCOUNTER — Other Ambulatory Visit (INDEPENDENT_AMBULATORY_CARE_PROVIDER_SITE_OTHER): Payer: Self-pay | Admitting: Physical Medicine and Rehabilitation

## 2019-04-19 DIAGNOSIS — M25551 Pain in right hip: Secondary | ICD-10-CM

## 2019-04-19 NOTE — Telephone Encounter (Signed)
Please advise 

## 2019-04-20 NOTE — Telephone Encounter (Signed)
Patient reports that last injection did help for a few weeks, but pain is now getting worse. Scheduled for 7/9 at 0900.

## 2019-04-20 NOTE — Telephone Encounter (Signed)
If it helped a lot but not "long" then [facility] other wise will try TF approach

## 2019-05-05 ENCOUNTER — Ambulatory Visit: Payer: Self-pay

## 2019-05-05 ENCOUNTER — Ambulatory Visit (INDEPENDENT_AMBULATORY_CARE_PROVIDER_SITE_OTHER): Payer: Medicare Other | Admitting: Physical Medicine and Rehabilitation

## 2019-05-05 ENCOUNTER — Other Ambulatory Visit: Payer: Self-pay

## 2019-05-05 ENCOUNTER — Encounter: Payer: Self-pay | Admitting: Physical Medicine and Rehabilitation

## 2019-05-05 VITALS — BP 154/86 | HR 70

## 2019-05-05 DIAGNOSIS — M5416 Radiculopathy, lumbar region: Secondary | ICD-10-CM

## 2019-05-05 MED ORDER — BETAMETHASONE SOD PHOS & ACET 6 (3-3) MG/ML IJ SUSP
12.0000 mg | Freq: Once | INTRAMUSCULAR | Status: AC
  Administered 2019-05-05: 10:00:00 12 mg

## 2019-05-05 NOTE — Progress Notes (Signed)
Randall [name] - 73 y.o. male MRN [id number]  Date of birth: Mar 08, 1946  Office Visit Note: Visit Date: 05/05/2019 PCP: Hulan Fess, MD Referred by: Hulan Fess, MD  Subjective: Chief Complaint  Patient presents with  . Lower Back - Pain   HPI:  Randall [name] is a 73 y.o. male who comes in today For possible repeat L5-S1 interlaminar epidural steroid injection.  Injection was performed approximately a month and a half ago and he says he got a little bit of relief for about 2 weeks.  Really when reviewing with him how much relief he got it was not very much and it really did not past the 50% mark.  He has had a long history of lumbar spine problems with prior lumbar laminectomy and discectomy.  He has had degenerative changes with facet joint arthritis.  Pain is across the lower back worse with standing for any length of time worse with walking better at rest and sitting.  Most of his pain is in the back but he does get some referral anteriorly to the thighs.  No groin pain.  I think at this point given the amount of foraminal narrowing at L3 and L4 it is worth trying L3 transforaminal injection diagnostically.  He has had this in the past with good relief.  He has had radiofrequency ablation of the L4-5 and L5-S1 facet joints in the past with decent relief temporarily but at the time we were sidetracked with more leg pain.  He has no leg pain at this point no radicular pain.  I did discuss with the patient again about potential for spinal cord stimulator trial and he is going to think this over.  ROS Otherwise per HPI.  Assessment & Plan: Visit Diagnoses:  1. Lumbar radiculopathy     Plan: No additional findings.   Meds & Orders:  Meds ordered this encounter  Medications  . betamethasone acetate-betamethasone sodium phosphate (CELESTONE) injection 12 mg    Orders Placed This Encounter  Procedures  . XR C-ARM NO REPORT  . Epidural Steroid injection    Follow-up: Return if  symptoms worsen or fail to improve.   Procedures: No procedures performed  Lumbosacral Transforaminal Epidural Steroid Injection - Sub-Pedicular Approach with Fluoroscopic Guidance  Patient: Randall [name]      Date of Birth: 1946/04/13 MRN: [id number] PCP: Hulan Fess, MD      Visit Date: 05/05/2019   Universal Protocol:    Date/Time: 05/05/2019  Consent Given By: the patient  Position: PRONE  Additional Comments: Vital signs were monitored before and after the procedure. Patient was prepped and draped in the usual sterile fashion. The correct patient, procedure, and site was verified.   Injection Procedure Details:  Procedure Site One Meds Administered:  Meds ordered this encounter  Medications  . betamethasone acetate-betamethasone sodium phosphate (CELESTONE) injection 12 mg    Laterality: Bilateral  Location/Site:  L3-L4  Needle size: 22 G  Needle type: Spinal  Needle Placement: Transforaminal  Findings:    -Comments: Excellent flow of contrast along the nerve and into the epidural space.  Procedure Details: After squaring off the end-plates to get a true AP view, the C-arm was positioned so that an oblique view of the foramen as noted above was visualized. The target area is just inferior to the "nose of the scotty dog" or sub pedicular. The soft tissues overlying this structure were infiltrated with 2-3 ml. of 1% Lidocaine without Epinephrine.  The spinal needle  was inserted toward the target using a "trajectory" view along the fluoroscope beam.  Under AP and lateral visualization, the needle was advanced so it did not puncture dura and was located close the 6 O'Clock position of the pedical in AP tracterory. Biplanar projections were used to confirm position. Aspiration was confirmed to be negative for CSF and/or blood. A 1-2 ml. volume of Isovue-250 was injected and flow of contrast was noted at each level. Radiographs were obtained for documentation  purposes.   After attaining the desired flow of contrast documented above, a 0.5 to 1.0 ml test dose of 0.25% Marcaine was injected into each respective transforaminal space.  The patient was observed for 90 seconds post injection.  After no sensory deficits were reported, and normal lower extremity motor function was noted,   the above injectate was administered so that equal amounts of the injectate were placed at each foramen (level) into the transforaminal epidural space.   Additional Comments:  The patient tolerated the procedure well Dressing: 2 x 2 sterile gauze and Band-Aid    Post-procedure details: Patient was observed during the procedure. Post-procedure instructions were reviewed.  Patient left the clinic in stable condition.    Clinical History: MRI LUMBAR SPINE WITHOUT CONTRAST  TECHNIQUE: Multiplanar, multisequence MR imaging of the lumbar spine was performed. No intravenous contrast was administered.  COMPARISON: Lumbar MRI 01/18/2016 and earlier.  FINDINGS: Segmentation: Normal lumbar segmentation on a 2015 CT lumbar myelogram which is the same numbering system used on the 2017 MRI.  Alignment: Stable vertebral height and alignment. Relatively preserved lumbar lordosis.  Vertebrae: No marrow edema or evidence of acute osseous abnormality. Visualized bone marrow signal is within normal limits. Intact visible sacrum and SI joints.  Conus medullaris and cauda equina: Conus extends to the T12 level. No lower spinal cord or conus signal abnormality.  Paraspinal and other soft tissues: Chronic polycystic kidney disease, more apparent on the left. 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  Negative visualized posterior paraspinal soft tissues.  Disc levels:  T11-T12: Mild facet hypertrophy.  T12-L1: Mild left facet hypertrophy.  L1-L2: Mild facet hypertrophy.   L2-L3: Minimal circumferential disc bulge and endplate spurring. Mild to moderate facet hypertrophy. Chronic degenerative appearing right facet joint fluid. No spinal or lateral recess stenosis. Stable mild left L2 foraminal stenosis.  L3-L4: Chronic left eccentric disc bulge with broad-based left subarticular and foraminal component with annular fissure (series 7 image 27 and series 6, image 10). Superimposed moderate facet and ligament flavum hypertrophy with chronic facet joint fluid. Mild left lateral recess (left L4 nerve level), and moderate left L3 neural foraminal stenosis appears stable since 2017 without significant spinal stenosis. Mild contralateral right L3 foraminal stenosis is stable.  L4-L5: Chronic postoperative changes to the posterior elements of L4. Mild to moderate residual facet and ligament flavum hypertrophy is stable. Circumferential disc bulge with endplate spurring. Broad-based posterior and subarticular components. No significant spinal stenosis. Stable borderline to mild left lateral recess stenosis (left L5 nerve level). Stable mild to moderate bilateral L4 foraminal stenosis.  L5-S1: Stable and negative aside from epidural lipomatosis.  IMPRESSION: 1. Continued stable MRI appearance of the lumbar spine. No acute osseous abnormality. 2. 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 3. Chronic postoperative changes to the L4 posterior elements with mild residual lateral recess and up to moderate bilateral foraminal stenosis. 4. 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 This recommendation follows ACR consensus guidelines: White Paper of the ACR Incidental Findings Committee II on Vascular Findings. J Am Coll Radiol 2013; 10:789-794.   Electronically Signed By: Genevie Ann M.D. On: 06/21/2018 13:30  ------------------------------------ MRI of right hip IMPRESSION: 1. No stress fracture or AVN. 2. Bilateral peritendinosis, right greater than left. No associated trochanteric bursitis. 3. Moderate right-sided hamstring tendinopathy with surrounding inflammation/bursitis and reactive marrow edema in the ischium. This could be the source of the patient's pain. Recommend clinical correlation. Mild hamstring tendinopathy on the left. 4. No significant intrapelvic abnormalities.   Electronically Signed By: Marijo Sanes M.D. On: 01/14/2017 09:06     Objective:  VS:  HT:    WT:   BMI:     BP:(!) 154/86  HR:70bpm  TEMP: ( )  RESP:97 % Physical Exam  Ortho Exam Imaging: Xr C-arm No Report  Result Date: 05/05/2019 Please see Notes tab for imaging impression.

## 2019-05-05 NOTE — Progress Notes (Signed)
Bilateral L3  Numeric Pain Rating Scale and Functional Assessment Average Pain 8   In the last MONTH (on 0-10 scale) has pain interfered with the following?  1. General activity like being  able to carry out your everyday physical activities such as walking, climbing stairs, carrying groceries, or moving a chair?  Rating(5)  Lower back pain with bilateral leg pain to knees. When laying in bed will have numbness to right hip. Knees are now feeling weak.   Previous injections have been helpful. Last injection 4-5 weeks ago was helpful but did not last long, approximately 2 weeks.    +Driver, -BT (asprin), -Dye Allergies.

## 2019-05-05 NOTE — Procedures (Signed)
Lumbosacral Transforaminal Epidural Steroid Injection - Sub-Pedicular Approach with Fluoroscopic Guidance  Patient: Randall [name]      Date of Birth: Oct 29, 1945 MRN: [id number] PCP: Hulan Fess, MD      Visit Date: 05/05/2019   Universal Protocol:    Date/Time: 05/05/2019  Consent Given By: the patient  Position: PRONE  Additional Comments: Vital signs were monitored before and after the procedure. Patient was prepped and draped in the usual sterile fashion. The correct patient, procedure, and site was verified.   Injection Procedure Details:  Procedure Site One Meds Administered:  Meds ordered this encounter  Medications  . betamethasone acetate-betamethasone sodium phosphate (CELESTONE) injection 12 mg    Laterality: Bilateral  Location/Site:  L3-L4  Needle size: 22 G  Needle type: Spinal  Needle Placement: Transforaminal  Findings:    -Comments: Excellent flow of contrast along the nerve and into the epidural space.  Procedure Details: After squaring off the end-plates to get a true AP view, the C-arm was positioned so that an oblique view of the foramen as noted above was visualized. The target area is just inferior to the "nose of the scotty dog" or sub pedicular. The soft tissues overlying this structure were infiltrated with 2-3 ml. of 1% Lidocaine without Epinephrine.  The spinal needle was inserted toward the target using a "trajectory" view along the fluoroscope beam.  Under AP and lateral visualization, the needle was advanced so it did not puncture dura and was located close the 6 O'Clock position of the pedical in AP tracterory. Biplanar projections were used to confirm position. Aspiration was confirmed to be negative for CSF and/or blood. A 1-2 ml. volume of Isovue-250 was injected and flow of contrast was noted at each level. Radiographs were obtained for documentation purposes.   After attaining the desired flow of contrast documented above, a 0.5 to  1.0 ml test dose of 0.25% Marcaine was injected into each respective transforaminal space.  The patient was observed for 90 seconds post injection.  After no sensory deficits were reported, and normal lower extremity motor function was noted,   the above injectate was administered so that equal amounts of the injectate were placed at each foramen (level) into the transforaminal epidural space.   Additional Comments:  The patient tolerated the procedure well Dressing: 2 x 2 sterile gauze and Band-Aid    Post-procedure details: Patient was observed during the procedure. Post-procedure instructions were reviewed.  Patient left the clinic in stable condition.

## 2019-05-19 ENCOUNTER — Other Ambulatory Visit (INDEPENDENT_AMBULATORY_CARE_PROVIDER_SITE_OTHER): Payer: Self-pay | Admitting: Physical Medicine and Rehabilitation

## 2019-05-19 DIAGNOSIS — M25551 Pain in right hip: Secondary | ICD-10-CM

## 2019-05-19 NOTE — Telephone Encounter (Signed)
Please advise 

## 2019-06-16 ENCOUNTER — Other Ambulatory Visit (INDEPENDENT_AMBULATORY_CARE_PROVIDER_SITE_OTHER): Payer: Self-pay | Admitting: Physical Medicine and Rehabilitation

## 2019-06-16 DIAGNOSIS — M25551 Pain in right hip: Secondary | ICD-10-CM

## 2019-06-16 NOTE — Telephone Encounter (Signed)
Please advise 

## 2019-06-28 DIAGNOSIS — B182 Chronic viral hepatitis C: Secondary | ICD-10-CM | POA: Diagnosis not present

## 2019-07-04 DIAGNOSIS — Z23 Encounter for immunization: Secondary | ICD-10-CM | POA: Diagnosis not present

## 2019-07-07 DIAGNOSIS — Z23 Encounter for immunization: Secondary | ICD-10-CM | POA: Diagnosis not present

## 2019-07-14 ENCOUNTER — Other Ambulatory Visit (INDEPENDENT_AMBULATORY_CARE_PROVIDER_SITE_OTHER): Payer: Self-pay | Admitting: Physical Medicine and Rehabilitation

## 2019-07-14 DIAGNOSIS — B182 Chronic viral hepatitis C: Secondary | ICD-10-CM | POA: Diagnosis not present

## 2019-07-14 DIAGNOSIS — M25551 Pain in right hip: Secondary | ICD-10-CM

## 2019-07-14 IMAGING — MR MR LUMBAR SPINE W/O CM
4 of 5 series · 24 of 48 positions shown · non-contrast
Comparison: Lumbar MRI 01/18/2016 and earlier.

CLINICAL DATA: 72-year-old male with 2 months of low back pain,
bilateral leg pain. No known injury.

EXAM:
MRI LUMBAR SPINE WITHOUT CONTRAST
TECHNIQUE: Multiplanar, multisequence MR imaging of the lumbar spine was
performed. No intravenous contrast was administered.

[Series 4: T2 · sagittal · 4.0mm · 0.57mm/px · 6 of 15 slices shown (1 of 2)]
[im 1/15]
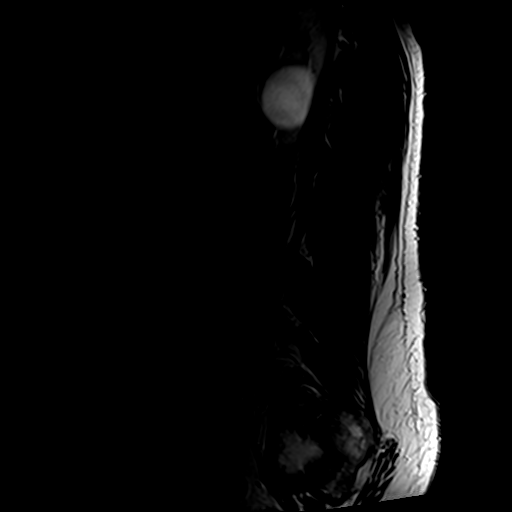
[im 3/15]
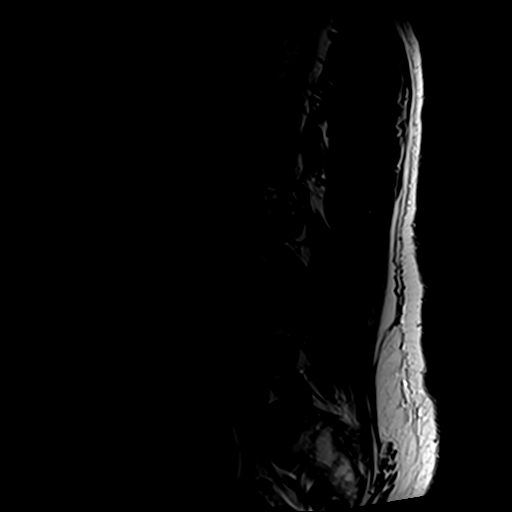
[im 6/15]
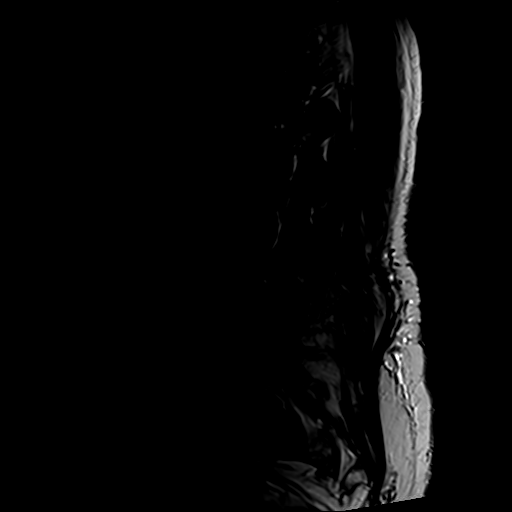
[im 9/15]
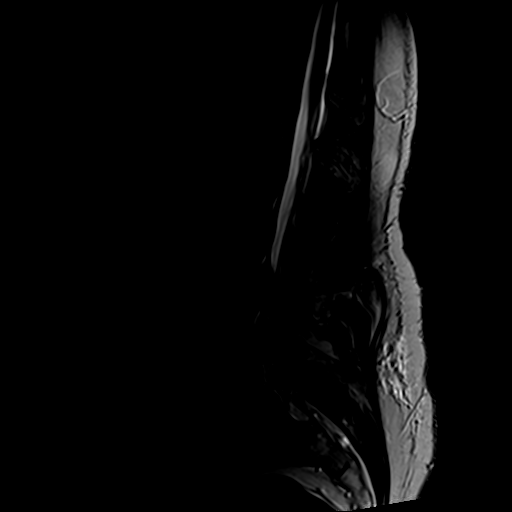
[im 12/15]
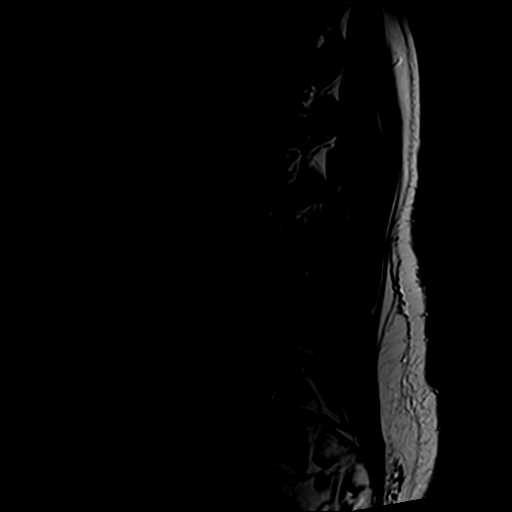
[im 15/15]
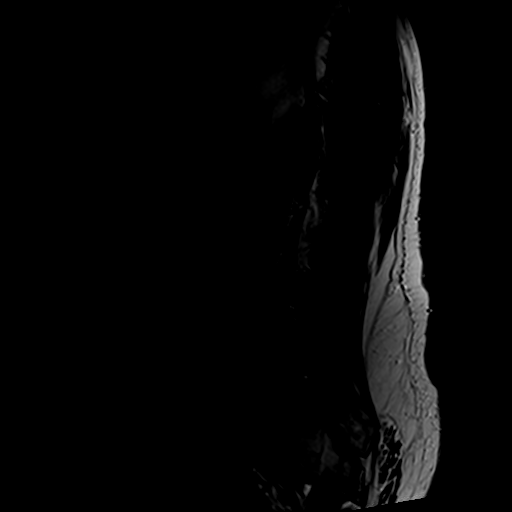

[Series 5: T1 · sagittal · 4.0mm · 0.57mm/px · 6 of 15 slices shown (1 of 2)]
[im 1/15]
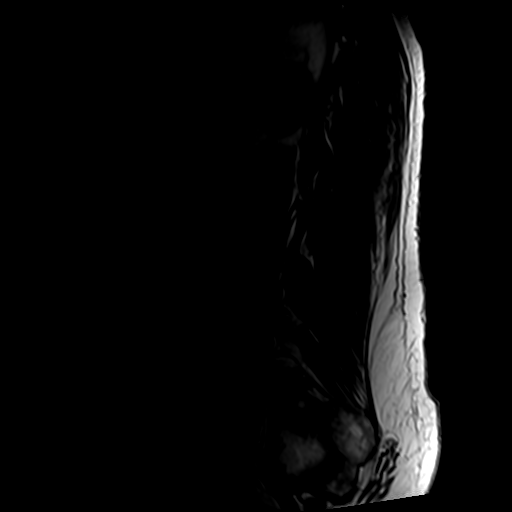
[im 3/15]
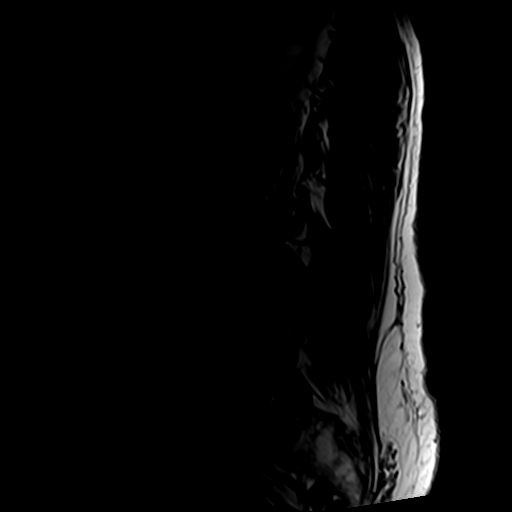
[im 6/15]
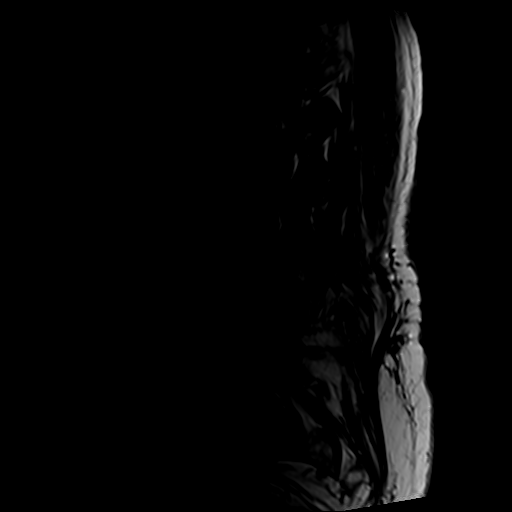
[im 9/15]
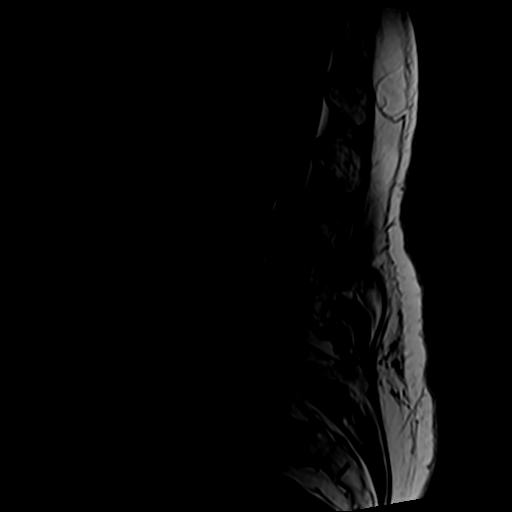
[im 12/15]
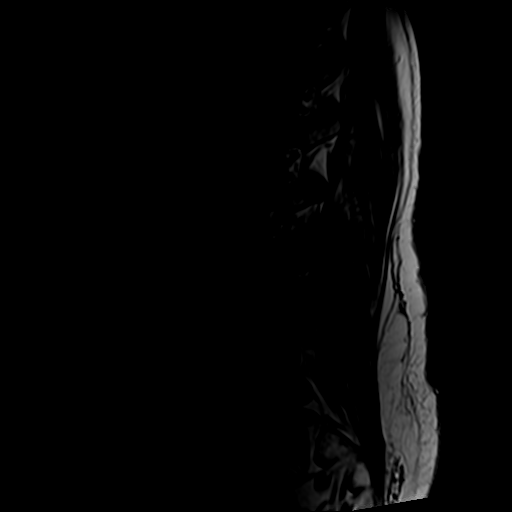
[im 15/15]
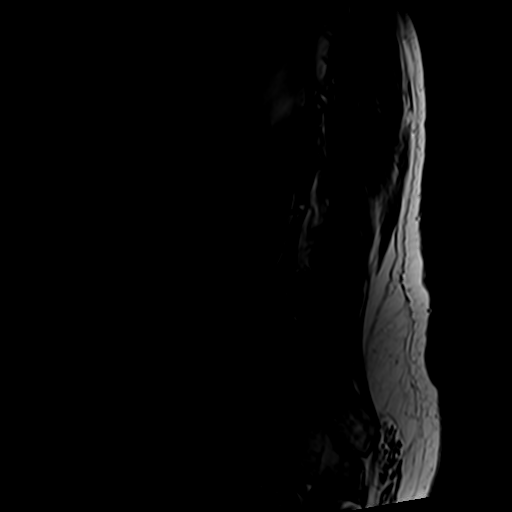

[Series 7: T2 · axial · 4.0mm · 0.70mm/px · z∈[-149,+79]mm · 9 of 41 slices shown (2 of 2)]
[im 1/41]
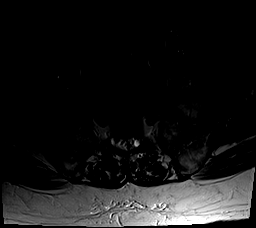
[im 6/41]
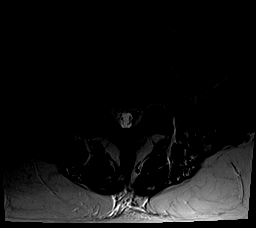
[im 12/41]
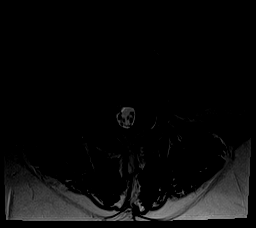
[im 18/41]
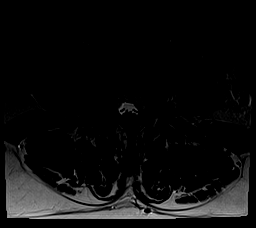
[im 21/41]
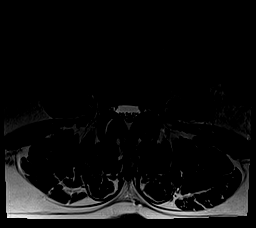
[im 23/41]
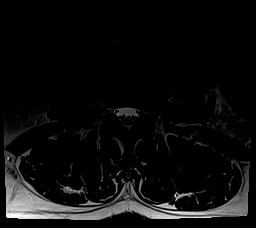
[im 29/41]
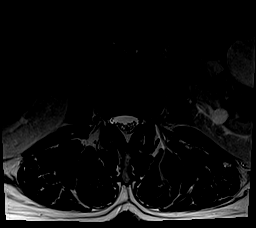
[im 35/41]
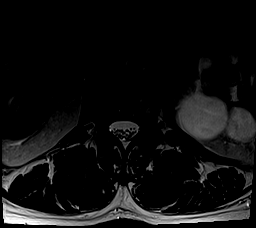
[im 41/41]
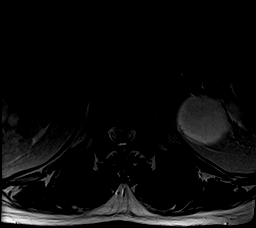

[Series 8: T1 · axial · 4.0mm · 0.35mm/px · z∈[-124,+49]mm · 3 of 41 slices shown (2 of 2)]
[im 6/41]
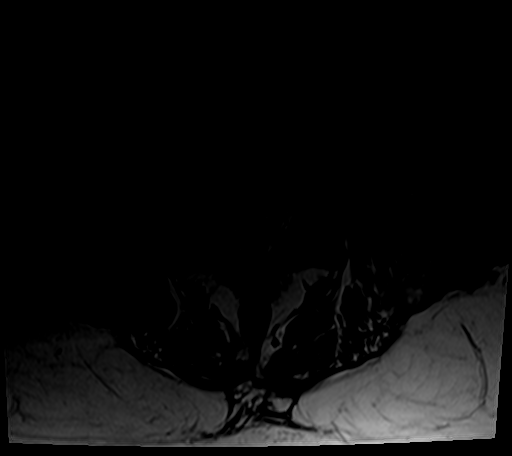
[im 21/41]
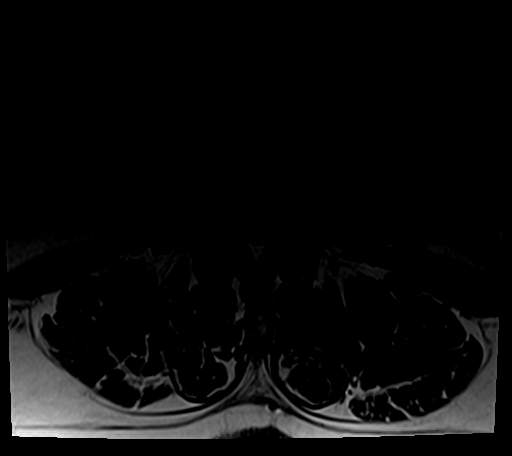
[im 35/41]
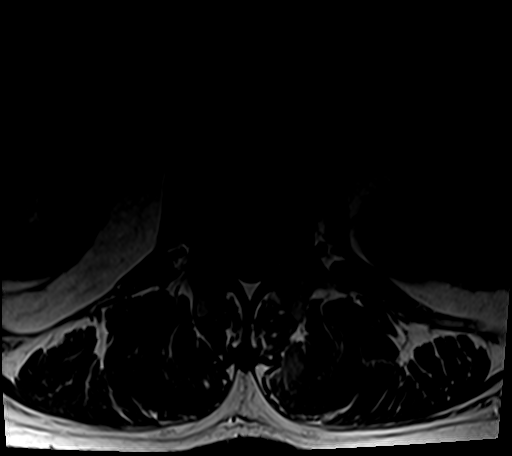

[24 of 48 positions shown; findings below may reference images not displayed]

FINDINGS: Segmentation: Normal lumbar segmentation on a 5535 CT lumbar
myelogram which is the same numbering system used on the 5632 MRI.

Alignment: Stable vertebral height and alignment. Relatively
preserved lumbar lordosis.

Vertebrae: No marrow edema or evidence of acute osseous abnormality.
Visualized bone marrow signal is within normal limits. Intact
visible sacrum and SI joints.

Conus medullaris and cauda equina: Conus extends to the T12 level.
No lower spinal cord or conus signal abnormality.

Paraspinal and other soft tissues: Chronic polycystic kidney
disease, more apparent on the left. Chronic ectasia of the
infrarenal abdominal aorta, diameter estimated at 32 millimeters now
(31 millimeters in 5632).

Negative visualized posterior paraspinal soft tissues.

Disc levels:

T11-T12: Mild facet hypertrophy.

T12-L1:  Mild left facet hypertrophy.

L1-L2:  Mild facet hypertrophy.

L2-L3: Minimal circumferential disc bulge and endplate spurring.
Mild to moderate facet hypertrophy. Chronic degenerative appearing
right facet joint fluid. No spinal or lateral recess stenosis.
Stable mild left L2 foraminal stenosis.

L3-L4: Chronic left eccentric disc bulge with broad-based left
subarticular and foraminal component with annular fissure (series 7
image 27 and series 6, image 10). Superimposed moderate facet and
ligament flavum hypertrophy with chronic facet joint fluid. Mild
left lateral recess (left L4 nerve level), and moderate left L3
neural foraminal stenosis appears stable since 5632 without
significant spinal stenosis. Mild contralateral right L3 foraminal
stenosis is stable.

L4-L5: Chronic postoperative changes to the posterior elements of
L4. Mild to moderate residual facet and ligament flavum hypertrophy
is stable. Circumferential disc bulge with endplate spurring.
Broad-based posterior and subarticular components. No significant
spinal stenosis. Stable borderline to mild left lateral recess
stenosis (left L5 nerve level). Stable mild to moderate bilateral L4
foraminal stenosis.

L5-S1:  Stable and negative aside from epidural lipomatosis.
IMPRESSION: 1. Continued stable MRI appearance of the lumbar spine. No acute
osseous abnormality.
2. Leftward disc degeneration at L3-L4 with up to moderate left
neural foraminal and lateral recess involvement.
3. Chronic postoperative changes to the L4 posterior elements with
mild residual lateral recess and up to moderate bilateral foraminal
stenosis.
4. Chronic mild infrarenal abdominal aortic aneurysm now estimated
at 32 mm diameter Recommend followup by Ultrasound in 3 years. This
recommendation follows ACR consensus guidelines: White Paper of the
ACR Incidental Findings Committee II on Vascular Findings. [HOSPITAL] 0033; [DATE].

## 2019-07-14 NOTE — Telephone Encounter (Signed)
Please Advise Rx 

## 2019-08-11 ENCOUNTER — Other Ambulatory Visit (INDEPENDENT_AMBULATORY_CARE_PROVIDER_SITE_OTHER): Payer: Self-pay | Admitting: Physical Medicine and Rehabilitation

## 2019-08-11 DIAGNOSIS — M25551 Pain in right hip: Secondary | ICD-10-CM

## 2019-08-11 NOTE — Telephone Encounter (Signed)
Please advise 

## 2019-08-16 DIAGNOSIS — R350 Frequency of micturition: Secondary | ICD-10-CM | POA: Diagnosis not present

## 2019-08-16 DIAGNOSIS — N401 Enlarged prostate with lower urinary tract symptoms: Secondary | ICD-10-CM | POA: Diagnosis not present

## 2019-08-16 DIAGNOSIS — R351 Nocturia: Secondary | ICD-10-CM | POA: Diagnosis not present

## 2019-08-16 DIAGNOSIS — R3915 Urgency of urination: Secondary | ICD-10-CM | POA: Diagnosis not present

## 2019-09-01 DIAGNOSIS — N401 Enlarged prostate with lower urinary tract symptoms: Secondary | ICD-10-CM | POA: Diagnosis not present

## 2019-09-01 DIAGNOSIS — R350 Frequency of micturition: Secondary | ICD-10-CM | POA: Diagnosis not present

## 2019-09-08 ENCOUNTER — Other Ambulatory Visit (INDEPENDENT_AMBULATORY_CARE_PROVIDER_SITE_OTHER): Payer: Self-pay | Admitting: Physical Medicine and Rehabilitation

## 2019-09-08 DIAGNOSIS — M25551 Pain in right hip: Secondary | ICD-10-CM

## 2019-09-08 NOTE — Telephone Encounter (Signed)
Please advise 

## 2019-09-26 DIAGNOSIS — B182 Chronic viral hepatitis C: Secondary | ICD-10-CM | POA: Diagnosis not present

## 2019-10-05 DIAGNOSIS — K7401 Hepatic fibrosis, early fibrosis: Secondary | ICD-10-CM | POA: Diagnosis not present

## 2019-10-05 DIAGNOSIS — B182 Chronic viral hepatitis C: Secondary | ICD-10-CM | POA: Diagnosis not present

## 2019-10-07 ENCOUNTER — Other Ambulatory Visit (INDEPENDENT_AMBULATORY_CARE_PROVIDER_SITE_OTHER): Payer: Self-pay | Admitting: Physical Medicine and Rehabilitation

## 2019-10-07 DIAGNOSIS — M25551 Pain in right hip: Secondary | ICD-10-CM

## 2019-10-07 NOTE — Telephone Encounter (Signed)
Please advise 

## 2019-10-13 DIAGNOSIS — Z87891 Personal history of nicotine dependence: Secondary | ICD-10-CM | POA: Diagnosis not present

## 2019-10-13 DIAGNOSIS — I779 Disorder of arteries and arterioles, unspecified: Secondary | ICD-10-CM | POA: Diagnosis not present

## 2019-10-13 DIAGNOSIS — Z8619 Personal history of other infectious and parasitic diseases: Secondary | ICD-10-CM | POA: Diagnosis not present

## 2019-10-13 DIAGNOSIS — N401 Enlarged prostate with lower urinary tract symptoms: Secondary | ICD-10-CM | POA: Diagnosis not present

## 2019-10-13 DIAGNOSIS — I714 Abdominal aortic aneurysm, without rupture: Secondary | ICD-10-CM | POA: Diagnosis not present

## 2019-10-13 DIAGNOSIS — Z23 Encounter for immunization: Secondary | ICD-10-CM | POA: Diagnosis not present

## 2019-10-13 DIAGNOSIS — I10 Essential (primary) hypertension: Secondary | ICD-10-CM | POA: Diagnosis not present

## 2019-10-13 DIAGNOSIS — M545 Low back pain: Secondary | ICD-10-CM | POA: Diagnosis not present

## 2019-10-13 DIAGNOSIS — E785 Hyperlipidemia, unspecified: Secondary | ICD-10-CM | POA: Diagnosis not present

## 2019-10-13 DIAGNOSIS — J449 Chronic obstructive pulmonary disease, unspecified: Secondary | ICD-10-CM | POA: Diagnosis not present

## 2019-10-13 DIAGNOSIS — Z86010 Personal history of colonic polyps: Secondary | ICD-10-CM | POA: Diagnosis not present

## 2019-10-13 DIAGNOSIS — Z0000 Encounter for general adult medical examination without abnormal findings: Secondary | ICD-10-CM | POA: Diagnosis not present

## 2019-10-14 ENCOUNTER — Other Ambulatory Visit: Payer: Self-pay | Admitting: Family Medicine

## 2019-10-14 DIAGNOSIS — I779 Disorder of arteries and arterioles, unspecified: Secondary | ICD-10-CM

## 2019-10-19 ENCOUNTER — Ambulatory Visit
Admission: RE | Admit: 2019-10-19 | Discharge: 2019-10-19 | Disposition: A | Discharge status: Home / Self Care | Payer: Medicare Other | Source: Ambulatory Visit | Attending: Family Medicine | Admitting: Family Medicine

## 2019-10-19 DIAGNOSIS — I714 Abdominal aortic aneurysm, without rupture, unspecified: Secondary | ICD-10-CM

## 2019-10-24 ENCOUNTER — Ambulatory Visit
Admission: RE | Admit: 2019-10-24 | Discharge: 2019-10-24 | Disposition: A | Discharge status: Home / Self Care | Payer: Medicare Other | Source: Ambulatory Visit | Attending: Family Medicine | Admitting: Family Medicine

## 2019-10-24 DIAGNOSIS — I779 Disorder of arteries and arterioles, unspecified: Secondary | ICD-10-CM

## 2019-11-07 ENCOUNTER — Other Ambulatory Visit (INDEPENDENT_AMBULATORY_CARE_PROVIDER_SITE_OTHER): Payer: Self-pay | Admitting: Physical Medicine and Rehabilitation

## 2019-11-07 DIAGNOSIS — M25551 Pain in right hip: Secondary | ICD-10-CM

## 2019-11-07 NOTE — Telephone Encounter (Signed)
Please advise 

## 2019-11-28 ENCOUNTER — Other Ambulatory Visit: Payer: Self-pay

## 2019-11-28 ENCOUNTER — Ambulatory Visit
Admission: RE | Admit: 2019-11-28 | Discharge: 2019-11-28 | Disposition: A | Discharge status: Home / Self Care | Payer: Medicare Other | Source: Ambulatory Visit | Attending: Acute Care | Admitting: Acute Care

## 2019-11-28 DIAGNOSIS — Z87891 Personal history of nicotine dependence: Secondary | ICD-10-CM

## 2019-11-28 DIAGNOSIS — Z122 Encounter for screening for malignant neoplasm of respiratory organs: Secondary | ICD-10-CM

## 2019-11-28 NOTE — Progress Notes (Signed)
Please call patient and let them  know their  low dose Ct was read as a Lung RADS 1, negative study: no nodules or definitely benign nodules. Radiology recommendation is for a repeat LDCT in 12 months. .Please let them  know we will order and schedule their  annual screening scan for 10/2020. Please let them  know there was notation of CAD on their  scan.  Please remind the patient  that this is a non-gated exam therefore degree or severity of disease  cannot be determined. Please have them  follow up with their PCP regarding potential risk factor modification, dietary therapy or pharmacologic therapy if clinically indicated. Pt.  is  currently on statin therapy. Please place order for annual  screening scan for  10/2020 and fax results to PCP. Thanks so much.

## 2019-11-29 ENCOUNTER — Other Ambulatory Visit: Payer: Self-pay | Admitting: *Deleted

## 2019-11-29 DIAGNOSIS — Z87891 Personal history of nicotine dependence: Secondary | ICD-10-CM

## 2019-12-05 ENCOUNTER — Other Ambulatory Visit (INDEPENDENT_AMBULATORY_CARE_PROVIDER_SITE_OTHER): Payer: Self-pay | Admitting: Physical Medicine and Rehabilitation

## 2019-12-05 DIAGNOSIS — M25551 Pain in right hip: Secondary | ICD-10-CM

## 2019-12-05 NOTE — Telephone Encounter (Signed)
Please advise 

## 2019-12-25 ENCOUNTER — Ambulatory Visit: Payer: Medicare Other | Attending: Internal Medicine

## 2019-12-25 ENCOUNTER — Other Ambulatory Visit: Payer: Self-pay

## 2019-12-25 DIAGNOSIS — Z23 Encounter for immunization: Secondary | ICD-10-CM | POA: Insufficient documentation

## 2019-12-25 NOTE — Progress Notes (Signed)
   Covid-19 Vaccination Clinic  Name:  SPIRIDON GAUDREAULT    MRN: [id number] DOB: 1946/05/07  12/25/2019  Mr. Sweda was observed post Covid-19 immunization for 15 minutes without incidence. He was provided with Vaccine Information Sheet and instruction to access the V-Safe system.   Mr. Schuth was instructed to call 911 with any severe reactions post vaccine: Marland Kitchen Difficulty breathing  . Swelling of your face and throat  . A fast heartbeat  . A bad rash all over your body  . Dizziness and weakness    Immunizations Administered    Name Date Dose VIS Date Route   Pfizer COVID-19 Vaccine 12/25/2019  1:21 PM 0.3 mL 10/07/2019 Intramuscular   Manufacturer: [place]   Lot: [id number]   [place]: [id number]

## 2020-01-02 ENCOUNTER — Other Ambulatory Visit (INDEPENDENT_AMBULATORY_CARE_PROVIDER_SITE_OTHER): Payer: Self-pay | Admitting: Physical Medicine and Rehabilitation

## 2020-01-02 DIAGNOSIS — M25551 Pain in right hip: Secondary | ICD-10-CM

## 2020-01-02 NOTE — Telephone Encounter (Signed)
Please Advise

## 2020-01-18 DIAGNOSIS — Z79899 Other long term (current) drug therapy: Secondary | ICD-10-CM | POA: Diagnosis not present

## 2020-01-18 DIAGNOSIS — E785 Hyperlipidemia, unspecified: Secondary | ICD-10-CM | POA: Diagnosis not present

## 2020-01-18 DIAGNOSIS — R899 Unspecified abnormal finding in specimens from other organs, systems and tissues: Secondary | ICD-10-CM | POA: Diagnosis not present

## 2020-01-24 ENCOUNTER — Ambulatory Visit: Payer: Medicare Other | Attending: Internal Medicine

## 2020-01-24 DIAGNOSIS — Z23 Encounter for immunization: Secondary | ICD-10-CM

## 2020-01-24 NOTE — Progress Notes (Signed)
   Covid-19 Vaccination Clinic  Name:  Randall [name]    MRN: [id number] DOB: 07/13/1946  01/24/2020  Randall [name] was observed post Covid-19 immunization for 15 minutes without incident. He was provided with Vaccine Information Sheet and instruction to access the V-Safe system.   Randall [name] was instructed to call 911 with any severe reactions post vaccine: Marland Kitchen Difficulty breathing  . Swelling of face and throat  . A fast heartbeat  . A bad rash all over body  . Dizziness and weakness   Immunizations Administered    Name Date Dose VIS Date Route   Pfizer COVID-19 Vaccine 01/24/2020  1:20 PM 0.3 mL 10/07/2019 Intramuscular   Manufacturer: [place]   Lot: [id number]   [place]: [id number]

## 2020-02-02 ENCOUNTER — Other Ambulatory Visit (INDEPENDENT_AMBULATORY_CARE_PROVIDER_SITE_OTHER): Payer: Self-pay | Admitting: Physical Medicine and Rehabilitation

## 2020-02-02 DIAGNOSIS — M25551 Pain in right hip: Secondary | ICD-10-CM

## 2020-02-02 NOTE — Telephone Encounter (Signed)
Please advise 

## 2020-02-21 DIAGNOSIS — J449 Chronic obstructive pulmonary disease, unspecified: Secondary | ICD-10-CM | POA: Diagnosis not present

## 2020-02-21 DIAGNOSIS — M47812 Spondylosis without myelopathy or radiculopathy, cervical region: Secondary | ICD-10-CM | POA: Diagnosis not present

## 2020-02-21 DIAGNOSIS — M159 Polyosteoarthritis, unspecified: Secondary | ICD-10-CM | POA: Diagnosis not present

## 2020-02-21 DIAGNOSIS — N401 Enlarged prostate with lower urinary tract symptoms: Secondary | ICD-10-CM | POA: Diagnosis not present

## 2020-02-21 DIAGNOSIS — I10 Essential (primary) hypertension: Secondary | ICD-10-CM | POA: Diagnosis not present

## 2020-02-21 DIAGNOSIS — E785 Hyperlipidemia, unspecified: Secondary | ICD-10-CM | POA: Diagnosis not present

## 2020-02-21 DIAGNOSIS — J441 Chronic obstructive pulmonary disease with (acute) exacerbation: Secondary | ICD-10-CM | POA: Diagnosis not present

## 2020-03-02 ENCOUNTER — Other Ambulatory Visit (INDEPENDENT_AMBULATORY_CARE_PROVIDER_SITE_OTHER): Payer: Self-pay | Admitting: Physical Medicine and Rehabilitation

## 2020-03-02 DIAGNOSIS — M25551 Pain in right hip: Secondary | ICD-10-CM

## 2020-03-02 NOTE — Telephone Encounter (Signed)
Please advise 

## 2020-04-02 ENCOUNTER — Other Ambulatory Visit (INDEPENDENT_AMBULATORY_CARE_PROVIDER_SITE_OTHER): Payer: Self-pay | Admitting: Physical Medicine and Rehabilitation

## 2020-04-02 DIAGNOSIS — M25551 Pain in right hip: Secondary | ICD-10-CM

## 2020-04-02 NOTE — Telephone Encounter (Signed)
Please advise 

## 2020-04-06 DIAGNOSIS — R350 Frequency of micturition: Secondary | ICD-10-CM | POA: Diagnosis not present

## 2020-04-06 DIAGNOSIS — R351 Nocturia: Secondary | ICD-10-CM | POA: Diagnosis not present

## 2020-04-06 DIAGNOSIS — R3915 Urgency of urination: Secondary | ICD-10-CM | POA: Diagnosis not present

## 2020-04-06 DIAGNOSIS — N401 Enlarged prostate with lower urinary tract symptoms: Secondary | ICD-10-CM | POA: Diagnosis not present

## 2020-04-23 DIAGNOSIS — H2513 Age-related nuclear cataract, bilateral: Secondary | ICD-10-CM | POA: Diagnosis not present

## 2020-04-23 DIAGNOSIS — H524 Presbyopia: Secondary | ICD-10-CM | POA: Diagnosis not present

## 2020-04-23 DIAGNOSIS — H353131 Nonexudative age-related macular degeneration, bilateral, early dry stage: Secondary | ICD-10-CM | POA: Diagnosis not present

## 2020-04-30 ENCOUNTER — Other Ambulatory Visit (INDEPENDENT_AMBULATORY_CARE_PROVIDER_SITE_OTHER): Payer: Self-pay | Admitting: Physical Medicine and Rehabilitation

## 2020-04-30 DIAGNOSIS — M25551 Pain in right hip: Secondary | ICD-10-CM

## 2020-05-01 NOTE — Telephone Encounter (Signed)
Please Advise Rx.

## 2020-05-10 DIAGNOSIS — E785 Hyperlipidemia, unspecified: Secondary | ICD-10-CM | POA: Diagnosis not present

## 2020-05-10 DIAGNOSIS — M47812 Spondylosis without myelopathy or radiculopathy, cervical region: Secondary | ICD-10-CM | POA: Diagnosis not present

## 2020-05-10 DIAGNOSIS — I10 Essential (primary) hypertension: Secondary | ICD-10-CM | POA: Diagnosis not present

## 2020-05-10 DIAGNOSIS — M159 Polyosteoarthritis, unspecified: Secondary | ICD-10-CM | POA: Diagnosis not present

## 2020-05-10 DIAGNOSIS — N401 Enlarged prostate with lower urinary tract symptoms: Secondary | ICD-10-CM | POA: Diagnosis not present

## 2020-05-10 DIAGNOSIS — J441 Chronic obstructive pulmonary disease with (acute) exacerbation: Secondary | ICD-10-CM | POA: Diagnosis not present

## 2020-05-10 DIAGNOSIS — J449 Chronic obstructive pulmonary disease, unspecified: Secondary | ICD-10-CM | POA: Diagnosis not present

## 2020-05-31 ENCOUNTER — Other Ambulatory Visit (INDEPENDENT_AMBULATORY_CARE_PROVIDER_SITE_OTHER): Payer: Self-pay | Admitting: Physical Medicine and Rehabilitation

## 2020-05-31 DIAGNOSIS — M25551 Pain in right hip: Secondary | ICD-10-CM

## 2020-05-31 NOTE — Telephone Encounter (Signed)
Please advise 

## 2020-07-03 ENCOUNTER — Other Ambulatory Visit (INDEPENDENT_AMBULATORY_CARE_PROVIDER_SITE_OTHER): Payer: Self-pay | Admitting: Physical Medicine and Rehabilitation

## 2020-07-03 DIAGNOSIS — M25551 Pain in right hip: Secondary | ICD-10-CM

## 2020-07-04 NOTE — Telephone Encounter (Signed)
Please advise 

## 2020-07-30 ENCOUNTER — Other Ambulatory Visit (INDEPENDENT_AMBULATORY_CARE_PROVIDER_SITE_OTHER): Payer: Self-pay | Admitting: Physical Medicine and Rehabilitation

## 2020-07-30 DIAGNOSIS — M25551 Pain in right hip: Secondary | ICD-10-CM

## 2020-07-30 NOTE — Telephone Encounter (Signed)
Please advise 

## 2020-08-22 DIAGNOSIS — I10 Essential (primary) hypertension: Secondary | ICD-10-CM | POA: Diagnosis not present

## 2020-08-22 DIAGNOSIS — N401 Enlarged prostate with lower urinary tract symptoms: Secondary | ICD-10-CM | POA: Diagnosis not present

## 2020-08-22 DIAGNOSIS — M159 Polyosteoarthritis, unspecified: Secondary | ICD-10-CM | POA: Diagnosis not present

## 2020-08-22 DIAGNOSIS — E785 Hyperlipidemia, unspecified: Secondary | ICD-10-CM | POA: Diagnosis not present

## 2020-08-22 DIAGNOSIS — M47812 Spondylosis without myelopathy or radiculopathy, cervical region: Secondary | ICD-10-CM | POA: Diagnosis not present

## 2020-08-22 DIAGNOSIS — J441 Chronic obstructive pulmonary disease with (acute) exacerbation: Secondary | ICD-10-CM | POA: Diagnosis not present

## 2020-08-22 DIAGNOSIS — J449 Chronic obstructive pulmonary disease, unspecified: Secondary | ICD-10-CM | POA: Diagnosis not present

## 2020-08-30 ENCOUNTER — Other Ambulatory Visit (INDEPENDENT_AMBULATORY_CARE_PROVIDER_SITE_OTHER): Payer: Self-pay | Admitting: Physical Medicine and Rehabilitation

## 2020-08-30 DIAGNOSIS — M25551 Pain in right hip: Secondary | ICD-10-CM

## 2020-08-30 NOTE — Telephone Encounter (Signed)
Please advise 

## 2020-10-01 ENCOUNTER — Other Ambulatory Visit (INDEPENDENT_AMBULATORY_CARE_PROVIDER_SITE_OTHER): Payer: Self-pay | Admitting: Physical Medicine and Rehabilitation

## 2020-10-01 DIAGNOSIS — M25551 Pain in right hip: Secondary | ICD-10-CM

## 2020-10-01 NOTE — Telephone Encounter (Signed)
Please advise 

## 2020-10-22 DIAGNOSIS — Z23 Encounter for immunization: Secondary | ICD-10-CM | POA: Diagnosis not present

## 2020-10-31 ENCOUNTER — Other Ambulatory Visit (INDEPENDENT_AMBULATORY_CARE_PROVIDER_SITE_OTHER): Payer: Self-pay | Admitting: Physical Medicine and Rehabilitation

## 2020-10-31 DIAGNOSIS — M25551 Pain in right hip: Secondary | ICD-10-CM

## 2020-10-31 NOTE — Telephone Encounter (Signed)
Please advise 

## 2020-11-08 DIAGNOSIS — I10 Essential (primary) hypertension: Secondary | ICD-10-CM | POA: Diagnosis not present

## 2020-11-08 DIAGNOSIS — M47812 Spondylosis without myelopathy or radiculopathy, cervical region: Secondary | ICD-10-CM | POA: Diagnosis not present

## 2020-11-08 DIAGNOSIS — Z8619 Personal history of other infectious and parasitic diseases: Secondary | ICD-10-CM | POA: Diagnosis not present

## 2020-11-08 DIAGNOSIS — G2581 Restless legs syndrome: Secondary | ICD-10-CM | POA: Diagnosis not present

## 2020-11-08 DIAGNOSIS — L03113 Cellulitis of right upper limb: Secondary | ICD-10-CM | POA: Diagnosis not present

## 2020-11-08 DIAGNOSIS — J441 Chronic obstructive pulmonary disease with (acute) exacerbation: Secondary | ICD-10-CM | POA: Diagnosis not present

## 2020-11-08 DIAGNOSIS — N289 Disorder of kidney and ureter, unspecified: Secondary | ICD-10-CM | POA: Diagnosis not present

## 2020-11-08 DIAGNOSIS — M159 Polyosteoarthritis, unspecified: Secondary | ICD-10-CM | POA: Diagnosis not present

## 2020-11-08 DIAGNOSIS — Z0000 Encounter for general adult medical examination without abnormal findings: Secondary | ICD-10-CM | POA: Diagnosis not present

## 2020-11-08 DIAGNOSIS — Z125 Encounter for screening for malignant neoplasm of prostate: Secondary | ICD-10-CM | POA: Diagnosis not present

## 2020-11-08 DIAGNOSIS — I714 Abdominal aortic aneurysm, without rupture: Secondary | ICD-10-CM | POA: Diagnosis not present

## 2020-11-08 DIAGNOSIS — J449 Chronic obstructive pulmonary disease, unspecified: Secondary | ICD-10-CM | POA: Diagnosis not present

## 2020-11-08 DIAGNOSIS — I779 Disorder of arteries and arterioles, unspecified: Secondary | ICD-10-CM | POA: Diagnosis not present

## 2020-11-08 DIAGNOSIS — N401 Enlarged prostate with lower urinary tract symptoms: Secondary | ICD-10-CM | POA: Diagnosis not present

## 2020-11-08 DIAGNOSIS — E785 Hyperlipidemia, unspecified: Secondary | ICD-10-CM | POA: Diagnosis not present

## 2020-11-08 DIAGNOSIS — S61411A Laceration without foreign body of right hand, initial encounter: Secondary | ICD-10-CM | POA: Diagnosis not present

## 2020-12-03 ENCOUNTER — Other Ambulatory Visit (INDEPENDENT_AMBULATORY_CARE_PROVIDER_SITE_OTHER): Payer: Self-pay | Admitting: Physical Medicine and Rehabilitation

## 2020-12-03 DIAGNOSIS — M25551 Pain in right hip: Secondary | ICD-10-CM

## 2020-12-03 NOTE — Telephone Encounter (Signed)
Please Advise

## 2020-12-05 DIAGNOSIS — I779 Disorder of arteries and arterioles, unspecified: Secondary | ICD-10-CM | POA: Diagnosis not present

## 2020-12-05 DIAGNOSIS — E785 Hyperlipidemia, unspecified: Secondary | ICD-10-CM | POA: Diagnosis not present

## 2020-12-05 DIAGNOSIS — I714 Abdominal aortic aneurysm, without rupture: Secondary | ICD-10-CM | POA: Diagnosis not present

## 2020-12-05 DIAGNOSIS — Z8619 Personal history of other infectious and parasitic diseases: Secondary | ICD-10-CM | POA: Diagnosis not present

## 2020-12-05 DIAGNOSIS — J449 Chronic obstructive pulmonary disease, unspecified: Secondary | ICD-10-CM | POA: Diagnosis not present

## 2020-12-05 DIAGNOSIS — M159 Polyosteoarthritis, unspecified: Secondary | ICD-10-CM | POA: Diagnosis not present

## 2020-12-05 DIAGNOSIS — G2581 Restless legs syndrome: Secondary | ICD-10-CM | POA: Diagnosis not present

## 2020-12-05 DIAGNOSIS — N289 Disorder of kidney and ureter, unspecified: Secondary | ICD-10-CM | POA: Diagnosis not present

## 2020-12-05 DIAGNOSIS — N401 Enlarged prostate with lower urinary tract symptoms: Secondary | ICD-10-CM | POA: Diagnosis not present

## 2020-12-05 DIAGNOSIS — I10 Essential (primary) hypertension: Secondary | ICD-10-CM | POA: Diagnosis not present

## 2020-12-05 DIAGNOSIS — L03113 Cellulitis of right upper limb: Secondary | ICD-10-CM | POA: Diagnosis not present

## 2020-12-05 DIAGNOSIS — Z87891 Personal history of nicotine dependence: Secondary | ICD-10-CM | POA: Diagnosis not present

## 2020-12-24 DIAGNOSIS — N401 Enlarged prostate with lower urinary tract symptoms: Secondary | ICD-10-CM | POA: Diagnosis not present

## 2020-12-24 DIAGNOSIS — R3915 Urgency of urination: Secondary | ICD-10-CM | POA: Diagnosis not present

## 2020-12-31 ENCOUNTER — Other Ambulatory Visit (INDEPENDENT_AMBULATORY_CARE_PROVIDER_SITE_OTHER): Payer: Self-pay | Admitting: Physical Medicine and Rehabilitation

## 2020-12-31 DIAGNOSIS — M25551 Pain in right hip: Secondary | ICD-10-CM

## 2020-12-31 NOTE — Telephone Encounter (Signed)
Please advise 

## 2021-01-17 DIAGNOSIS — M47812 Spondylosis without myelopathy or radiculopathy, cervical region: Secondary | ICD-10-CM | POA: Diagnosis not present

## 2021-01-17 DIAGNOSIS — I10 Essential (primary) hypertension: Secondary | ICD-10-CM | POA: Diagnosis not present

## 2021-01-17 DIAGNOSIS — E785 Hyperlipidemia, unspecified: Secondary | ICD-10-CM | POA: Diagnosis not present

## 2021-01-17 DIAGNOSIS — J449 Chronic obstructive pulmonary disease, unspecified: Secondary | ICD-10-CM | POA: Diagnosis not present

## 2021-01-17 DIAGNOSIS — J441 Chronic obstructive pulmonary disease with (acute) exacerbation: Secondary | ICD-10-CM | POA: Diagnosis not present

## 2021-01-17 DIAGNOSIS — M159 Polyosteoarthritis, unspecified: Secondary | ICD-10-CM | POA: Diagnosis not present

## 2021-01-17 DIAGNOSIS — N401 Enlarged prostate with lower urinary tract symptoms: Secondary | ICD-10-CM | POA: Diagnosis not present

## 2021-01-24 ENCOUNTER — Telehealth: Payer: Self-pay | Admitting: Physical Medicine and Rehabilitation

## 2021-01-24 NOTE — Telephone Encounter (Signed)
Scheduled for OV. Last seen in 2020.

## 2021-01-24 NOTE — Telephone Encounter (Signed)
Patient called requesting a call back to set appt to see Dr. Ernestina Patches for lower back pain and leg pains. Please call patient at [phone].

## 2021-01-30 ENCOUNTER — Other Ambulatory Visit (INDEPENDENT_AMBULATORY_CARE_PROVIDER_SITE_OTHER): Payer: Self-pay | Admitting: Physical Medicine and Rehabilitation

## 2021-01-30 DIAGNOSIS — M25551 Pain in right hip: Secondary | ICD-10-CM

## 2021-01-30 NOTE — Telephone Encounter (Signed)
Please advise. Patient is scheduled for an OV on 4/19.

## 2021-02-12 ENCOUNTER — Encounter: Payer: Self-pay | Admitting: Physical Medicine and Rehabilitation

## 2021-02-12 ENCOUNTER — Other Ambulatory Visit: Payer: Self-pay

## 2021-02-12 ENCOUNTER — Ambulatory Visit (INDEPENDENT_AMBULATORY_CARE_PROVIDER_SITE_OTHER): Payer: Medicare Other | Admitting: Physical Medicine and Rehabilitation

## 2021-02-12 VITALS — BP 156/84 | HR 61

## 2021-02-12 DIAGNOSIS — M961 Postlaminectomy syndrome, not elsewhere classified: Secondary | ICD-10-CM | POA: Diagnosis not present

## 2021-02-12 DIAGNOSIS — G894 Chronic pain syndrome: Secondary | ICD-10-CM

## 2021-02-12 DIAGNOSIS — M5116 Intervertebral disc disorders with radiculopathy, lumbar region: Secondary | ICD-10-CM | POA: Diagnosis not present

## 2021-02-12 DIAGNOSIS — M5440 Lumbago with sciatica, unspecified side: Secondary | ICD-10-CM

## 2021-02-12 DIAGNOSIS — F1190 Opioid use, unspecified, uncomplicated: Secondary | ICD-10-CM

## 2021-02-12 MED ORDER — BACLOFEN 10 MG PO TABS
10.0000 mg | ORAL_TABLET | Freq: Three times a day (TID) | ORAL | 0 refills | Status: DC | PRN
Start: 2021-02-12 — End: 2021-02-27

## 2021-02-12 NOTE — Progress Notes (Signed)
Pt state lower back pain mostly the right side. Pt state walking, standing and getting out of bed in the morning makes the pain worse. Pt state he takes pain meds and uses heating pads to help ease his pain.  Numeric Pain Rating Scale and Functional Assessment Average Pain 8 Pain Right Now 7 My pain is constant and sharp Pain is worse with: walking, standing and some activites Pain improves with: heat/ice, pacing activities and medication   In the last MONTH (on 0-10 scale) has pain interfered with the following?  1. General activity like being  able to carry out your everyday physical activities such as walking, climbing stairs, carrying groceries, or moving a chair?  Rating(8)  2. Relation with others like being able to carry out your usual social activities and roles such as  activities at home, at work and in your community. Rating(8)  3. Enjoyment of life such that you have  been bothered by emotional problems such as feeling anxious, depressed or irritable?  Rating(8)

## 2021-02-14 ENCOUNTER — Encounter: Payer: Self-pay | Admitting: Physical Medicine and Rehabilitation

## 2021-02-14 NOTE — Progress Notes (Signed)
ELWIN TSOU - 75 y.o. male MRN [id number]  Date of birth: November 01, 1945  Office Visit Note: Visit Date: 02/12/2021 PCP: Hulan Fess, MD Referred by: Hulan Fess, MD  Subjective: Chief Complaint  Patient presents with  . Lower Back - Pain   HPI: Randall [name] is a 75 y.o. male who comes in today For evaluation and management of chronic worsening severe axial low back pain.  Patient is well-known to me and we have seen him over the years for his back pain.  His brief review of history is that he had back pain for many years and underwent remote lumbar laminectomy.  Was told that he probably needed to have a fusion performed but it had cervical fusion in the past and really did not want to undergo that with his lower back.  He has had multiple bouts of physical therapy and continues with home exercises including riding his stationary bike and trying to walk.  His biggest complaint is always been the fact that he could not stand and walk for very long distances and almost has a quality of neurogenic claudication but really no significant stenosis of the lumbar spine.  Has had pretty recent imaging and is reviewed below.  Mainly has degenerative disc height loss at multiple levels with facet arthropathy and foraminal narrowing but no high-grade central stenosis.  We actually underwent radiofrequency ablation at 1 point but was really not that beneficial for him.  He has been maintained chronically on a very low-dose of tramadol which I do provide for him.  He takes 2 tramadol in the morning and does quite well.  He reports over the last several months though worsening low back pain really in his normal area but just worsened.  He does report some increased physical activity recently and that may have exacerbated his symptoms and tweaked his back some.  He continues to try to stretch and walk and take medication like he normally does but this has been bothering him more lately.  He can have as high as  8 out of 10 pain for periods with walking and trying to stand and then he will get relief with rest and heat and ice.  His worst symptoms are getting out of bed in the morning right now.  He does use the heating pad quite a bit.  Has not noted any radicular complaints no focal weakness no red flag symptoms no fevers chills or night sweats or night pain no unintended weight loss.  Review of Systems  Musculoskeletal: Positive for back pain.  All other systems reviewed and are negative.  Otherwise per HPI.  Assessment & Plan: Visit Diagnoses:    ICD-10-CM   1. Radiculopathy due to lumbar intervertebral disc disorder  M51.16   2. Midline low back pain with sciatica, sciatica laterality unspecified, unspecified chronicity  M54.40   3. Post laminectomy syndrome  M96.1   4. Chronic pain syndrome  G89.4   5. Uncomplicated opioid use  H47.65      Plan: Findings:  Chronic recalcitrant low back pain mechanical and degenerative in nature with failure of conservative care including medication management now with long-term use of a mild amount of tramadol and failure prior laminectomies prior radiofrequency ablation with intermittent relief with epidural injections.  Patient seems to represent now a chronic situation recalcitrant to all care except he does managed to stay functional.  He has had worsening recently with flareup and pretty severe pain and on exam he does  have very tight musculature of the lower back pain with extension and he does ambulate with a forward flexed spine.  I think the next approach for him would be a simple epidural injection diagnostically to see if we can get this to calm down for him and get him back into his sort of routine of unfortunate chronic pain.  This would be a right L5-S1 interlaminar steroid injection.  We will continue with tramadol for him on a chronic basis its a very mild amount.  He shows no signs of abuse with that.  I am also going to provide muscle relaxer  baclofen which she has had in the past I will write a prescription for that.  We also talked about tight musculature and using a lacrosse ball for deep tissue massage that he can do on his own by laying in the floor but that pressure in that right lower quadrant quadratus lumborum.    Meds & Orders:  Meds ordered this encounter  Medications  . baclofen (LIORESAL) 10 MG tablet    Sig: Take 1 tablet (10 mg total) by mouth every 8 (eight) hours as needed for muscle spasms (Pain).    Dispense:  60 tablet    Refill:  0   No orders of the defined types were placed in this encounter.   Follow-up: Return for Right L5-S1 interlaminar steroid injection.   Procedures: No procedures performed      Clinical History: MRI LUMBAR SPINE WITHOUT CONTRAST  TECHNIQUE: Multiplanar, multisequence MR imaging of the lumbar spine was performed. No intravenous contrast was administered.  COMPARISON: Lumbar MRI 01/18/2016 and earlier.  FINDINGS: Segmentation: Normal lumbar segmentation on a 2015 CT lumbar myelogram which is the same numbering system used on the 2017 MRI.  Alignment: Stable vertebral height and alignment. Relatively preserved lumbar lordosis.  Vertebrae: No marrow edema or evidence of acute osseous abnormality. Visualized bone marrow signal is within normal limits. Intact visible sacrum and SI joints.  Conus medullaris and cauda equina: Conus extends to the T12 level. No lower spinal cord or conus signal abnormality.  Paraspinal and other soft tissues: Chronic polycystic kidney disease, more apparent on the left. 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  Negative visualized posterior paraspinal soft tissues.  Disc levels:  T11-T12: Mild facet hypertrophy.  T12-L1: Mild left facet hypertrophy.  L1-L2: Mild facet hypertrophy.  L2-L3: Minimal circumferential disc bulge and endplate  spurring. Mild to moderate facet hypertrophy. Chronic degenerative appearing right facet joint fluid. No spinal or lateral recess stenosis. Stable mild left L2 foraminal stenosis.  L3-L4: Chronic left eccentric disc bulge with broad-based left subarticular and foraminal component with annular fissure (series 7 image 27 and series 6, image 10). Superimposed moderate facet and ligament flavum hypertrophy with chronic facet joint fluid. Mild left lateral recess (left L4 nerve level), and moderate left L3 neural foraminal stenosis appears stable since 2017 without significant spinal stenosis. Mild contralateral right L3 foraminal stenosis is stable.  L4-L5: Chronic postoperative changes to the posterior elements of L4. Mild to moderate residual facet and ligament flavum hypertrophy is stable. Circumferential disc bulge with endplate spurring. Broad-based posterior and subarticular components. No significant spinal stenosis. Stable borderline to mild left lateral recess stenosis (left L5 nerve level). Stable mild to moderate bilateral L4 foraminal stenosis.  L5-S1: Stable and negative aside from epidural lipomatosis.  IMPRESSION: 1. Continued stable MRI appearance of the lumbar spine. No acute osseous abnormality.  2. 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 3. Chronic postoperative changes to the L4 posterior elements with mild residual lateral recess and up to moderate bilateral foraminal stenosis. 4. Chronic mild infrarenal abdominal aortic aneurysm now estimated at 32 mm diameter Recommend followup by Ultrasound in 3 years. This recommendation follows ACR consensus guidelines: White Paper of the ACR Incidental Findings Committee II on Vascular Findings. J Am Coll Radiol 2013; 10:789-794.   Electronically Signed By: Genevie Ann M.D. On: 06/21/2018 13:30 ------------------------------------ MRI of right  hip IMPRESSION: 1. No stress fracture or AVN. 2. Bilateral peritendinosis, right greater than left. No associated trochanteric bursitis. 3. Moderate right-sided hamstring tendinopathy with surrounding inflammation/bursitis and reactive marrow edema in the ischium. This could be the source of the patient's pain. Recommend clinical correlation. Mild hamstring tendinopathy on the left. 4. No significant intrapelvic abnormalities.   Electronically Signed By: Marijo Sanes M.D. On: 01/14/2017 09:06   He reports that he has quit smoking. He has a 40.00 pack-year smoking history. He has never used smokeless tobacco. No results for input(s): HGBA1C, LABURIC in the last 8760 hours.  Objective:  VS:  HT:    WT:   BMI:     BP:(!) 156/84  HR:61bpm  TEMP: ( )  RESP:  Physical Exam Vitals and nursing note reviewed.  Constitutional:      General: He is not in acute distress.    Appearance: Normal appearance. He is not ill-appearing.  HENT:     Head: Normocephalic and atraumatic.     Right Ear: External ear normal.     Left Ear: External ear normal.     Nose: No congestion.  Eyes:     Extraocular Movements: Extraocular movements intact.  Cardiovascular:     Rate and Rhythm: Normal rate.     Pulses: Normal pulses.  Pulmonary:     Effort: Pulmonary effort is normal. No respiratory distress.  Abdominal:     General: There is no distension.     Palpations: Abdomen is soft.  Musculoskeletal:        General: No tenderness or signs of injury.     Cervical back: Neck supple.     Right lower leg: No edema.     Left lower leg: No edema.     Comments: Patient has good distal strength without clonus.  He is slow to rise from a seated position to stand with a forward flexed lumbar spine.  He has focal tenderness and taut bands in the right more than left paraspinal region and quadratus lumborum this does reproduce some of his pain. Patient somewhat slow to rise from a seated position to full  extension.  There is concordant low back pain with facet loading and lumbar spine extension rotation.  There are no definitive trigger points but the patient is somewhat tender across the lower back and PSIS.  There is no pain with hip rotation.    Skin:    Findings: No erythema or rash.  Neurological:     General: No focal deficit present.     Mental Status: He is alert and oriented to person, place, and time.     Sensory: No sensory deficit.     Motor: No weakness or abnormal muscle tone.     Coordination: Coordination normal.  Psychiatric:        Mood and Affect: Mood normal.        Behavior: Behavior normal.  Ortho Exam  Imaging: No results found.  Past Medical/Family/Surgical/Social History: Medications & Allergies reviewed per EMR, new medications updated. Patient Active Problem List   Diagnosis Date Noted  . Uncomplicated opioid use 84/69/6295  . Post laminectomy syndrome 01/17/2017  . Ischial bursitis of right side 01/15/2017  . Pain in right hip 01/15/2017  . Chronic bilateral low back pain without sciatica 01/15/2017  . HNP (herniated nucleus pulposus), lumbar 05/15/2014  . S/P cervical spinal fusion 12/19/2011    Class: Diagnosis of  . 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 Kidney stones    1 removed by litho. 1 by Cysto  . RLS (restless legs syndrome)    Family History  Problem Relation Age of Onset  . Anesthesia problems Neg Hx    Past Surgical History:  Procedure Laterality Date  . Cervial  May 2012   4-5, 6-7  . LUMBAR LAMINECTOMY N/A 05/15/2014   Procedure: MICRODISCECTOMY LUMBAR LAMINECTOMY;  Surgeon: Marybelle Killings, MD;  Location: [facility];  Service: Orthopedics;  Laterality: N/A;  L3-4, L4-5 Decompression, Left L3-4, L4-5 Microdiscectomy  . POSTERIOR CERVICAL FUSION/FORAMINOTOMY  12/19/2011   Procedure: POSTERIOR CERVICAL FUSION/FORAMINOTOMY LEVEL 2;  Surgeon: Marybelle Killings,  MD;  Location: [facility];  Service: Orthopedics;  Laterality: N/A;  Posterior Cervical Fusion, C4-5, C6-7 Wiring, Vitoss, Iliac aspirate  . TONSILLECTOMY     Social History   Occupational History  . Not on file  Tobacco Use  . Smoking status: Former Smoker    Packs/day: 1.00    Years: 40.00    Pack years: 40.00  . Smokeless tobacco: Never Used  . Tobacco comment: Encouraged to remain smoke free.  Substance and Sexual Activity  . Alcohol use: Yes    Comment: occ  . Drug use: No  . Sexual activity: Not on file

## 2021-02-21 ENCOUNTER — Ambulatory Visit (INDEPENDENT_AMBULATORY_CARE_PROVIDER_SITE_OTHER): Payer: Medicare Other | Admitting: Physical Medicine and Rehabilitation

## 2021-02-21 ENCOUNTER — Other Ambulatory Visit: Payer: Self-pay

## 2021-02-21 ENCOUNTER — Ambulatory Visit: Payer: Self-pay

## 2021-02-21 ENCOUNTER — Encounter: Payer: Self-pay | Admitting: Physical Medicine and Rehabilitation

## 2021-02-21 VITALS — BP 127/74 | HR 67

## 2021-02-21 DIAGNOSIS — E785 Hyperlipidemia, unspecified: Secondary | ICD-10-CM | POA: Diagnosis not present

## 2021-02-21 DIAGNOSIS — M47812 Spondylosis without myelopathy or radiculopathy, cervical region: Secondary | ICD-10-CM | POA: Diagnosis not present

## 2021-02-21 DIAGNOSIS — I10 Essential (primary) hypertension: Secondary | ICD-10-CM | POA: Diagnosis not present

## 2021-02-21 DIAGNOSIS — M5116 Intervertebral disc disorders with radiculopathy, lumbar region: Secondary | ICD-10-CM

## 2021-02-21 DIAGNOSIS — J441 Chronic obstructive pulmonary disease with (acute) exacerbation: Secondary | ICD-10-CM | POA: Diagnosis not present

## 2021-02-21 DIAGNOSIS — J449 Chronic obstructive pulmonary disease, unspecified: Secondary | ICD-10-CM | POA: Diagnosis not present

## 2021-02-21 DIAGNOSIS — N401 Enlarged prostate with lower urinary tract symptoms: Secondary | ICD-10-CM | POA: Diagnosis not present

## 2021-02-21 DIAGNOSIS — M159 Polyosteoarthritis, unspecified: Secondary | ICD-10-CM | POA: Diagnosis not present

## 2021-02-21 MED ORDER — BETAMETHASONE SOD PHOS & ACET 6 (3-3) MG/ML IJ SUSP
12.0000 mg | Freq: Once | INTRAMUSCULAR | Status: AC
  Administered 2021-02-21: 12 mg

## 2021-02-21 NOTE — Patient Instructions (Signed)

## 2021-02-21 NOTE — Progress Notes (Signed)
Pt state lower back pain the travels down to his right calf. Pt state walking makes the pain worse. Pt state he take pain meds to help ease his pain.  Numeric Pain Rating Scale and Functional Assessment Average Pain 7   In the last MONTH (on 0-10 scale) has pain interfered with the following?  1. General activity like being  able to carry out your everyday physical activities such as walking, climbing stairs, carrying groceries, or moving a chair?  Rating(8)   +Driver, -BT, -Dye Allergies.

## 2021-02-25 NOTE — Progress Notes (Signed)
[name] - 75 y.o. male MRN [id number]  Date of birth: 1946-08-30  Office Visit Note: Visit Date: 02/21/2021 PCP: Hulan Fess, MD Referred by: Hulan Fess, MD  Subjective: Chief Complaint  Patient presents with  . Lower Back - Pain   HPI:  Randall [name] is a 75 y.o. male who comes in today at the request of Dr. Laurence Spates for planned Right L5-S1 Lumbar epidural steroid injection with fluoroscopic guidance.  The patient has failed conservative care including home exercise, medications, time and activity modification.  This injection will be diagnostic and hopefully therapeutic.  Please see requesting physician notes for further details and justification.  Since I last saw the patient he did try some baclofen that he has tried in the past and I did write a new prescription.  Unfortunately it made him urinate more frequently to the point where he was really waking up at night.  He does have some bladder and prostate issues.  We discussed this and it may be that because this is an antispasmodic that it was counteracting some of that medication.  He will discontinue the use of baclofen at this point.  ROS Otherwise per HPI.  Assessment & Plan: Visit Diagnoses:    ICD-10-CM   1. Radiculopathy due to lumbar intervertebral disc disorder  M51.16 XR C-ARM NO REPORT    Epidural Steroid injection    betamethasone acetate-betamethasone sodium phosphate (CELESTONE) injection 12 mg    Plan: No additional findings.   Meds & Orders:  Meds ordered this encounter  Medications  . betamethasone acetate-betamethasone sodium phosphate (CELESTONE) injection 12 mg    Orders Placed This Encounter  Procedures  . XR C-ARM NO REPORT  . Epidural Steroid injection    Follow-up: Return if symptoms worsen or fail to improve.   Procedures: No procedures performed  Lumbar Epidural Steroid Injection - Interlaminar Approach with Fluoroscopic Guidance  Patient: Randall [name]      Date of  Birth: [birth year] MRN: [id number] PCP: Hulan Fess, MD      Visit Date: 02/21/2021   Universal Protocol:     Consent Given By: the patient  Position: PRONE  Additional Comments: Vital signs were monitored before and after the procedure. Patient was prepped and draped in the usual sterile fashion. The correct patient, procedure, and site was verified.   Injection Procedure Details:   Procedure diagnoses: Radiculopathy due to lumbar intervertebral disc disorder [M51.16]   Meds Administered:  Meds ordered this encounter  Medications  . betamethasone acetate-betamethasone sodium phosphate (CELESTONE) injection 12 mg     Laterality: Right  Location/Site:  L5-S1  Needle: 3.5 in., 20 ga. Tuohy  Needle Placement: Paramedian epidural  Findings:   -Comments: Excellent flow of contrast into the epidural space.  Procedure Details: Using a paramedian approach from the side mentioned above, the region overlying the inferior lamina was localized under fluoroscopic visualization and the soft tissues overlying this structure were infiltrated with 4 ml. of 1% Lidocaine without Epinephrine. The Tuohy needle was inserted into the epidural space using a paramedian approach.   The epidural space was localized using loss of resistance along with counter oblique bi-planar fluoroscopic views.  After negative aspirate for air, blood, and CSF, a 2 ml. volume of Isovue-250 was injected into the epidural space and the flow of contrast was observed. Radiographs were obtained for documentation purposes.    The injectate was administered into the level noted above.   Additional Comments:  The patient tolerated  the procedure well Dressing: 2 x 2 sterile gauze and Band-Aid    Post-procedure details: Patient was observed during the procedure. Post-procedure instructions were reviewed.  Patient left the clinic in stable condition.     Clinical History: MRI LUMBAR SPINE WITHOUT  CONTRAST  TECHNIQUE: Multiplanar, multisequence MR imaging of the lumbar spine was performed. No intravenous contrast was administered.  COMPARISON: Lumbar MRI 01/18/2016 and earlier.  FINDINGS: Segmentation: Normal lumbar segmentation on a 2015 CT lumbar myelogram which is the same numbering system used on the 2017 MRI.  Alignment: Stable vertebral height and alignment. Relatively preserved lumbar lordosis.  Vertebrae: No marrow edema or evidence of acute osseous abnormality. Visualized bone marrow signal is within normal limits. Intact visible sacrum and SI joints.  Conus medullaris and cauda equina: Conus extends to the T12 level. No lower spinal cord or conus signal abnormality.  Paraspinal and other soft tissues: Chronic polycystic kidney disease, more apparent on the left. 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  Negative visualized posterior paraspinal soft tissues.  Disc levels:  T11-T12: Mild facet hypertrophy.  T12-L1: Mild left facet hypertrophy.  L1-L2: Mild facet hypertrophy.  L2-L3: Minimal circumferential disc bulge and endplate spurring. Mild to moderate facet hypertrophy. Chronic degenerative appearing right facet joint fluid. No spinal or lateral recess stenosis. Stable mild left L2 foraminal stenosis.  L3-L4: Chronic left eccentric disc bulge with broad-based left subarticular and foraminal component with annular fissure (series 7 image 27 and series 6, image 10). Superimposed moderate facet and ligament flavum hypertrophy with chronic facet joint fluid. Mild left lateral recess (left L4 nerve level), and moderate left L3 neural foraminal stenosis appears stable since 2017 without significant spinal stenosis. Mild contralateral right L3 foraminal stenosis is stable.  L4-L5: Chronic postoperative changes to the posterior elements of L4. Mild to moderate  residual facet and ligament flavum hypertrophy is stable. Circumferential disc bulge with endplate spurring. Broad-based posterior and subarticular components. No significant spinal stenosis. Stable borderline to mild left lateral recess stenosis (left L5 nerve level). Stable mild to moderate bilateral L4 foraminal stenosis.  L5-S1: Stable and negative aside from epidural lipomatosis.  IMPRESSION: 1. Continued stable MRI appearance of the lumbar spine. No acute osseous abnormality. 2. 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 3. Chronic postoperative changes to the L4 posterior elements with mild residual lateral recess and up to moderate bilateral foraminal stenosis. 4. Chronic mild infrarenal abdominal aortic aneurysm now estimated at 32 mm diameter Recommend followup by Ultrasound in 3 years. This recommendation follows ACR consensus guidelines: White Paper of the ACR Incidental Findings Committee II on Vascular Findings. J Am Coll Radiol 2013; 10:789-794.   Electronically Signed By: Genevie Ann M.D. On: 06/21/2018 13:30 ------------------------------------ MRI of right hip IMPRESSION: 1. No stress fracture or AVN. 2. Bilateral peritendinosis, right greater than left. No associated trochanteric bursitis. 3. Moderate right-sided hamstring tendinopathy with surrounding inflammation/bursitis and reactive marrow edema in the ischium. This could be the source of the patient's pain. Recommend clinical correlation. Mild hamstring tendinopathy on the left. 4. No significant intrapelvic abnormalities.   Electronically Signed By: Marijo Sanes M.D. On: 01/14/2017 09:06     Objective:  VS:  HT:    WT:   BMI:     BP:127/74  HR:67bpm  TEMP: ( )  RESP:  Physical Exam Vitals and nursing note reviewed.  Constitutional:  General: He is not in acute distress.    Appearance: Normal appearance. He is not  ill-appearing.  HENT:     Head: Normocephalic and atraumatic.     Right Ear: External ear normal.     Left Ear: External ear normal.     Nose: No congestion.  Eyes:     Extraocular Movements: Extraocular movements intact.  Cardiovascular:     Rate and Rhythm: Normal rate.     Pulses: Normal pulses.  Pulmonary:     Effort: Pulmonary effort is normal. No respiratory distress.  Abdominal:     General: There is no distension.     Palpations: Abdomen is soft.  Musculoskeletal:        General: No tenderness or signs of injury.     Cervical back: Neck supple.     Right lower leg: No edema.     Left lower leg: No edema.     Comments: Patient has good distal strength without clonus.  Skin:    Findings: No erythema or rash.  Neurological:     General: No focal deficit present.     Mental Status: He is alert and oriented to person, place, and time.     Sensory: No sensory deficit.     Motor: No weakness or abnormal muscle tone.     Coordination: Coordination normal.  Psychiatric:        Mood and Affect: Mood normal.        Behavior: Behavior normal.      Imaging: No results found.

## 2021-02-25 NOTE — Procedures (Signed)
Lumbar Epidural Steroid Injection - Interlaminar Approach with Fluoroscopic Guidance  Patient: Randall [name]      Date of Birth: 02-21-1946 MRN: [id number] PCP: Hulan Fess, MD      Visit Date: 02/21/2021   Universal Protocol:     Consent Given By: the patient  Position: PRONE  Additional Comments: Vital signs were monitored before and after the procedure. Patient was prepped and draped in the usual sterile fashion. The correct patient, procedure, and site was verified.   Injection Procedure Details:   Procedure diagnoses: Radiculopathy due to lumbar intervertebral disc disorder [M51.16]   Meds Administered:  Meds ordered this encounter  Medications  . betamethasone acetate-betamethasone sodium phosphate (CELESTONE) injection 12 mg     Laterality: Right  Location/Site:  L5-S1  Needle: 3.5 in., 20 ga. Tuohy  Needle Placement: Paramedian epidural  Findings:   -Comments: Excellent flow of contrast into the epidural space.  Procedure Details: Using a paramedian approach from the side mentioned above, the region overlying the inferior lamina was localized under fluoroscopic visualization and the soft tissues overlying this structure were infiltrated with 4 ml. of 1% Lidocaine without Epinephrine. The Tuohy needle was inserted into the epidural space using a paramedian approach.   The epidural space was localized using loss of resistance along with counter oblique bi-planar fluoroscopic views.  After negative aspirate for air, blood, and CSF, a 2 ml. volume of Isovue-250 was injected into the epidural space and the flow of contrast was observed. Radiographs were obtained for documentation purposes.    The injectate was administered into the level noted above.   Additional Comments:  The patient tolerated the procedure well Dressing: 2 x 2 sterile gauze and Band-Aid    Post-procedure details: Patient was observed during the procedure. Post-procedure instructions were  reviewed.  Patient left the clinic in stable condition.

## 2021-02-27 ENCOUNTER — Telehealth: Payer: Self-pay | Admitting: Physical Medicine and Rehabilitation

## 2021-02-27 ENCOUNTER — Other Ambulatory Visit: Payer: Self-pay | Admitting: Physical Medicine and Rehabilitation

## 2021-02-27 DIAGNOSIS — M5136 Other intervertebral disc degeneration, lumbar region: Secondary | ICD-10-CM

## 2021-02-27 MED ORDER — PREDNISONE 50 MG PO TABS: ORAL_TABLET | ORAL | 0 refills | Status: DC

## 2021-02-27 MED ORDER — CYCLOBENZAPRINE HCL 10 MG PO TABS: 10.0000 mg | ORAL_TABLET | Freq: Three times a day (TID) | ORAL | 0 refills | Status: DC | PRN

## 2021-02-27 NOTE — Progress Notes (Signed)
Prednsione 5 day course, flexeril and MRI for severe 10/10 back pain after lifting injury.

## 2021-02-27 NOTE — Telephone Encounter (Signed)
Try to get him as OV. Try toget details of what he means by hurt his back again. 1) Did last injection help much 2) is this something new since I last saw him?

## 2021-02-27 NOTE — Telephone Encounter (Signed)
Pt last inj was on 02/21/21 Right L5-S1 [place], Please Advise

## 2021-02-27 NOTE — Telephone Encounter (Signed)
Called pt and informed him about his Rx, pt mention his tramdol runs out on 5/6, please advise.

## 2021-02-27 NOTE — Telephone Encounter (Signed)
Pt mention he moved some office chairs for his wife and now he cant get up or down. Pt mention his pain is at a ten.

## 2021-02-27 NOTE — Telephone Encounter (Signed)
Patient called needing to schedule another appointment with Dr Ernestina Patches because he hurt his back again  2 days ago. The number to contact patient is [phone]

## 2021-02-27 NOTE — Telephone Encounter (Signed)
I ordered a prednisone course for 5 days which is 50 mg once a day as well as Flexeril as a muscle relaxer.  This should not given the same effect that the baclofen did with urinary frequency.  He can continue to take the tramadol.  He should use heat and ice and gentle stretching.  I did order an MRI of his lumbar spine to do the severity of his symptoms and we had the recent injection etc.  Once we get the MRI we will go from there.  If he has any symptoms such as focal weakness of the lower legs or bowel or bladder changes etc. he really just needs to go to the emergency department.

## 2021-02-28 ENCOUNTER — Other Ambulatory Visit: Payer: Self-pay | Admitting: Physical Medicine and Rehabilitation

## 2021-02-28 DIAGNOSIS — M25551 Pain in right hip: Secondary | ICD-10-CM

## 2021-02-28 MED ORDER — TRAMADOL HCL 50 MG PO TABS: 50.0000 mg | ORAL_TABLET | Freq: Two times a day (BID) | ORAL | 0 refills | Status: DC | PRN

## 2021-02-28 NOTE — Progress Notes (Signed)
Refill Tramadol 

## 2021-02-28 NOTE — Telephone Encounter (Signed)
Refill done.  

## 2021-03-06 ENCOUNTER — Other Ambulatory Visit: Payer: Self-pay | Admitting: Physical Medicine and Rehabilitation

## 2021-03-06 DIAGNOSIS — M5136 Other intervertebral disc degeneration, lumbar region: Secondary | ICD-10-CM

## 2021-03-08 DIAGNOSIS — J441 Chronic obstructive pulmonary disease with (acute) exacerbation: Secondary | ICD-10-CM | POA: Diagnosis not present

## 2021-03-08 DIAGNOSIS — J449 Chronic obstructive pulmonary disease, unspecified: Secondary | ICD-10-CM | POA: Diagnosis not present

## 2021-03-08 DIAGNOSIS — M47812 Spondylosis without myelopathy or radiculopathy, cervical region: Secondary | ICD-10-CM | POA: Diagnosis not present

## 2021-03-08 DIAGNOSIS — E785 Hyperlipidemia, unspecified: Secondary | ICD-10-CM | POA: Diagnosis not present

## 2021-03-08 DIAGNOSIS — N401 Enlarged prostate with lower urinary tract symptoms: Secondary | ICD-10-CM | POA: Diagnosis not present

## 2021-03-08 DIAGNOSIS — M159 Polyosteoarthritis, unspecified: Secondary | ICD-10-CM | POA: Diagnosis not present

## 2021-03-08 DIAGNOSIS — I10 Essential (primary) hypertension: Secondary | ICD-10-CM | POA: Diagnosis not present

## 2021-03-16 ENCOUNTER — Ambulatory Visit
Admission: RE | Admit: 2021-03-16 | Discharge: 2021-03-16 | Disposition: A | Discharge status: Home / Self Care | Payer: Medicare Other | Source: Ambulatory Visit | Attending: Physical Medicine and Rehabilitation | Admitting: Physical Medicine and Rehabilitation

## 2021-03-16 DIAGNOSIS — M545 Low back pain, unspecified: Secondary | ICD-10-CM | POA: Diagnosis not present

## 2021-03-16 DIAGNOSIS — M48061 Spinal stenosis, lumbar region without neurogenic claudication: Secondary | ICD-10-CM | POA: Diagnosis not present

## 2021-03-16 DIAGNOSIS — M5136 Other intervertebral disc degeneration, lumbar region: Secondary | ICD-10-CM

## 2021-03-19 ENCOUNTER — Telehealth: Payer: Self-pay | Admitting: Physical Medicine and Rehabilitation

## 2021-03-19 NOTE — Telephone Encounter (Signed)
-----   Message from Magnus Sinning, MD sent at 03/18/2021  2:56 PM EDT ----- Regarding: MRI not much has changged from last If still hurting could try tf esi at L3 or L4 based on MRI if "ok" just know not much different from prior MRI

## 2021-03-19 NOTE — Telephone Encounter (Signed)
Patient reports that he is still hurting. He states that last injection helped but only for a short time. Scheduled for 5/26 with driver.

## 2021-03-21 ENCOUNTER — Ambulatory Visit (INDEPENDENT_AMBULATORY_CARE_PROVIDER_SITE_OTHER): Payer: Medicare Other | Admitting: Physical Medicine and Rehabilitation

## 2021-03-21 ENCOUNTER — Ambulatory Visit: Payer: Self-pay

## 2021-03-21 ENCOUNTER — Other Ambulatory Visit: Payer: Self-pay

## 2021-03-21 ENCOUNTER — Encounter: Payer: Self-pay | Admitting: Physical Medicine and Rehabilitation

## 2021-03-21 VITALS — BP 122/77 | HR 74

## 2021-03-21 DIAGNOSIS — R269 Unspecified abnormalities of gait and mobility: Secondary | ICD-10-CM

## 2021-03-21 DIAGNOSIS — M960 Pseudarthrosis after fusion or arthrodesis: Secondary | ICD-10-CM

## 2021-03-21 DIAGNOSIS — M5116 Intervertebral disc disorders with radiculopathy, lumbar region: Secondary | ICD-10-CM

## 2021-03-21 DIAGNOSIS — M961 Postlaminectomy syndrome, not elsewhere classified: Secondary | ICD-10-CM

## 2021-03-21 MED ORDER — BETAMETHASONE SOD PHOS & ACET 6 (3-3) MG/ML IJ SUSP
12.0000 mg | Freq: Once | INTRAMUSCULAR | Status: AC
  Administered 2021-03-21: 12 mg

## 2021-03-21 NOTE — Progress Notes (Signed)
Pt state lower back pain that travels down both legs.Pt state walking, standing and laying down makes the pain worse. Pt state he take pain meds to help ease his pain. Pt has hx of inj on 02/21/21 pt state It helped for a week and half.  Numeric Pain Rating Scale and Functional Assessment Average Pain 5   In the last MONTH (on 0-10 scale) has pain interfered with the following?  1. General activity like being  able to carry out your everyday physical activities such as walking, climbing stairs, carrying groceries, or moving a chair?  Rating(8)   +Driver, -BT, -Dye Allergies.

## 2021-03-21 NOTE — Patient Instructions (Signed)

## 2021-03-22 ENCOUNTER — Encounter: Payer: Self-pay | Admitting: Physical Medicine and Rehabilitation

## 2021-03-22 NOTE — Procedures (Signed)
Lumbar Epidural Steroid Injection - Interlaminar Approach with Fluoroscopic Guidance  Patient: Randall [name]      Date of Birth: 11/01/1945 MRN: [id number] PCP: Hulan Fess, MD      Visit Date: 03/21/2021   Universal Protocol:     Consent Given By: the patient  Position: PRONE  Additional Comments: Vital signs were monitored before and after the procedure. Patient was prepped and draped in the usual sterile fashion. The correct patient, procedure, and site was verified.   Injection Procedure Details:   Procedure diagnoses: Radiculopathy due to lumbar intervertebral disc disorder [M51.16]   Meds Administered:  Meds ordered this encounter  Medications  . betamethasone acetate-betamethasone sodium phosphate (CELESTONE) injection 12 mg     Laterality: Right  Location/Site:  L2-L3  Needle: 3.5 in., 20 ga. Tuohy  Needle Placement: Paramedian epidural  Findings:   -Comments: Excellent flow of contrast into the epidural space.  Procedure Details: Using a paramedian approach from the side mentioned above, the region overlying the inferior lamina was localized under fluoroscopic visualization and the soft tissues overlying this structure were infiltrated with 4 ml. of 1% Lidocaine without Epinephrine. The Tuohy needle was inserted into the epidural space using a paramedian approach.   The epidural space was localized using loss of resistance along with counter oblique bi-planar fluoroscopic views.  After negative aspirate for air, blood, and CSF, a 2 ml. volume of Isovue-250 was injected into the epidural space and the flow of contrast was observed. Radiographs were obtained for documentation purposes.    The injectate was administered into the level noted above.   Additional Comments:  The patient tolerated the procedure well Dressing: 2 x 2 sterile gauze and Band-Aid    Post-procedure details: Patient was observed during the procedure. Post-procedure instructions were  reviewed.  Patient left the clinic in stable condition.

## 2021-03-22 NOTE — Progress Notes (Signed)
Randall [name] - 75 y.o. male MRN [id number]  Date of birth: 11-Jan-1946  Office Visit Note: Visit Date: 03/21/2021 PCP: Hulan Fess, MD Referred by: Hulan Fess, MD  Subjective: Chief Complaint  Patient presents with  . Lower Back - Pain  . Left Leg - Pain  . Right Leg - Pain   HPI: Randall [name] is a 75 y.o. male who comes in today For evaluation management chronic worsening right-sided more than left back pain some referral into the hip and buttock status post MRI update.  MRI was completed of the lumbar spine as it has been a few years since we did this and he was having worsening symptoms traveling into the buttock and thigh some more radicular than he had in the past.  His biggest complaint is axial low back pain pretty much at the L4 level some referral into the hips and buttock but when he talks about leg pain its more difficulty with ambulating.  He reports a feeling of weakness in the legs but he also feels like his knees hyperextend at times when he is walking.  Watching him walk today he does walk with a short based gait no hip hiking or foot slapping.  I do not see any genu recurvatum today.  It does sound like though that is what he is having with his knees want to go backwards.  MRI reviewed today at least of the lumbar spine would not have any reason to have any weakness at the feet or legs that would cause that.  He does have some progression at L3-4 of the left-sided disc protrusion and ongoing facet arthropathy at L3-4 but still has good decompression of the lower lumbar region without stenosis.  No focal disc herniations and compression.  In the past radiofrequency ablation of the lower lumbar joints was not very beneficial but I do not think we included L3-4 which could be a source of pain and we discussed this.  His wife is present today provides some of the history.  Epidural injection did seem to help for about a week and it was really beneficial but he does endorse some  heavy lifting and moving that seems to have some flared up the back once again.  We discussed home exercises with him.  Secondarily he has had a cervical fusion by Dr. Leana Gamer.  There is seem to have been a pseudoarthrosis of that according to the notes.  He has not had recent imaging of the cervical spine.  He denies any symptoms in the hands.  Review of Systems  Musculoskeletal: Positive for back pain and joint pain.  Neurological: Positive for weakness.  All other systems reviewed and are negative.  Otherwise per HPI.  Assessment & Plan: Visit Diagnoses:    ICD-10-CM   1. Radiculopathy due to lumbar intervertebral disc disorder  M51.16 XR C-ARM NO REPORT    Epidural Steroid injection    betamethasone acetate-betamethasone sodium phosphate (CELESTONE) injection 12 mg  2. Post laminectomy syndrome  M96.1   3. 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 Gait abnormality  R26.9      Plan: Findings:  1.  Chronic worsening right-sided low back pain seems to be a combination of discogenic pain and facet arthropathy although in the past radiofrequency ablation was not as beneficial as we had hoped.  At this point I think repeating epidural injection is worthwhile since he did get diagnostic relief and he seemed to have just  had a flareup of symptoms potentially after that.  I am going to complete the interlaminar injection above L3-4 above his laminectomy and just see if that does better than the lower injection.  Depending on relief would look at diagnostic medial branch blocks at L3-4 which is where most of his arthritis is.  We will review the prior radiofrequency ablation of the remaining years ago and it was on her old system.  He will continue with home exercises and strengthening and staying active.  MRI updated does not show much in the way of significant progression.  2.  Gait abnormality with possible genu rec of Randall [name] on ambulation.  Typically that type of finding is a  neurologic issue sometimes weakness at the ankle or change in gait pattern.  He has no signs of other movement disorder.  No history of Parkinson's etc.  He did have cervical spine surgery with pseudoarthrosis.  He reported that the epidural injection seem to have helped his gait and so at this point organ to monitor what happens after the injection to see if his gait still has a problem.  I might at that point either have him follow-up with physical therapy for a few sessions to analyze this gait versus referral back to Dr. Leana Gamer for his cervical spine to see if that is any source of what is going on here and some type of myelopathy.  No clonus on exam.    Meds & Orders:  Meds ordered this encounter  Medications  . betamethasone acetate-betamethasone sodium phosphate (CELESTONE) injection 12 mg    Orders Placed This Encounter  Procedures  . XR C-ARM NO REPORT  . Epidural Steroid injection    Follow-up: Return if symptoms worsen or fail to improve.   Procedures: No procedures performed  Lumbar Epidural Steroid Injection - Interlaminar Approach with Fluoroscopic Guidance  Patient: Randall [name]      Date of Birth: June 12, 1946 MRN: [id number] PCP: Hulan Fess, MD      Visit Date: 03/21/2021   Universal Protocol:     Consent Given By: the patient  Position: PRONE  Additional Comments: Vital signs were monitored before and after the procedure. Patient was prepped and draped in the usual sterile fashion. The correct patient, procedure, and site was verified.   Injection Procedure Details:   Procedure diagnoses: Radiculopathy due to lumbar intervertebral disc disorder [M51.16]   Meds Administered:  Meds ordered this encounter  Medications  . betamethasone acetate-betamethasone sodium phosphate (CELESTONE) injection 12 mg     Laterality: Right  Location/Site:  L2-L3  Needle: 3.5 in., 20 ga. Tuohy  Needle Placement: Paramedian epidural  Findings:   -Comments:  Excellent flow of contrast into the epidural space.  Procedure Details: Using a paramedian approach from the side mentioned above, the region overlying the inferior lamina was localized under fluoroscopic visualization and the soft tissues overlying this structure were infiltrated with 4 ml. of 1% Lidocaine without Epinephrine. The Tuohy needle was inserted into the epidural space using a paramedian approach.   The epidural space was localized using loss of resistance along with counter oblique bi-planar fluoroscopic views.  After negative aspirate for air, blood, and CSF, a 2 ml. volume of Isovue-250 was injected into the epidural space and the flow of contrast was observed. Radiographs were obtained for documentation purposes.    The injectate was administered into the level noted above.   Additional Comments:  The patient tolerated the procedure well Dressing: 2 x 2 sterile  gauze and Band-Aid    Post-procedure details: Patient was observed during the procedure. Post-procedure instructions were reviewed.  Patient left the clinic in stable condition.     Clinical History: MRI LUMBAR SPINE WITHOUT CONTRAST  TECHNIQUE: Multiplanar, multisequence MR imaging of the lumbar spine was performed. No intravenous contrast was administered.  COMPARISON: Lumbar MRI 01/18/2016 and earlier.  FINDINGS: Segmentation: Normal lumbar segmentation on a 2015 CT lumbar myelogram which is the same numbering system used on the 2017 MRI.  Alignment: Stable vertebral height and alignment. Relatively preserved lumbar lordosis.  Vertebrae: No marrow edema or evidence of acute osseous abnormality. Visualized bone marrow signal is within normal limits. Intact visible sacrum and SI joints.  Conus medullaris and cauda equina: Conus extends to the T12 level. No lower spinal cord or conus signal abnormality.  Paraspinal and other soft tissues: Chronic polycystic kidney disease, more apparent on  the left. 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  Negative visualized posterior paraspinal soft tissues.  Disc levels:  T11-T12: Mild facet hypertrophy.  T12-L1: Mild left facet hypertrophy.  L1-L2: Mild facet hypertrophy.  L2-L3: Minimal circumferential disc bulge and endplate spurring. Mild to moderate facet hypertrophy. Chronic degenerative appearing right facet joint fluid. No spinal or lateral recess stenosis. Stable mild left L2 foraminal stenosis.  L3-L4: Chronic left eccentric disc bulge with broad-based left subarticular and foraminal component with annular fissure (series 7 image 27 and series 6, image 10). Superimposed moderate facet and ligament flavum hypertrophy with chronic facet joint fluid. Mild left lateral recess (left L4 nerve level), and moderate left L3 neural foraminal stenosis appears stable since 2017 without significant spinal stenosis. Mild contralateral right L3 foraminal stenosis is stable.  L4-L5: Chronic postoperative changes to the posterior elements of L4. Mild to moderate residual facet and ligament flavum hypertrophy is stable. Circumferential disc bulge with endplate spurring. Broad-based posterior and subarticular components. No significant spinal stenosis. Stable borderline to mild left lateral recess stenosis (left L5 nerve level). Stable mild to moderate bilateral L4 foraminal stenosis.  L5-S1: Stable and negative aside from epidural lipomatosis.  IMPRESSION: 1. Continued stable MRI appearance of the lumbar spine. No acute osseous abnormality. 2. 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 3. Chronic postoperative changes to the L4 posterior elements with mild residual lateral recess and up to moderate bilateral foraminal stenosis. 4. 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 This recommendation follows ACR consensus guidelines: White Paper of the ACR Incidental Findings Committee II on Vascular Findings. J Am Coll Radiol 2013; 10:789-794.   Electronically Signed By: Genevie Ann M.D. On: 06/21/2018 13:30 ------------------------------------ MRI of right hip IMPRESSION: 1. No stress fracture or AVN. 2. Bilateral peritendinosis, right greater than left. No associated trochanteric bursitis. 3. Moderate right-sided hamstring tendinopathy with surrounding inflammation/bursitis and reactive marrow edema in the ischium. This could be the source of the patient's pain. Recommend clinical correlation. Mild hamstring tendinopathy on the left. 4. No significant intrapelvic abnormalities.   Electronically Signed By: Marijo Sanes M.D. On: 01/14/2017 09:06   He reports that he has quit smoking. He has a 40.00 pack-year smoking history. He has never used smokeless tobacco. No results for input(s): HGBA1C, LABURIC in the last 8760 hours.  Objective:  VS:  HT:    WT:   BMI:     BP:122/77  HR:74bpm  TEMP: ( )  RESP:  Physical Exam Vitals and nursing note reviewed.  Constitutional:      General: He is not in acute distress.    Appearance: Normal appearance. He is not ill-appearing.  HENT:     Head: Normocephalic and atraumatic.     Right Ear: External ear normal.     Left Ear: External ear normal.     Nose: No congestion.  Eyes:     Extraocular Movements: Extraocular movements intact.  Cardiovascular:     Rate and Rhythm: Normal rate.     Pulses: Normal pulses.  Pulmonary:     Effort: Pulmonary effort is normal. No respiratory distress.  Abdominal:     General: There is no distension.     Palpations: Abdomen is soft.  Musculoskeletal:        General: No tenderness or signs of injury.     Cervical back: Neck supple.     Right lower leg: No edema.     Left lower leg:  No edema.     Comments: Patient has good distal strength without clonus.  He has good strength at hip flexion and knee flexion and extension.  He does ambulate with a forward flexed lumbar spine with a somewhat of a shuffled wide-based gait.  He has no varus or valgus instability of the knees.  He does have pain with facet loading of the lumbar spine.  Skin:    Findings: No erythema or rash.  Neurological:     General: No focal deficit present.     Mental Status: He is alert and oriented to person, place, and time.     Sensory: No sensory deficit.     Motor: No weakness or abnormal muscle tone.     Coordination: Coordination normal.     Gait: Gait normal.  Psychiatric:        Mood and Affect: Mood normal.        Behavior: Behavior normal.     Ortho Exam  Imaging: XR C-ARM NO REPORT  Result Date: 03/21/2021 Please see Notes tab for imaging impression.   Past Medical/Family/Surgical/Social History: Medications & Allergies reviewed per EMR, new medications updated. Patient Active Problem List   Diagnosis Date Noted  . Uncomplicated opioid use 85/27/7824  . Post laminectomy syndrome 01/17/2017  . Ischial bursitis of right side 01/15/2017  . Pain in right hip 01/15/2017  . Chronic bilateral low back pain without sciatica 01/15/2017  . HNP (herniated nucleus pulposus), lumbar 05/15/2014  . S/P cervical spinal fusion 12/19/2011    Class: Diagnosis of  . 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 Kidney stones    1 removed by litho. 1 by Cysto  . RLS (restless legs syndrome)    Family History  Problem Relation Age of Onset  . Anesthesia problems Neg Hx    Past Surgical History:  Procedure Laterality Date  . Cervial  May 2012   4-5, 6-7  . LUMBAR LAMINECTOMY N/A 05/15/2014   Procedure: MICRODISCECTOMY LUMBAR LAMINECTOMY;  Surgeon: Marybelle Killings, MD;  Location: [facility];  Service: Orthopedics;  Laterality:  N/A;  L3-4, L4-5 Decompression, Left L3-4, L4-5 Microdiscectomy  . POSTERIOR CERVICAL FUSION/FORAMINOTOMY  12/19/2011   Procedure: POSTERIOR CERVICAL FUSION/FORAMINOTOMY LEVEL 2;  Surgeon: Marybelle Killings, MD;  Location: [facility];  Service: Orthopedics;  Laterality: N/A;  Posterior Cervical Fusion, C4-5, C6-7 Wiring, Vitoss, Iliac aspirate  .  TONSILLECTOMY     Social History   Occupational History  . Not on file  Tobacco Use  . Smoking status: Former Smoker    Packs/day: 1.00    Years: 40.00    Pack years: 40.00  . Smokeless tobacco: Never Used  . Tobacco comment: Encouraged to remain smoke free.  Substance and Sexual Activity  . Alcohol use: Yes    Comment: occ  . Drug use: No  . Sexual activity: Not on file

## 2021-03-29 ENCOUNTER — Other Ambulatory Visit: Payer: Self-pay | Admitting: Physical Medicine and Rehabilitation

## 2021-03-29 DIAGNOSIS — M25551 Pain in right hip: Secondary | ICD-10-CM

## 2021-03-29 NOTE — Telephone Encounter (Signed)
Please advise 

## 2021-04-05 ENCOUNTER — Telehealth: Payer: Self-pay | Admitting: Physical Medicine and Rehabilitation

## 2021-04-05 NOTE — Telephone Encounter (Signed)
Right L2-3 IL on 5/26. Please advise.

## 2021-04-05 NOTE — Telephone Encounter (Signed)
Patient returned call asked for a call back    The number to contact  patient is [phone]

## 2021-04-05 NOTE — Telephone Encounter (Signed)
Pt called stating he got a back inj a couple weeks ago and it hasn't done him any good; so he would like a CB to discuss setting an appt.   [phone]

## 2021-04-05 NOTE — Telephone Encounter (Signed)
Left message #1

## 2021-04-08 ENCOUNTER — Telehealth: Payer: Self-pay

## 2021-04-08 ENCOUNTER — Telehealth: Payer: Self-pay | Admitting: Physical Medicine and Rehabilitation

## 2021-04-08 NOTE — Telephone Encounter (Signed)
See previous message

## 2021-04-08 NOTE — Telephone Encounter (Signed)
Pt returned call. He will be waiting by phone.

## 2021-04-08 NOTE — Telephone Encounter (Signed)
Left message #3

## 2021-04-08 NOTE — Telephone Encounter (Signed)
Left message #2

## 2021-04-08 NOTE — Telephone Encounter (Signed)
Patient called he is returning your phone call, call back:[phone]

## 2021-04-08 NOTE — Telephone Encounter (Signed)
Left message #4 to schedule OV.

## 2021-04-09 NOTE — Telephone Encounter (Signed)
Pt states he apologizes he didn't realize his phone was messing up but he has a different number he would like [place] to try.   [phone]

## 2021-04-10 ENCOUNTER — Telehealth: Payer: Self-pay | Admitting: Physical Medicine and Rehabilitation

## 2021-04-10 NOTE — Telephone Encounter (Signed)
Patient called needing to schedule an appointment with Dr. Ernestina Patches for his lower back. The number to contact patient is [phone]

## 2021-04-10 NOTE — Telephone Encounter (Signed)
Se previous message.

## 2021-04-10 NOTE — Telephone Encounter (Signed)
Scheduled for 6/28 at 1000.

## 2021-04-23 ENCOUNTER — Other Ambulatory Visit: Payer: Self-pay

## 2021-04-23 ENCOUNTER — Encounter: Payer: Self-pay | Admitting: Physical Medicine and Rehabilitation

## 2021-04-23 ENCOUNTER — Ambulatory Visit (INDEPENDENT_AMBULATORY_CARE_PROVIDER_SITE_OTHER): Payer: Medicare Other | Admitting: Physical Medicine and Rehabilitation

## 2021-04-23 ENCOUNTER — Other Ambulatory Visit: Payer: Self-pay | Admitting: Physical Medicine and Rehabilitation

## 2021-04-23 VITALS — BP 115/72 | HR 69

## 2021-04-23 DIAGNOSIS — G2581 Restless legs syndrome: Secondary | ICD-10-CM

## 2021-04-23 DIAGNOSIS — M545 Low back pain, unspecified: Secondary | ICD-10-CM

## 2021-04-23 DIAGNOSIS — G259 Extrapyramidal and movement disorder, unspecified: Secondary | ICD-10-CM | POA: Diagnosis not present

## 2021-04-23 DIAGNOSIS — G8929 Other chronic pain: Secondary | ICD-10-CM | POA: Diagnosis not present

## 2021-04-23 DIAGNOSIS — M5136 Other intervertebral disc degeneration, lumbar region: Secondary | ICD-10-CM | POA: Diagnosis not present

## 2021-04-23 DIAGNOSIS — M47816 Spondylosis without myelopathy or radiculopathy, lumbar region: Secondary | ICD-10-CM

## 2021-04-23 MED ORDER — GABAPENTIN 600 MG PO TABS: 300.0000 mg | ORAL_TABLET | Freq: Every day | ORAL | 0 refills | Status: DC

## 2021-04-23 NOTE — Progress Notes (Signed)
No relief of pain following right L2-3 IL on 5/26. Pain in middle of low back. Hurts when going from sit to stand or when getting up in morning. Also notices increase in pain when walking longer distances.  Numeric Pain Rating Scale and Functional Assessment Average Pain 8   In the last MONTH (on 0-10 scale) has pain interfered with the following?  1. General activity like being  able to carry out your everyday physical activities such as walking, climbing stairs, carrying groceries, or moving a chair?  Rating(7)

## 2021-04-23 NOTE — Telephone Encounter (Signed)
Please advise 

## 2021-04-24 ENCOUNTER — Encounter: Payer: Self-pay | Admitting: Physical Medicine and Rehabilitation

## 2021-04-24 DIAGNOSIS — M47816 Spondylosis without myelopathy or radiculopathy, lumbar region: Secondary | ICD-10-CM | POA: Insufficient documentation

## 2021-04-24 DIAGNOSIS — M5136 Other intervertebral disc degeneration, lumbar region: Secondary | ICD-10-CM | POA: Insufficient documentation

## 2021-04-24 DIAGNOSIS — G2581 Restless legs syndrome: Secondary | ICD-10-CM | POA: Insufficient documentation

## 2021-04-24 NOTE — Progress Notes (Signed)
Randall [name] - 75 y.o. male MRN [id number]  Date of birth: [birth year]  Office Visit Note: Visit Date: 04/23/2021 PCP: Hulan Fess, MD Referred by: Hulan Fess, MD  Subjective: Chief Complaint  Patient presents with   Lower Back - Pain   HPI: Randall [name] is a 75 y.o. male who comes in today For evaluation and management of chronic severe now worsening over the last several months low back pain some referral to the buttock but not much which has been recalcitrant to now epidural injection at L2-3 as well as course of oral steroids.  Patient has a chronic history of back pain mostly axial pain.  This is been ongoing for many years.  He does still try to stay active and does home exercises.  He has not had specific physical therapy recently.  He has had it in the past.  He has no pain down the legs.  He has had prior lumbar laminectomy in 2015 and is status post cervical surgery which was a posterior fusion I believe and foraminotomies in 2013.  We have seen him on numerous occasions over the years for combination of radiofrequency ablation and epidural injection.  By way of quick review facet joint injections helped the most but he did not get as much relief from the ablation this was performed in 2017.  This was at L4-5 and L3-4.  We have updated MRI more recently showing just small progressive changes with worse findings at L3-4 with facet arthropathy and lateral recess narrowing but no high-grade central stenosis no nerve compression etc.  He has not had diagnostic facet joint blocks at L3-4 in some time.  He is maintained on tramadol for which he takes about 4/day and I have been refilling those medicines and he has not had any aberrant usage.  Secondarily the last time we saw him he complained of weakness with walking but not of this feet it was more of a genu recurvatum.  On exam he had no focal weakness.  Again updated MRI does not show any reason for focal weakness of the lumbar nerve  roots.  He reports to me today that the weakness with walking with his knees and legs has actually resolved.  The story is somewhat confusing but it sounds like he discontinued use of ropinirole which she has been using for many years for restless leg syndrome.  He reports his father had restless leg syndrome as well.  He reports since stopping that he has had better control of blood pressure as well.  He has not had the leg weakness or gait abnormality since that time.  Review of Systems  Musculoskeletal:  Positive for back pain.  Neurological:  Positive for weakness.  All other systems reviewed and are negative. Otherwise per HPI.  Assessment & Plan: Visit Diagnoses:    ICD-10-CM   1. Spondylosis without myelopathy or radiculopathy, lumbar region  M47.816     2. Chronic bilateral low back pain without sciatica  M54.50    G89.29     3. Other intervertebral disc degeneration, lumbar region  M51.36     4. Restless leg syndrome  G25.81     5. Movement disorder  G25.9        Plan: Findings:  1.  Chronic severe recalcitrant low back pain that does not really been amenable to much to medications and exercise and injections.  Radiofrequency ablation in 2017 was not as successful as we had hoped and what would  have been indicated by the facet blocks.  I think there might have been some other underlying issues at the time and I think may be some of that got clouded because he actually did fairly well for a while after that.  He has been maintained on tramadol which he uses without any aberrant use.  Now he has had more recent increasing pain over the last many months without any specific injury.  Again has failed care with this including updating MRI and epidural injection.  I think the neck step is diagnostic facet blocks again at L3-4 and just see how much relief he gets.  If it is really beneficial then I think he would do well with an ablation at again at that level.  He has no other findings in  terms of his lumbar spine other than just degenerative changes overall.  He would be a good candidate for spinal cord stimulator trial and is something he is thought about.  2.  Gait abnormality and this genu recurvatum that he talked about although I did not witness.  This is resolved after he quit taking his ropinirole for restless leg syndrome.  That medication does affect dopamine and I wonder if there is just something underlying this with a movement disorder.  He has no cogwheeling or anything today to look at anything specific.  Consideration given to referral to Dr. Wells Guiles Tat for movement disorders if this were to progress.  I am going to start him on some gabapentin at night to see if that will help his restless leg.  He can also follow-up with his primary care physician in terms of restless leg as well.  If his restless leg symptoms are significant and not controlled that may be another reason to having him referred to a neurologist.  Lastly I was concerned potentially for gait changes and leg weakness Sharol Given may be cervical issues that he is not complained of in some time.  He is status post prior cervical surgery with some history of pseudoarthrosis.   Meds & Orders:  Meds ordered this encounter  Medications   gabapentin (NEURONTIN) 600 MG tablet    Sig: Take 0.5 tablets (300 mg total) by mouth at bedtime. May increase by 0.5 tab over course of two weeks with max dose of 900mg     Dispense:  90 tablet    Refill:  0    Fill one day early if pharmacy is closed on scheduled refill date. May substitute for generic if available.   No orders of the defined types were placed in this encounter.   Follow-up: Return for L3-4 facet joint blocks bilateral.   Procedures: No procedures performed      Clinical History: MRI LUMBAR SPINE WITHOUT CONTRAST   TECHNIQUE: Multiplanar, multisequence MR imaging of the lumbar spine was performed. No intravenous contrast was administered.   COMPARISON:   MRI of the lumbar spine 02/19/2018   FINDINGS: Segmentation: 5 non rib-bearing lumbar type vertebral bodies are present. The lowest fully formed vertebral body is L5.   Alignment: No significant listhesis is present. Mild straightening of the normal lumbar lordosis is stable.   Vertebrae: Mild endplate marrow changes are present posteriorly at L4-5. Marrow signal and vertebral body heights are otherwise normal.   Conus medullaris and cauda equina: Conus extends to the T12-L1 level. Conus and cauda equina appear normal.   Paraspinal and other soft tissues: Bilateral renal cystic change again noted, left greater than right. No new lesions are  present. No significant adenopathy is present.   Disc levels:   T12-L1: Negative.   L1-2: Mild disc bulging and facet hypertrophy is present without significant stenosis or change.   L2-3: Leftward disc bulging is present. Mild facet hypertrophy bilaterally is stable.   L3-4: A broad-based disc protrusion is again noted. Progressive subarticular narrowing is present, left greater than right. Moderate bilateral foraminal stenosis is similar the prior exam, left greater than right.   L4-5: Wide laminectomy decompresses the central canal. Moderate foraminal narrowing bilaterally is similar the prior study.   L5-S1: Negative.   IMPRESSION: 1. Progressive subarticular narrowing at L3-4 is worse on the left despite laminectomy. 2. Moderate bilateral foraminal stenosis at L3-4 is similar the prior exam, left greater than right. 3. Wide laminectomy at L4-5 with stable moderate foraminal narrowing bilaterally. 4. Leftward disc bulging and facet hypertrophy at L1-2 and L2-3 without significant stenosis or change.     Electronically Signed   By: San Morelle M.D.   On: 03/18/2021 14:44   He reports that he has quit smoking. He has a 40.00 pack-year smoking history. He has never used smokeless tobacco. No results for input(s):  HGBA1C, LABURIC in the last 8760 hours.  Objective:  VS:  HT:    WT:   BMI:     BP:115/72  HR:69bpm  TEMP: ( )  RESP:  Physical Exam Vitals and nursing note reviewed.  Constitutional:      General: He is not in acute distress.    Appearance: Normal appearance. He is not ill-appearing.  HENT:     Head: Normocephalic and atraumatic.     Right Ear: External ear normal.     Left Ear: External ear normal.     Nose: No congestion.  Eyes:     Extraocular Movements: Extraocular movements intact.  Cardiovascular:     Rate and Rhythm: Normal rate.     Pulses: Normal pulses.  Pulmonary:     Effort: Pulmonary effort is normal. No respiratory distress.  Abdominal:     General: There is no distension.     Palpations: Abdomen is soft.  Musculoskeletal:        General: No tenderness or signs of injury.     Cervical back: Neck supple.     Right lower leg: No edema.     Left lower leg: No edema.     Comments: Patient has good proximal distal lower extremity strength without clonus.  No pain with hip rotation.  Somewhat slow to rise from a seated position and does have pain with extension and facet loading.  He does ambulate with a forward flexed lumbar spine.  Somewhat of a short based gait when he walks.  No genu recurvatum again noticed.  Skin:    Findings: No erythema or rash.  Neurological:     General: No focal deficit present.     Mental Status: He is alert and oriented to person, place, and time.     Sensory: No sensory deficit.     Motor: No weakness or abnormal muscle tone.     Coordination: Coordination normal.  Psychiatric:        Mood and Affect: Mood normal.        Behavior: Behavior normal.    Ortho Exam  Imaging: No results found.  Past Medical/Family/Surgical/Social History: Medications & Allergies reviewed per EMR, new medications updated. 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 1 by Cysto   RLS (restless legs syndrome)    Family History  Problem Relation Age of Onset   Anesthesia problems Neg Hx    Past Surgical History:  Procedure Laterality Date   Cervial  May 2012   4-5, 6-7   LUMBAR LAMINECTOMY N/A 05/15/2014   Procedure: MICRODISCECTOMY LUMBAR LAMINECTOMY;  Surgeon: Marybelle Killings, MD;  Location: [facility];  Service: Orthopedics;  Laterality: N/A;  L3-4, L4-5 Decompression, Left L3-4, L4-5 Microdiscectomy   POSTERIOR CERVICAL FUSION/FORAMINOTOMY  12/19/2011   Procedure: POSTERIOR CERVICAL FUSION/FORAMINOTOMY LEVEL 2;  Surgeon: Marybelle Killings, MD;  Location: [facility];  Service: Orthopedics;  Laterality: N/A;  Posterior Cervical Fusion, C4-5, C6-7 Wiring, Vitoss, Iliac aspirate   TONSILLECTOMY     Social History   Occupational History   Not on file  Tobacco Use   Smoking status: Former    Packs/day: 1.00    Years: 40.00    Pack years: 40.00    Types: Cigarettes   Smokeless tobacco: Never   Tobacco comments:    Encouraged to remain smoke free.  Substance and Sexual Activity   Alcohol use: Yes    Comment: occ   Drug use: No   Sexual activity: Not on file

## 2021-04-25 ENCOUNTER — Ambulatory Visit: Payer: Self-pay

## 2021-04-25 ENCOUNTER — Other Ambulatory Visit: Payer: Self-pay

## 2021-04-25 ENCOUNTER — Ambulatory Visit (INDEPENDENT_AMBULATORY_CARE_PROVIDER_SITE_OTHER): Payer: Medicare Other | Admitting: Physical Medicine and Rehabilitation

## 2021-04-25 ENCOUNTER — Encounter: Payer: Self-pay | Admitting: Physical Medicine and Rehabilitation

## 2021-04-25 VITALS — BP 127/76 | HR 69

## 2021-04-25 DIAGNOSIS — M47816 Spondylosis without myelopathy or radiculopathy, lumbar region: Secondary | ICD-10-CM

## 2021-04-25 MED ORDER — METHYLPREDNISOLONE ACETATE 80 MG/ML IJ SUSP
80.0000 mg | Freq: Once | INTRAMUSCULAR | Status: AC
  Administered 2021-04-25: 80 mg

## 2021-04-25 NOTE — Progress Notes (Signed)
Patient states he is still having low back pain. Taking Tramadol and Gabapentin.  Pain is worse with movement.    Numeric Pain Rating Scale and Functional Assessment Average Pain 5   In the last MONTH (on 0-10 scale) has pain interfered with the following?  1. General activity like being  able to carry out your everyday physical activities such as walking, climbing stairs, carrying groceries, or moving a chair?  Rating(8)   +Driver, -BT, -Dye Allergies.

## 2021-04-26 NOTE — Procedures (Signed)
Lumbar Facet Joint Intra-Articular Injection(s) with Fluoroscopic Guidance  Patient: Randall [name]      Date of Birth: 08-19-1946 MRN: [id number] PCP: Hulan Fess, MD      Visit Date: 04/25/2021   Universal Protocol:    Date/Time: 04/25/2021  Consent Given By: the patient  Position: PRONE   Additional Comments: Vital signs were monitored before and after the procedure. Patient was prepped and draped in the usual sterile fashion. The correct patient, procedure, and site was verified.   Injection Procedure Details:  Procedure Site One Meds Administered:  Meds ordered this encounter  Medications   methylPREDNISolone acetate (DEPO-MEDROL) injection 80 mg     Laterality: Bilateral  Location/Site:  L3-L4  Needle size: 22 guage  Needle type: Spinal  Needle Placement: Articular  Findings:  -Comments: Excellent flow of contrast producing a partial arthrogram.  Procedure Details: The fluoroscope beam is vertically oriented in AP, and the inferior recess is visualized beneath the lower pole of the inferior apophyseal process, which represents the target point for needle insertion. When direct visualization is difficult the target point is located at the medial projection of the vertebral pedicle. The region overlying each aforementioned target is locally anesthetized with a 1 to 2 ml. volume of 1% Lidocaine without Epinephrine.   The spinal needle was inserted into each of the above mentioned facet joints using biplanar fluoroscopic guidance. A 0.25 to 0.5 ml. volume of Isovue-250 was injected and a partial facet joint arthrogram was obtained. A single spot film was obtained of the resulting arthrogram.    One to 1.25 ml of the steroid/anesthetic solution was then injected into each of the facet joints noted above.   Additional Comments:  The patient tolerated the procedure well Dressing: 2 x 2 sterile gauze and Band-Aid    Post-procedure details: Patient was observed  during the procedure. Post-procedure instructions were reviewed.  Patient left the clinic in stable condition.

## 2021-04-26 NOTE — Progress Notes (Signed)
Randall [name] - 75 y.o. male MRN [id number]  Date of birth: 06-08-1946  Office Visit Note: Visit Date: 04/25/2021 PCP: Hulan Fess, MD Referred by: Hulan Fess, MD  Subjective: Chief Complaint  Patient presents with   Lower Back - Pain   HPI:  Randall [name] is a 75 y.o. male who comes in today for planned Bilateral  L3-L4 Lumbar facet/medial branch block with fluoroscopic guidance.  The patient has failed conservative care including home exercise, medications, time and activity modification.  This injection will be diagnostic and hopefully therapeutic.  Please see requesting physician notes for further details and justification.  Exam has shown concordant pain with facet joint loading.   ROS Otherwise per HPI.  Assessment & Plan: Visit Diagnoses:    ICD-10-CM   1. Spondylosis without myelopathy or radiculopathy, lumbar region  M47.816 XR C-ARM NO REPORT    Facet Injection    methylPREDNISolone acetate (DEPO-MEDROL) injection 80 mg      Plan: No additional findings.   Meds & Orders:  Meds ordered this encounter  Medications   methylPREDNISolone acetate (DEPO-MEDROL) injection 80 mg    Orders Placed This Encounter  Procedures   Facet Injection   XR C-ARM NO REPORT    Follow-up: Return if symptoms worsen or fail to improve.   Procedures: No procedures performed  Lumbar Facet Joint Intra-Articular Injection(s) with Fluoroscopic Guidance  Patient: Randall [name]      Date of Birth: October 20, 1946 MRN: [id number] PCP: Hulan Fess, MD      Visit Date: 04/25/2021   Universal Protocol:    Date/Time: 04/25/2021  Consent Given By: the patient  Position: PRONE   Additional Comments: Vital signs were monitored before and after the procedure. Patient was prepped and draped in the usual sterile fashion. The correct patient, procedure, and site was verified.   Injection Procedure Details:  Procedure Site One Meds Administered:  Meds ordered this encounter   Medications   methylPREDNISolone acetate (DEPO-MEDROL) injection 80 mg     Laterality: Bilateral  Location/Site:  L3-L4  Needle size: 22 guage  Needle type: Spinal  Needle Placement: Articular  Findings:  -Comments: Excellent flow of contrast producing a partial arthrogram.  Procedure Details: The fluoroscope beam is vertically oriented in AP, and the inferior recess is visualized beneath the lower pole of the inferior apophyseal process, which represents the target point for needle insertion. When direct visualization is difficult the target point is located at the medial projection of the vertebral pedicle. The region overlying each aforementioned target is locally anesthetized with a 1 to 2 ml. volume of 1% Lidocaine without Epinephrine.   The spinal needle was inserted into each of the above mentioned facet joints using biplanar fluoroscopic guidance. A 0.25 to 0.5 ml. volume of Isovue-250 was injected and a partial facet joint arthrogram was obtained. A single spot film was obtained of the resulting arthrogram.    One to 1.25 ml of the steroid/anesthetic solution was then injected into each of the facet joints noted above.   Additional Comments:  The patient tolerated the procedure well Dressing: 2 x 2 sterile gauze and Band-Aid    Post-procedure details: Patient was observed during the procedure. Post-procedure instructions were reviewed.  Patient left the clinic in stable condition.    Clinical History: MRI LUMBAR SPINE WITHOUT CONTRAST   TECHNIQUE: Multiplanar, multisequence MR imaging of the lumbar spine was performed. No intravenous contrast was administered.   COMPARISON:  MRI of the lumbar spine 02/19/2018  FINDINGS: Segmentation: 5 non rib-bearing lumbar type vertebral bodies are present. The lowest fully formed vertebral body is L5.   Alignment: No significant listhesis is present. Mild straightening of the normal lumbar lordosis is stable.    Vertebrae: Mild endplate marrow changes are present posteriorly at L4-5. Marrow signal and vertebral body heights are otherwise normal.   Conus medullaris and cauda equina: Conus extends to the T12-L1 level. Conus and cauda equina appear normal.   Paraspinal and other soft tissues: Bilateral renal cystic change again noted, left greater than right. No new lesions are present. No significant adenopathy is present.   Disc levels:   T12-L1: Negative.   L1-2: Mild disc bulging and facet hypertrophy is present without significant stenosis or change.   L2-3: Leftward disc bulging is present. Mild facet hypertrophy bilaterally is stable.   L3-4: A broad-based disc protrusion is again noted. Progressive subarticular narrowing is present, left greater than right. Moderate bilateral foraminal stenosis is similar the prior exam, left greater than right.   L4-5: Wide laminectomy decompresses the central canal. Moderate foraminal narrowing bilaterally is similar the prior study.   L5-S1: Negative.   IMPRESSION: 1. Progressive subarticular narrowing at L3-4 is worse on the left despite laminectomy. 2. Moderate bilateral foraminal stenosis at L3-4 is similar the prior exam, left greater than right. 3. Wide laminectomy at L4-5 with stable moderate foraminal narrowing bilaterally. 4. Leftward disc bulging and facet hypertrophy at L1-2 and L2-3 without significant stenosis or change.     Electronically Signed   By: San Morelle M.D.   On: 03/18/2021 14:44     Objective:  VS:  HT:    WT:   BMI:     BP:127/76  HR:69bpm  TEMP: ( )  RESP:  Physical Exam Vitals and nursing note reviewed.  Constitutional:      General: He is not in acute distress.    Appearance: Normal appearance. He is not ill-appearing.  HENT:     Head: Normocephalic and atraumatic.     Right Ear: External ear normal.     Left Ear: External ear normal.     Nose: No congestion.  Eyes:      Extraocular Movements: Extraocular movements intact.  Cardiovascular:     Rate and Rhythm: Normal rate.     Pulses: Normal pulses.  Pulmonary:     Effort: Pulmonary effort is normal. No respiratory distress.  Abdominal:     General: There is no distension.     Palpations: Abdomen is soft.  Musculoskeletal:        General: No tenderness or signs of injury.     Cervical back: Neck supple.     Right lower leg: No edema.     Left lower leg: No edema.     Comments: Patient has good distal strength without clonus.  Skin:    Findings: No erythema or rash.  Neurological:     General: No focal deficit present.     Mental Status: He is alert and oriented to person, place, and time.     Sensory: No sensory deficit.     Motor: No weakness or abnormal muscle tone.     Coordination: Coordination normal.  Psychiatric:        Mood and Affect: Mood normal.        Behavior: Behavior normal.     Imaging: XR C-ARM NO REPORT  Result Date: 04/25/2021 Please see Notes tab for imaging impression.

## 2021-04-29 ENCOUNTER — Other Ambulatory Visit: Payer: Self-pay | Admitting: Physical Medicine and Rehabilitation

## 2021-04-29 DIAGNOSIS — M25551 Pain in right hip: Secondary | ICD-10-CM

## 2021-05-09 DIAGNOSIS — E785 Hyperlipidemia, unspecified: Secondary | ICD-10-CM | POA: Diagnosis not present

## 2021-05-09 DIAGNOSIS — J449 Chronic obstructive pulmonary disease, unspecified: Secondary | ICD-10-CM | POA: Diagnosis not present

## 2021-05-09 DIAGNOSIS — N401 Enlarged prostate with lower urinary tract symptoms: Secondary | ICD-10-CM | POA: Diagnosis not present

## 2021-05-09 DIAGNOSIS — J441 Chronic obstructive pulmonary disease with (acute) exacerbation: Secondary | ICD-10-CM | POA: Diagnosis not present

## 2021-05-09 DIAGNOSIS — M159 Polyosteoarthritis, unspecified: Secondary | ICD-10-CM | POA: Diagnosis not present

## 2021-05-09 DIAGNOSIS — I10 Essential (primary) hypertension: Secondary | ICD-10-CM | POA: Diagnosis not present

## 2021-05-09 DIAGNOSIS — M47812 Spondylosis without myelopathy or radiculopathy, cervical region: Secondary | ICD-10-CM | POA: Diagnosis not present

## 2021-05-24 ENCOUNTER — Other Ambulatory Visit: Payer: Self-pay | Admitting: Physical Medicine and Rehabilitation

## 2021-05-27 NOTE — Telephone Encounter (Signed)
Please advise 

## 2021-05-30 ENCOUNTER — Other Ambulatory Visit: Payer: Self-pay | Admitting: Physical Medicine and Rehabilitation

## 2021-05-30 DIAGNOSIS — M25551 Pain in right hip: Secondary | ICD-10-CM

## 2021-06-04 ENCOUNTER — Telehealth: Payer: Self-pay | Admitting: Physical Medicine and Rehabilitation

## 2021-06-04 DIAGNOSIS — M25551 Pain in right hip: Secondary | ICD-10-CM

## 2021-06-04 MED ORDER — TRAMADOL HCL 50 MG PO TABS: 50.0000 mg | ORAL_TABLET | Freq: Two times a day (BID) | ORAL | 0 refills | Status: DC | PRN

## 2021-06-04 NOTE — Telephone Encounter (Signed)
Pt called stating his [facility] pharmacy told him he has to get Dr.Newton to call and give them the ok to release his tramadol rx. Pt would Like Dr.Newton to do this and then give him a CB so he knows when he'll be able to get his rx.   [facility] [phone]

## 2021-06-04 NOTE — Telephone Encounter (Signed)
Refill request went to [facility]. Please advise.

## 2021-06-04 NOTE — Telephone Encounter (Signed)
Called patient to advise that prescription has been sent to his pharmacy.

## 2021-07-03 ENCOUNTER — Other Ambulatory Visit: Payer: Self-pay | Admitting: Physical Medicine and Rehabilitation

## 2021-07-03 DIAGNOSIS — M25551 Pain in right hip: Secondary | ICD-10-CM

## 2021-07-03 NOTE — Telephone Encounter (Signed)
Please advise 

## 2021-08-02 ENCOUNTER — Other Ambulatory Visit: Payer: Self-pay | Admitting: Physical Medicine and Rehabilitation

## 2021-08-02 DIAGNOSIS — M25551 Pain in right hip: Secondary | ICD-10-CM

## 2021-08-02 NOTE — Telephone Encounter (Signed)
Please advise 

## 2021-08-19 DIAGNOSIS — I10 Essential (primary) hypertension: Secondary | ICD-10-CM | POA: Diagnosis not present

## 2021-08-19 DIAGNOSIS — R42 Dizziness and giddiness: Secondary | ICD-10-CM | POA: Diagnosis not present

## 2021-08-21 ENCOUNTER — Telehealth: Payer: Self-pay

## 2021-08-21 NOTE — Telephone Encounter (Signed)
NOTES SCANNED TO REFERRAL 

## 2021-08-21 NOTE — Telephone Encounter (Signed)
ERROR

## 2021-08-22 DIAGNOSIS — Z23 Encounter for immunization: Secondary | ICD-10-CM | POA: Diagnosis not present

## 2021-09-02 ENCOUNTER — Other Ambulatory Visit: Payer: Self-pay | Admitting: Physical Medicine and Rehabilitation

## 2021-09-02 DIAGNOSIS — M25551 Pain in right hip: Secondary | ICD-10-CM

## 2021-09-02 DIAGNOSIS — R3915 Urgency of urination: Secondary | ICD-10-CM | POA: Diagnosis not present

## 2021-09-02 DIAGNOSIS — N401 Enlarged prostate with lower urinary tract symptoms: Secondary | ICD-10-CM | POA: Diagnosis not present

## 2021-09-02 NOTE — Telephone Encounter (Signed)
Please advise 

## 2021-09-24 ENCOUNTER — Other Ambulatory Visit: Payer: Self-pay

## 2021-09-24 ENCOUNTER — Encounter: Payer: Self-pay | Admitting: Cardiovascular Disease

## 2021-09-24 ENCOUNTER — Ambulatory Visit (INDEPENDENT_AMBULATORY_CARE_PROVIDER_SITE_OTHER): Payer: Medicare Other | Admitting: Cardiovascular Disease

## 2021-09-24 VITALS — BP 132/68 | HR 64 | Ht 67.0 in | Wt 183.8 lb

## 2021-09-24 DIAGNOSIS — E7800 Pure hypercholesterolemia, unspecified: Secondary | ICD-10-CM | POA: Diagnosis not present

## 2021-09-24 DIAGNOSIS — I10 Essential (primary) hypertension: Secondary | ICD-10-CM

## 2021-09-24 DIAGNOSIS — I7143 Infrarenal abdominal aortic aneurysm, without rupture: Secondary | ICD-10-CM | POA: Diagnosis not present

## 2021-09-24 DIAGNOSIS — R55 Syncope and collapse: Secondary | ICD-10-CM

## 2021-09-24 NOTE — Progress Notes (Signed)
Cardiology Office Note:    Date:  09/24/2021   ID:  Randall [name], DOB 09-26-1946, MRN [id number]  PCP:  Hulan Fess, MD   [facility] Providers Cardiologist:  None     Referring MD: Faustino Congress, NP   Chief Complaint  Patient presents with   Palpitations   Dizziness        Randall [name] is a 75 y.o. male who is being seen today for the evaluation of palpitations and near syncope at the request of Faustino Congress, NP.   History of Present Illness:    Randall [name] is a 75 y.o. male with a hx of treated systemic hypertension and hypercholesterolemia, AAA, nephrolithiasis, lumbar spine disease (2 previous surgical procedures), presents after a single event of intense palpitations associated with dizziness that occurred about a month ago.  He had recently had some changes in his medication switching from [place] to [place].  He woke up with a pounding sensation in his chest.  The heart rate was not particularly fast and it was regular, but very noticeable.  He felt dizzy and his blood pressure was around 105-110, which is much lower than his usual.  He also had a headache, but no focal neurological complaints.  The symptoms resolve with by themselves in a couple of days.  He did not have chest pain or shortness of breath and he has not had any exertional angina or dyspnea with household chores or yard work.  The palpitations have not recurred since.  He has never had full-blown syncope.  He denies lower extremity edema and claudication.  He has not had orthopnea or PND.  He had a 3.1 cm AAA diagnosed incidentally when he got a lumbar spine MRI in 2017.  This was most recently evaluated with ultrasound in December 2020 when it measured 3.5 cm in diameter.  In 2015 he was diagnosed as having some mild carotid artery stenosis, but subsequent repeat ultrasonography has not shown any evidence of obstructive disease.  He has had problems with nephrolithiasis and required a  ureteral stent in 2019.  He has had lithotripsy twice.  He quit smoking in 2017.  On statin therapy his recent LDL cholesterol was excellent at 49.  He does not have diabetes mellitus.  His father died of a "enlarged heart" at age 70, but did not have a heart attack as far as we know.  The patient's mother also died of heart disease in her late 64s, details unknown.  He has a brother that has a AAA.  Also a smoker.  Past Medical History:  Diagnosis Date   Kidney stones    1 removed by litho. 1 by Cysto   RLS (restless legs syndrome)     Past Surgical History:  Procedure Laterality Date   Cervial  May 2012   4-5, 6-7   LUMBAR LAMINECTOMY N/A 05/15/2014   Procedure: MICRODISCECTOMY LUMBAR LAMINECTOMY;  Surgeon: Marybelle Killings, MD;  Location: [place];  Service: Orthopedics;  Laterality: N/A;  L3-4, L4-5 Decompression, Left L3-4, L4-5 Microdiscectomy   POSTERIOR CERVICAL FUSION/FORAMINOTOMY  12/19/2011   Procedure: POSTERIOR CERVICAL FUSION/FORAMINOTOMY LEVEL 2;  Surgeon: Marybelle Killings, MD;  Location: [place];  Service: Orthopedics;  Laterality: N/A;  Posterior Cervical Fusion, C4-5, C6-7 Wiring, Vitoss, Iliac aspirate   TONSILLECTOMY      Current Medications: Current Meds  Medication Sig   amLODipine (NORVASC) 5 MG tablet Take 5 mg by mouth daily.   aspirin 81 MG chewable  tablet 1 tablet   augmented betamethasone dipropionate (DIPROLENE-AF) 0.05 % cream Apply 1 application topically daily.   losartan (COZAAR) 100 MG tablet 1 tablet   MYRBETRIQ 50 MG TB24 tablet Take 50 mg by mouth daily.   pravastatin (PRAVACHOL) 20 MG tablet Take 20 mg by mouth daily.   rOPINIRole (REQUIP) 1 MG tablet TAKE 1 TABLET BY MOUTH 1 TO 3 HOURS BEFORE BEDTIME ORALLY AT BEDTIME 90 DAYS   SYMBICORT 160-4.5 MCG/ACT inhaler Inhale into the lungs.   tamsulosin (FLOMAX) 0.4 MG CAPS capsule Take 0.4 mg by mouth daily.   traMADol (ULTRAM) 50 MG tablet TAKE 1 TABLET (50 MG TOTAL) BY MOUTH EVERY 12 (TWELVE) HOURS AS NEEDED  FOR MODERATE PAIN OR SEVERE PAIN.     Allergies:   Patient has no known allergies.   Social History   Socioeconomic History   Marital status: Married    Spouse name: Not on file   Number of children: Not on file   Years of education: Not on file   Highest education level: Not on file  Occupational History   Not on file  Tobacco Use   Smoking status: Former    Packs/day: 1.00    Years: 40.00    Pack years: 40.00    Types: Cigarettes   Smokeless tobacco: Never   Tobacco comments:    Encouraged to remain smoke free.  Substance and Sexual Activity   Alcohol use: Yes    Comment: occ   Drug use: No   Sexual activity: Not on file  Other Topics Concern   Not on file  Social History Narrative   Not on file   Social Determinants of Health   Financial Resource Strain: Not on file  Food Insecurity: Not on file  Transportation Needs: Not on file  Physical Activity: Not on file  Stress: Not on file  Social Connections: Not on file     Family History: The patient's family history is negative for Anesthesia problems.  ROS:   Please see the history of present illness.     All other systems reviewed and are negative.  EKGs/Labs/Other Studies Reviewed:    The following studies were reviewed today: Abdominal aortic ultrasound December 2020, maximum diameter 3.5 cm Duplex ultrasound December 2020 "moderate heterogenous and calcified plaque with no hemodynamically significant stenosis".  EKG:  EKG is  ordered today.  The ekg ordered today demonstrates sinus rhythm, completely normal tracing  Recent Labs: No results found for requested labs within last 8760 hours.  08/19/2021 Hemoglobin 15.0, creatinine 1.3, potassium 4.9, ALT 11 Recent Lipid Panel No results found for: CHOL, TRIG, HDL, CHOLHDL, VLDL, LDLCALC, LDLDIRECT 11/08/2020 Cholesterol 113, HDL 51, LDL 49, triglycerides 53 TSH 1.83  Risk Assessment/Calculations:           Physical Exam:    VS:  BP 132/68    Pulse 64   Ht 5\' 7"  (1.702 m)   Wt 183 lb 12.8 oz (83.4 kg)   SpO2 97%   BMI 28.79 kg/m     Wt Readings from Last 3 Encounters:  09/24/21 183 lb 12.8 oz (83.4 kg)  01/18/16 181 lb (82.1 kg)  05/15/14 181 lb 14.1 oz (82.5 kg)     GEN:  Well nourished, well developed in no acute distress HEENT: Normal NECK: No JVD; No carotid bruits LYMPHATICS: No lymphadenopathy CARDIAC: RRR, no murmurs, rubs, gallops RESPIRATORY:  Clear to auscultation without rales, wheezing or rhonchi  ABDOMEN: Soft, non-tender, non-distended.  No pulsatile mass  and no abdominal bruit MUSCULOSKELETAL:  No edema; No deformity  SKIN: Warm and dry NEUROLOGIC:  Alert and oriented x 3 PSYCHIATRIC:  Normal affect   ASSESSMENT:    1. Infrarenal abdominal aortic aneurysm (AAA) without rupture   2. Near syncope   3. Hypercholesterolemia   4. Essential (primary) hypertension    PLAN:    In order of problems listed above:  Presyncope/palpitations:It sounds like this was likely related to changes in blood pressure, may be during transition from [place] to [place]. Cannot entirely exclude a true arrhythmia, But it is quite unlikely we will catch this since he has not had any recurrent symptoms.  I think is more important to [place] make sure he does not have any structural cardiac abnormality that could cause arrhythmia or presyncope.  We will schedule for an echocardiogram. AAA: Time for repeat evaluation with ultrasonography.  We will schedule.  HLP: all parameters in range on statin. HTN: well controlled           Medication Adjustments/Labs and Tests Ordered: Current medicines are reviewed at length with the patient today.  Concerns regarding medicines are outlined above.  Orders Placed This Encounter  Procedures   EKG 12-Lead   ECHOCARDIOGRAM COMPLETE   VAS [place] AAA DUPLEX   No orders of the defined types were placed in this encounter.   Patient Instructions  Medication Instructions:  No changes *If  you need a refill on your cardiac medications before your next appointment, please call your pharmacy*   Lab Work: None ordered If you have labs (blood work) drawn today and your tests are completely normal, you will receive your results only by: [place] (if you have MyChart) OR A paper copy in the mail If you have any lab test that is abnormal or we need to change your treatment, we will call you to review the results.   Testing/Procedures: Your physician has requested that you have an abdominal aorta duplex. During this test, an ultrasound is used to evaluate the aorta. Allow 30 minutes for this exam. Do not eat after midnight the day before and avoid carbonated beverages. This will take place at [place], [address].  Your physician has requested that you have an echocardiogram. Echocardiography is a painless test that uses sound waves to create images of your heart. It provides your doctor with information about the size and shape of your heart and how well your heart's chambers and valves are working. You may receive an ultrasound enhancing agent through an IV if needed to better visualize your heart during the echo.This procedure takes approximately one hour. There are no restrictions for this procedure. This will take place at the [address].    Follow-Up: At [facility], you and your health needs are our priority.  As part of our continuing mission to provide you with exceptional heart care, we have created designated Provider Care Teams.  These Care Teams include your primary Cardiologist (physician) and Advanced Practice Providers (APPs -  Physician Assistants and Nurse Practitioners) who all work together to provide you with the care you need, when you need it.  We recommend signing up for the patient portal called "MyChart".  Sign up information is provided on this After Visit Summary.  MyChart is used to connect with patients for Virtual Visits  (Telemedicine).  Patients are able to view lab/test results, encounter notes, upcoming appointments, etc.  Non-urgent messages can be sent to your provider as  well.   To learn more about what you can do with MyChart, go to [email or web address].    Your next appointment:   Follow up with Dr. Sallyanne Kuster as needed or based on the results   Signed, Sanda Klein, MD  09/24/2021 2:23 PM    [place]

## 2021-09-24 NOTE — Patient Instructions (Signed)
Medication Instructions:  No changes *If you need a refill on your cardiac medications before your next appointment, please call your pharmacy*   Lab Work: None ordered If you have labs (blood work) drawn today and your tests are completely normal, you will receive your results only by: [place] (if you have MyChart) OR A paper copy in the mail If you have any lab test that is abnormal or we need to change your treatment, we will call you to review the results.   Testing/Procedures: Your physician has requested that you have an abdominal aorta duplex. During this test, an ultrasound is used to evaluate the aorta. Allow 30 minutes for this exam. Do not eat after midnight the day before and avoid carbonated beverages. This will take place at [place], [address].  Your physician has requested that you have an echocardiogram. Echocardiography is a painless test that uses sound waves to create images of your heart. It provides your doctor with information about the size and shape of your heart and how well your heart's chambers and valves are working. You may receive an ultrasound enhancing agent through an IV if needed to better visualize your heart during the echo.This procedure takes approximately one hour. There are no restrictions for this procedure. This will take place at the [address].    Follow-Up: At [facility], you and your health needs are our priority.  As part of our continuing mission to provide you with exceptional heart care, we have created designated Provider Care Teams.  These Care Teams include your primary Cardiologist (physician) and Advanced Practice Providers (APPs -  Physician Assistants and Nurse Practitioners) who all work together to provide you with the care you need, when you need it.  We recommend signing up for the patient portal called "MyChart".  Sign up information is provided on this After Visit Summary.  MyChart is used to  connect with patients for Virtual Visits (Telemedicine).  Patients are able to view lab/test results, encounter notes, upcoming appointments, etc.  Non-urgent messages can be sent to your provider as well.   To learn more about what you can do with MyChart, go to [email or web address].    Your next appointment:   Follow up with Dr. Sallyanne Kuster as needed or based on the results

## 2021-10-03 ENCOUNTER — Other Ambulatory Visit: Payer: Self-pay | Admitting: Physical Medicine and Rehabilitation

## 2021-10-03 DIAGNOSIS — M25551 Pain in right hip: Secondary | ICD-10-CM

## 2021-10-11 ENCOUNTER — Ambulatory Visit (HOSPITAL_COMMUNITY): Payer: Medicare Other | Attending: Cardiovascular Disease

## 2021-10-11 ENCOUNTER — Other Ambulatory Visit: Payer: Self-pay

## 2021-10-11 DIAGNOSIS — R55 Syncope and collapse: Secondary | ICD-10-CM | POA: Diagnosis not present

## 2021-10-11 LAB — ECHOCARDIOGRAM COMPLETE
Area-P 1/2: 2.85 cm2
P 1/2 time: 485 msec
S' Lateral: 3 cm

## 2021-10-14 ENCOUNTER — Telehealth: Payer: Self-pay | Admitting: Cardiovascular Disease

## 2021-10-14 NOTE — Telephone Encounter (Signed)
Spoke with patient regarding the following results. Patient made aware and patient verbalized understanding.   Randall Klein, MD  10/12/2021 11:20 AM EST     Overall very good news.  Heart pumping function was normal.  The left ventricle is slightly thicker than normal, probably due to longstanding high blood pressure which also explains the "diastolic dysfunction" meaning a stiffer than normal heart pump (although on my review, I actually think he has pretty good relaxation properties for his age).  The ascending aorta is just very slightly dilated compared to normal (his measures 3.9 cm and we generally talk about dangerous aneurysms that would require surgery when it is over 5 or 5.5 cm). Overall findings make it very unlikely that the episode of dizziness/presyncope was related to a rhythm problem, much more likely a drop in blood pressure as we discussed at the clinic appointment.   Advised patient to call back to office with any issues, questions, or concerns. Patient verbalized understanding.

## 2021-10-14 NOTE — Telephone Encounter (Signed)
° °  Pt is returning call to get echo result °

## 2021-10-18 ENCOUNTER — Ambulatory Visit (HOSPITAL_COMMUNITY)
Admission: RE | Admit: 2021-10-18 | Discharge: 2021-10-18 | Disposition: A | Discharge status: Home / Self Care | Payer: Medicare Other | Source: Ambulatory Visit | Attending: Cardiovascular Disease | Admitting: Cardiovascular Disease

## 2021-10-18 ENCOUNTER — Other Ambulatory Visit: Payer: Self-pay

## 2021-10-18 DIAGNOSIS — I7143 Infrarenal abdominal aortic aneurysm, without rupture: Secondary | ICD-10-CM | POA: Diagnosis not present

## 2021-10-22 ENCOUNTER — Other Ambulatory Visit: Payer: Self-pay | Admitting: *Deleted

## 2021-10-22 ENCOUNTER — Encounter: Payer: Self-pay | Admitting: *Deleted

## 2021-10-22 DIAGNOSIS — I7143 Infrarenal abdominal aortic aneurysm, without rupture: Secondary | ICD-10-CM

## 2021-10-23 NOTE — Telephone Encounter (Signed)
Pt informed of providers result & recommendations. Pt verbalized understanding. No further questions . ? ?

## 2021-10-23 NOTE — Telephone Encounter (Signed)
Pt is returning call for Echo results

## 2021-11-11 ENCOUNTER — Other Ambulatory Visit: Payer: Self-pay | Admitting: Physical Medicine and Rehabilitation

## 2021-11-11 DIAGNOSIS — M25551 Pain in right hip: Secondary | ICD-10-CM

## 2021-11-12 ENCOUNTER — Other Ambulatory Visit (HOSPITAL_COMMUNITY): Payer: Self-pay | Admitting: Cardiovascular Disease

## 2021-11-12 DIAGNOSIS — I7143 Infrarenal abdominal aortic aneurysm, without rupture: Secondary | ICD-10-CM

## 2021-11-14 DIAGNOSIS — E785 Hyperlipidemia, unspecified: Secondary | ICD-10-CM | POA: Diagnosis not present

## 2021-11-14 DIAGNOSIS — J449 Chronic obstructive pulmonary disease, unspecified: Secondary | ICD-10-CM | POA: Diagnosis not present

## 2021-11-14 DIAGNOSIS — Z125 Encounter for screening for malignant neoplasm of prostate: Secondary | ICD-10-CM | POA: Diagnosis not present

## 2021-11-14 DIAGNOSIS — N1831 Chronic kidney disease, stage 3a: Secondary | ICD-10-CM | POA: Diagnosis not present

## 2021-11-14 DIAGNOSIS — Z0000 Encounter for general adult medical examination without abnormal findings: Secondary | ICD-10-CM | POA: Diagnosis not present

## 2021-11-14 DIAGNOSIS — Z1389 Encounter for screening for other disorder: Secondary | ICD-10-CM | POA: Diagnosis not present

## 2021-11-14 DIAGNOSIS — I10 Essential (primary) hypertension: Secondary | ICD-10-CM | POA: Diagnosis not present

## 2021-11-14 DIAGNOSIS — N401 Enlarged prostate with lower urinary tract symptoms: Secondary | ICD-10-CM | POA: Diagnosis not present

## 2021-11-20 DIAGNOSIS — E785 Hyperlipidemia, unspecified: Secondary | ICD-10-CM | POA: Diagnosis not present

## 2021-11-20 DIAGNOSIS — J449 Chronic obstructive pulmonary disease, unspecified: Secondary | ICD-10-CM | POA: Diagnosis not present

## 2021-11-20 DIAGNOSIS — I10 Essential (primary) hypertension: Secondary | ICD-10-CM | POA: Diagnosis not present

## 2021-11-20 DIAGNOSIS — N1831 Chronic kidney disease, stage 3a: Secondary | ICD-10-CM | POA: Diagnosis not present

## 2021-11-20 DIAGNOSIS — Z23 Encounter for immunization: Secondary | ICD-10-CM | POA: Diagnosis not present

## 2021-11-20 DIAGNOSIS — N401 Enlarged prostate with lower urinary tract symptoms: Secondary | ICD-10-CM | POA: Diagnosis not present

## 2021-11-21 ENCOUNTER — Telehealth: Payer: Self-pay | Admitting: Physical Medicine and Rehabilitation

## 2021-11-21 NOTE — Telephone Encounter (Signed)
Patient called needing to schedule an appointment with Dr Ernestina Patches for his lower back. The number to contact patient is [phone]

## 2021-11-25 ENCOUNTER — Encounter: Payer: Self-pay | Admitting: Physical Medicine and Rehabilitation

## 2021-11-25 ENCOUNTER — Ambulatory Visit (INDEPENDENT_AMBULATORY_CARE_PROVIDER_SITE_OTHER): Payer: Medicare Other | Admitting: Physical Medicine and Rehabilitation

## 2021-11-25 ENCOUNTER — Other Ambulatory Visit: Payer: Self-pay

## 2021-11-25 VITALS — BP 111/69 | HR 85

## 2021-11-25 DIAGNOSIS — G894 Chronic pain syndrome: Secondary | ICD-10-CM | POA: Diagnosis not present

## 2021-11-25 DIAGNOSIS — M47816 Spondylosis without myelopathy or radiculopathy, lumbar region: Secondary | ICD-10-CM

## 2021-11-25 DIAGNOSIS — G8929 Other chronic pain: Secondary | ICD-10-CM

## 2021-11-25 DIAGNOSIS — F1190 Opioid use, unspecified, uncomplicated: Secondary | ICD-10-CM

## 2021-11-25 DIAGNOSIS — M545 Low back pain, unspecified: Secondary | ICD-10-CM

## 2021-11-25 NOTE — Progress Notes (Signed)
Randall [name] - 76 y.o. male MRN [id number]  Date of birth: 1946-05-27  Office Visit Note: Visit Date: 11/25/2021 PCP: Hulan Fess, MD Referred by: Hulan Fess, MD  Subjective: Chief Complaint  Patient presents with   Lower Back - Pain   Right Leg - Pain   HPI: Randall [name] is a 76 y.o. male who comes in today For evaluation of chronic, worsening and severe bilateral lower back pain. Patient reports pain has been ongoing for several years. Patient states pain is exacerbated by moving from sitting to standing position and prolonged standing/walking. Patient describes pain as constant soreness sensation, currently rates as 8 out of 10. Patient reports some relief of pain with home exercise program, rest and use of medications. Patient currently takes Tramadol as needed for moderate/severe pain that is prescribed by Dr. Magnus Sinning. Patient states he has tried taking Gabapentin in the past, however he was unable to tolerate this medication. Patient did attend formal physical therapy several years ago and reports no relief of pain with these treatments. Patients lumbar MRI from 2022 exhibits multi-level facet hypertrophy, left subarticular and foraminal narrowing at L3-L4, wide laminectomy noted at L4-L5. No high grade spinal canal stenosis noted. Patient has a history of 2 lumbar surgeries performed by Dr. Rodell Perna. Patient did have radiofrequency ablation performed several years ago, however this procedure was not beneficial and did not help to alleviate his pain. Patient reports greater than 50% pain relief for 3 weeks from bilateral L3-L4 intra-articular facet joint injections performed in June of 2022. Prior  Patient states he would like to repeat facet joint injections if possible. Patient states he is a very active person and does exercise on stationary bike/treadmill several times a week. Patient denies focal weakness, numbness and tingling. Patient denies recent trauma or falls.    Review of Systems  Musculoskeletal:  Positive for back pain.  Neurological:  Negative for tingling, sensory change, focal weakness and weakness.  All other systems reviewed and are negative. Otherwise per HPI.  Assessment & Plan: Visit Diagnoses:    ICD-10-CM   1. Spondylosis without myelopathy or radiculopathy, lumbar region  M47.816 Ambulatory referral to Physical Medicine Rehab    2. Chronic bilateral low back pain without sciatica  M54.50 Ambulatory referral to Physical Medicine Rehab   G89.29     3. Chronic pain syndrome  G89.4     4. Uncomplicated opioid use  Y81.44        Plan: Findings:  Chronic, worsening and severe bilateral axial back pain. Patient continues to have severe pain despite good conservative therapies such as formal physical therapy, home exercise regimen, rest and use of medications. Patients clinical presentation and exam are consistent with facet mediated pain. Patient does voice severe pain when moving from sitting to standing position. Radiofrequency ablation procedure in the past was not beneficial in alleviating pain, however recent facet joint injections did seem to provide significant pain relief. We believe the next step is to repeat bilateral L3-L4 intra-articular facet joint injections under fluoroscopic guidance. We did speak with patient about spinal cord stimulator, however patient is not interested in this procedure at this time. If patient gets good pain relief with facet joint injections we will continue to monitor, however if he continues to have pain we would possibly look at repeating radiofrequency ablation at different levels. Patient encouraged to remain active and to continue home exercises as tolerated. Patient encouraged to take Tramadol as directed. No red flag symptoms  noted upon exam today.    Meds & Orders: No orders of the defined types were placed in this encounter.   Orders Placed This Encounter  Procedures   Ambulatory referral to  Physical Medicine Rehab    Follow-up: Return for Bilateral L3-L4 intra-articular facet joint injections.   Procedures: No procedures performed      Clinical History: MRI LUMBAR SPINE WITHOUT CONTRAST   TECHNIQUE: Multiplanar, multisequence MR imaging of the lumbar spine was performed. No intravenous contrast was administered.   COMPARISON:  MRI of the lumbar spine 02/19/2018   FINDINGS: Segmentation: 5 non rib-bearing lumbar type vertebral bodies are present. The lowest fully formed vertebral body is L5.   Alignment: No significant listhesis is present. Mild straightening of the normal lumbar lordosis is stable.   Vertebrae: Mild endplate marrow changes are present posteriorly at L4-5. Marrow signal and vertebral body heights are otherwise normal.   Conus medullaris and cauda equina: Conus extends to the T12-L1 level. Conus and cauda equina appear normal.   Paraspinal and other soft tissues: Bilateral renal cystic change again noted, left greater than right. No new lesions are present. No significant adenopathy is present.   Disc levels:   T12-L1: Negative.   L1-2: Mild disc bulging and facet hypertrophy is present without significant stenosis or change.   L2-3: Leftward disc bulging is present. Mild facet hypertrophy bilaterally is stable.   L3-4: A broad-based disc protrusion is again noted. Progressive subarticular narrowing is present, left greater than right. Moderate bilateral foraminal stenosis is similar the prior exam, left greater than right.   L4-5: Wide laminectomy decompresses the central canal. Moderate foraminal narrowing bilaterally is similar the prior study.   L5-S1: Negative.   IMPRESSION: 1. Progressive subarticular narrowing at L3-4 is worse on the left despite laminectomy. 2. Moderate bilateral foraminal stenosis at L3-4 is similar the prior exam, left greater than right. 3. Wide laminectomy at L4-5 with stable moderate foraminal  narrowing bilaterally. 4. Leftward disc bulging and facet hypertrophy at L1-2 and L2-3 without significant stenosis or change.     Electronically Signed   By: San Morelle M.D.   On: 03/18/2021 14:44   He reports that he has quit smoking. His smoking use included cigarettes. He has a 40.00 pack-year smoking history. He has never used smokeless tobacco. No results for input(s): HGBA1C, LABURIC in the last 8760 hours.  Objective:  VS:  HT:     WT:    BMI:      BP:111/69   HR:85bpm   TEMP: ( )   RESP:  Physical Exam Vitals and nursing note reviewed.  HENT:     Head: Normocephalic and atraumatic.     Right Ear: External ear normal.     Left Ear: External ear normal.     Nose: Nose normal.     Mouth/Throat:     Mouth: Mucous membranes are moist.  Eyes:     Pupils: Pupils are equal, round, and reactive to light.  Cardiovascular:     Rate and Rhythm: Normal rate.     Pulses: Normal pulses.  Pulmonary:     Effort: Pulmonary effort is normal.  Abdominal:     General: Abdomen is flat. There is no distension.  Musculoskeletal:        General: Tenderness present.     Cervical back: Normal range of motion.     Comments: Pt rises from seated position to standing without difficulty. Concordant low back pain with facet loading, lumbar  spine extension and rotation. Strong distal strength without clonus, no pain upon palpation of greater trochanters. Sensation intact bilaterally. Walks independently, gait steady.   Skin:    General: Skin is warm and dry.     Capillary Refill: Capillary refill takes less than 2 seconds.  Neurological:     General: No focal deficit present.     Mental Status: He is alert and oriented to person, place, and time.  Psychiatric:        Mood and Affect: Mood normal.    Ortho Exam  Imaging: No results found.  Past Medical/Family/Surgical/Social History: Medications & Allergies reviewed per EMR, new medications updated. 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 1 by Cysto   RLS (restless legs syndrome)    Family History  Problem Relation Age of Onset   Anesthesia problems Neg Hx    Past Surgical History:  Procedure Laterality Date   Cervial  May 2012   4-5, 6-7   LUMBAR LAMINECTOMY N/A 05/15/2014   Procedure: MICRODISCECTOMY LUMBAR LAMINECTOMY;  Surgeon: Marybelle Killings, MD;  Location: [place];  Service: Orthopedics;  Laterality: N/A;  L3-4, L4-5 Decompression, Left L3-4, L4-5 Microdiscectomy   POSTERIOR CERVICAL FUSION/FORAMINOTOMY  12/19/2011   Procedure: POSTERIOR CERVICAL FUSION/FORAMINOTOMY LEVEL 2;  Surgeon: Marybelle Killings, MD;  Location: [place];  Service: Orthopedics;  Laterality: N/A;  Posterior Cervical Fusion, C4-5, C6-7 Wiring, Vitoss, Iliac aspirate   TONSILLECTOMY     Social History   Occupational History   Not on file  Tobacco Use   Smoking status: Former    Packs/day: 1.00    Years: 40.00    Pack years: 40.00    Types: Cigarettes   Smokeless tobacco: Never   Tobacco comments:    Encouraged to remain smoke free.  Substance and Sexual Activity   Alcohol use: Yes    Comment: occ   Drug use: No   Sexual activity: Not on file

## 2021-11-25 NOTE — Progress Notes (Signed)
Pt state lower back pain that travels down his right outer shin area and leg. Pt state bending and laying down makes the pain worse. Pt state he takes pain meds to help ease his pain. Pt has hx of inj on 04/25/21 pt state helped but not for long.  Numeric Pain Rating Scale and Functional Assessment Average Pain 9 Pain Right Now 6 My pain is intermittent, burning, and dull Pain is worse with: bending and laying down Pain improves with: medication   In the last MONTH (on 0-10 scale) has pain interfered with the following?  1. General activity like being  able to carry out your everyday physical activities such as walking, climbing stairs, carrying groceries, or moving a chair?  Rating(7)  2. Relation with others like being able to carry out your usual social activities and roles such as  activities at home, at work and in your community. Rating(8)  3. Enjoyment of life such that you have  been bothered by emotional problems such as feeling anxious, depressed or irritable?  Rating(9)

## 2021-11-26 DIAGNOSIS — H353131 Nonexudative age-related macular degeneration, bilateral, early dry stage: Secondary | ICD-10-CM | POA: Diagnosis not present

## 2021-12-05 ENCOUNTER — Ambulatory Visit (INDEPENDENT_AMBULATORY_CARE_PROVIDER_SITE_OTHER): Payer: Medicare Other | Admitting: Physical Medicine and Rehabilitation

## 2021-12-05 ENCOUNTER — Encounter: Payer: Self-pay | Admitting: Physical Medicine and Rehabilitation

## 2021-12-05 ENCOUNTER — Other Ambulatory Visit: Payer: Self-pay

## 2021-12-05 ENCOUNTER — Ambulatory Visit: Payer: Self-pay

## 2021-12-05 VITALS — BP 104/64 | HR 83

## 2021-12-05 DIAGNOSIS — M47816 Spondylosis without myelopathy or radiculopathy, lumbar region: Secondary | ICD-10-CM

## 2021-12-05 MED ORDER — METHYLPREDNISOLONE ACETATE 80 MG/ML IJ SUSP
80.0000 mg | Freq: Once | INTRAMUSCULAR | Status: AC
  Administered 2021-12-05: 80 mg

## 2021-12-05 NOTE — Progress Notes (Signed)
Pt state lower back pain that travels down his right outer shin area and leg. Pt state bending and laying down makes the pain worse. Pt state he takes pain meds to help ease his pain.  Numeric Pain Rating Scale and Functional Assessment Average Pain 5   In the last MONTH (on 0-10 scale) has pain interfered with the following?  1. General activity like being  able to carry out your everyday physical activities such as walking, climbing stairs, carrying groceries, or moving a chair?  Rating(8)   +Driver, -BT, -Dye Allergies.

## 2021-12-05 NOTE — Patient Instructions (Signed)

## 2021-12-08 NOTE — Procedures (Signed)
Lumbar Facet Joint Intra-Articular Injection(s) with Fluoroscopic Guidance  Patient: Randall [name]      Date of Birth: 1946/06/03 MRN: [id number] PCP: Hulan Fess, MD      Visit Date: 12/05/2021   Universal Protocol:    Date/Time: 12/05/2021  Consent Given By: the patient  Position: PRONE   Additional Comments: Vital signs were monitored before and after the procedure. Patient was prepped and draped in the usual sterile fashion. The correct patient, procedure, and site was verified.   Injection Procedure Details:  Procedure Site One Meds Administered:  Meds ordered this encounter  Medications   methylPREDNISolone acetate (DEPO-MEDROL) injection 80 mg     Laterality: Bilateral  Location/Site:  L3-L4  Needle size: 22 guage  Needle type: Spinal  Needle Placement: Articular  Findings:  -Comments: Excellent flow of contrast producing a partial arthrogram.  Procedure Details: The fluoroscope beam is vertically oriented in AP, and the inferior recess is visualized beneath the lower pole of the inferior apophyseal process, which represents the target point for needle insertion. When direct visualization is difficult the target point is located at the medial projection of the vertebral pedicle. The region overlying each aforementioned target is locally anesthetized with a 1 to 2 ml. volume of 1% Lidocaine without Epinephrine.   The spinal needle was inserted into each of the above mentioned facet joints using biplanar fluoroscopic guidance. A 0.25 to 0.5 ml. volume of Isovue-250 was injected and a partial facet joint arthrogram was obtained. A single spot film was obtained of the resulting arthrogram.    One to 1.25 ml of the steroid/anesthetic solution was then injected into each of the facet joints noted above.   Additional Comments:  No complications occurred Dressing: 2 x 2 sterile gauze and Band-Aid    Post-procedure details: Patient was observed during the  procedure. Post-procedure instructions were reviewed.  Patient left the clinic in stable condition.

## 2021-12-08 NOTE — Progress Notes (Signed)
Randall [name] - 76 y.o. male MRN [id number]  Date of birth: 1946-08-08  Office Visit Note: Visit Date: 12/05/2021 PCP: Hulan Fess, MD Referred by: Hulan Fess, MD  Subjective: Chief Complaint  Patient presents with   Lower Back - Pain   Right Leg - Pain   HPI:  Randall [name] is a 76 y.o. male who comes in today for planned repeat Bilateral L3-4 Lumbar facet/medial branch block with fluoroscopic guidance.  The patient has failed conservative care including home exercise, medications, time and activity modification.  This injection will be diagnostic and hopefully therapeutic.  Please see requesting physician notes for further details and justification.  Exam shows concordant low back pain with facet joint loading and extension. Patient received more than 80% pain relief from prior injection.  Please see prior notes to justify why we are doing intra-articular injections but briefly prior medial branch blocks were successful and prior radiofrequency ablation was not as successful in the past.  Intra-articular injections have helped him for more than 3 months at a time.    Referring:Megan Jimmye Norman, FNP   ROS Otherwise per HPI.  Assessment & Plan: Visit Diagnoses:    ICD-10-CM   1. Spondylosis without myelopathy or radiculopathy, lumbar region  M47.816 XR C-ARM NO REPORT    Facet Injection    methylPREDNISolone acetate (DEPO-MEDROL) injection 80 mg      Plan: No additional findings.   Meds & Orders:  Meds ordered this encounter  Medications   methylPREDNISolone acetate (DEPO-MEDROL) injection 80 mg    Orders Placed This Encounter  Procedures   Facet Injection   XR C-ARM NO REPORT    Follow-up: Return if symptoms worsen or fail to improve.   Procedures: No procedures performed  Lumbar Facet Joint Intra-Articular Injection(s) with Fluoroscopic Guidance  Patient: Randall [name]      Date of Birth: 02/08/1946 MRN: [id number] PCP: Hulan Fess, MD      Visit Date:  12/05/2021   Universal Protocol:    Date/Time: 12/05/2021  Consent Given By: the patient  Position: PRONE   Additional Comments: Vital signs were monitored before and after the procedure. Patient was prepped and draped in the usual sterile fashion. The correct patient, procedure, and site was verified.   Injection Procedure Details:  Procedure Site One Meds Administered:  Meds ordered this encounter  Medications   methylPREDNISolone acetate (DEPO-MEDROL) injection 80 mg     Laterality: Bilateral  Location/Site:  L3-L4  Needle size: 22 guage  Needle type: Spinal  Needle Placement: Articular  Findings:  -Comments: Excellent flow of contrast producing a partial arthrogram.  Procedure Details: The fluoroscope beam is vertically oriented in AP, and the inferior recess is visualized beneath the lower pole of the inferior apophyseal process, which represents the target point for needle insertion. When direct visualization is difficult the target point is located at the medial projection of the vertebral pedicle. The region overlying each aforementioned target is locally anesthetized with a 1 to 2 ml. volume of 1% Lidocaine without Epinephrine.   The spinal needle was inserted into each of the above mentioned facet joints using biplanar fluoroscopic guidance. A 0.25 to 0.5 ml. volume of Isovue-250 was injected and a partial facet joint arthrogram was obtained. A single spot film was obtained of the resulting arthrogram.    One to 1.25 ml of the steroid/anesthetic solution was then injected into each of the facet joints noted above.   Additional Comments:  No complications occurred Dressing:  2 x 2 sterile gauze and Band-Aid    Post-procedure details: Patient was observed during the procedure. Post-procedure instructions were reviewed.  Patient left the clinic in stable condition.     Clinical History: MRI LUMBAR SPINE WITHOUT CONTRAST   TECHNIQUE: Multiplanar,  multisequence MR imaging of the lumbar spine was performed. No intravenous contrast was administered.   COMPARISON:  MRI of the lumbar spine 02/19/2018   FINDINGS: Segmentation: 5 non rib-bearing lumbar type vertebral bodies are present. The lowest fully formed vertebral body is L5.   Alignment: No significant listhesis is present. Mild straightening of the normal lumbar lordosis is stable.   Vertebrae: Mild endplate marrow changes are present posteriorly at L4-5. Marrow signal and vertebral body heights are otherwise normal.   Conus medullaris and cauda equina: Conus extends to the T12-L1 level. Conus and cauda equina appear normal.   Paraspinal and other soft tissues: Bilateral renal cystic change again noted, left greater than right. No new lesions are present. No significant adenopathy is present.   Disc levels:   T12-L1: Negative.   L1-2: Mild disc bulging and facet hypertrophy is present without significant stenosis or change.   L2-3: Leftward disc bulging is present. Mild facet hypertrophy bilaterally is stable.   L3-4: A broad-based disc protrusion is again noted. Progressive subarticular narrowing is present, left greater than right. Moderate bilateral foraminal stenosis is similar the prior exam, left greater than right.   L4-5: Wide laminectomy decompresses the central canal. Moderate foraminal narrowing bilaterally is similar the prior study.   L5-S1: Negative.   IMPRESSION: 1. Progressive subarticular narrowing at L3-4 is worse on the left despite laminectomy. 2. Moderate bilateral foraminal stenosis at L3-4 is similar the prior exam, left greater than right. 3. Wide laminectomy at L4-5 with stable moderate foraminal narrowing bilaterally. 4. Leftward disc bulging and facet hypertrophy at L1-2 and L2-3 without significant stenosis or change.     Electronically Signed   By: San Morelle M.D.   On: 03/18/2021 14:44     Objective:  VS:  HT:      WT:    BMI:      BP:104/64   HR:83bpm   TEMP: ( )   RESP:  Physical Exam Vitals and nursing note reviewed.  Constitutional:      General: He is not in acute distress.    Appearance: Normal appearance. He is not ill-appearing.  HENT:     Head: Normocephalic and atraumatic.     Right Ear: External ear normal.     Left Ear: External ear normal.     Nose: No congestion.  Eyes:     Extraocular Movements: Extraocular movements intact.  Cardiovascular:     Rate and Rhythm: Normal rate.     Pulses: Normal pulses.  Pulmonary:     Effort: Pulmonary effort is normal. No respiratory distress.  Abdominal:     General: There is no distension.     Palpations: Abdomen is soft.  Musculoskeletal:        General: No tenderness or signs of injury.     Cervical back: Neck supple.     Right lower leg: No edema.     Left lower leg: No edema.     Comments: Patient has good distal strength without clonus. Patient somewhat slow to rise from a seated position to full extension.  There is concordant low back pain with facet loading and lumbar spine extension rotation.  There are no definitive trigger points but the patient  is somewhat tender across the lower back and PSIS.  There is no pain with hip rotation.   Skin:    Findings: No erythema or rash.  Neurological:     General: No focal deficit present.     Mental Status: He is alert and oriented to person, place, and time.     Sensory: No sensory deficit.     Motor: No weakness or abnormal muscle tone.     Coordination: Coordination normal.  Psychiatric:        Mood and Affect: Mood normal.        Behavior: Behavior normal.     Imaging: No results found.

## 2021-12-17 DIAGNOSIS — N401 Enlarged prostate with lower urinary tract symptoms: Secondary | ICD-10-CM | POA: Diagnosis not present

## 2021-12-24 DIAGNOSIS — R3915 Urgency of urination: Secondary | ICD-10-CM | POA: Diagnosis not present

## 2021-12-24 DIAGNOSIS — N401 Enlarged prostate with lower urinary tract symptoms: Secondary | ICD-10-CM | POA: Diagnosis not present

## 2021-12-30 ENCOUNTER — Other Ambulatory Visit: Payer: Self-pay | Admitting: Physical Medicine and Rehabilitation

## 2021-12-30 DIAGNOSIS — M25551 Pain in right hip: Secondary | ICD-10-CM

## 2022-01-01 ENCOUNTER — Other Ambulatory Visit: Payer: Self-pay | Admitting: Physical Medicine and Rehabilitation

## 2022-01-01 ENCOUNTER — Telehealth: Payer: Self-pay | Admitting: Physical Medicine and Rehabilitation

## 2022-01-01 DIAGNOSIS — M25551 Pain in right hip: Secondary | ICD-10-CM

## 2022-01-01 MED ORDER — TRAMADOL HCL 50 MG PO TABS: 50.0000 mg | ORAL_TABLET | Freq: Two times a day (BID) | ORAL | 0 refills | Status: DC | PRN

## 2022-01-01 NOTE — Telephone Encounter (Signed)
Pt called about update on refill of tramadol. Please call pt at [phone]. Please send refill to pharmacy on file ?

## 2022-01-28 ENCOUNTER — Other Ambulatory Visit: Payer: Self-pay | Admitting: Physical Medicine and Rehabilitation

## 2022-01-28 DIAGNOSIS — M25551 Pain in right hip: Secondary | ICD-10-CM

## 2022-02-03 ENCOUNTER — Telehealth: Payer: Self-pay | Admitting: Physical Medicine and Rehabilitation

## 2022-02-03 ENCOUNTER — Other Ambulatory Visit: Payer: Self-pay | Admitting: Physical Medicine and Rehabilitation

## 2022-02-03 DIAGNOSIS — M25551 Pain in right hip: Secondary | ICD-10-CM

## 2022-02-03 MED ORDER — TRAMADOL HCL 50 MG PO TABS: 50.0000 mg | ORAL_TABLET | Freq: Two times a day (BID) | ORAL | 0 refills | Status: DC | PRN

## 2022-02-03 NOTE — Telephone Encounter (Signed)
Pt called again

## 2022-02-03 NOTE — Telephone Encounter (Signed)
Pt is calling back again

## 2022-02-03 NOTE — Progress Notes (Signed)
Prescription for Tramadol refilled.  ?

## 2022-02-03 NOTE — Telephone Encounter (Signed)
Pt called stating his pharmacy need pre auth for tramadol. Please send pre auth to pharmacy. Pt phone number is [phone] ?

## 2022-03-03 ENCOUNTER — Other Ambulatory Visit: Payer: Self-pay | Admitting: Physical Medicine and Rehabilitation

## 2022-03-03 DIAGNOSIS — M25551 Pain in right hip: Secondary | ICD-10-CM

## 2022-03-04 DIAGNOSIS — N401 Enlarged prostate with lower urinary tract symptoms: Secondary | ICD-10-CM | POA: Diagnosis not present

## 2022-03-04 DIAGNOSIS — R3915 Urgency of urination: Secondary | ICD-10-CM | POA: Diagnosis not present

## 2022-03-04 DIAGNOSIS — R350 Frequency of micturition: Secondary | ICD-10-CM | POA: Diagnosis not present

## 2022-03-04 DIAGNOSIS — R351 Nocturia: Secondary | ICD-10-CM | POA: Diagnosis not present

## 2022-03-05 ENCOUNTER — Other Ambulatory Visit: Payer: Self-pay | Admitting: Physical Medicine and Rehabilitation

## 2022-03-05 DIAGNOSIS — M25551 Pain in right hip: Secondary | ICD-10-CM

## 2022-03-05 MED ORDER — TRAMADOL HCL 50 MG PO TABS: 50.0000 mg | ORAL_TABLET | Freq: Two times a day (BID) | ORAL | 0 refills | Status: DC | PRN

## 2022-03-05 NOTE — Progress Notes (Signed)
Tramadol refilled.

## 2022-03-05 NOTE — Telephone Encounter (Signed)
Patient called in needing approval for pharmacy to refill Tramadol ?

## 2022-03-17 ENCOUNTER — Telehealth: Payer: Self-pay | Admitting: Physical Medicine and Rehabilitation

## 2022-03-17 NOTE — Telephone Encounter (Signed)
Patient called in stating he was moving some furniture 3 days ago and has hurt his back, he would like to come in as soon as possible

## 2022-03-20 ENCOUNTER — Telehealth: Payer: Self-pay | Admitting: Physical Medicine and Rehabilitation

## 2022-03-20 NOTE — Telephone Encounter (Signed)
Pt called requesting a call back. Pt is asking for a sooner appt or put on cancellation list. Please call pt at [phone].

## 2022-03-26 DIAGNOSIS — H353131 Nonexudative age-related macular degeneration, bilateral, early dry stage: Secondary | ICD-10-CM | POA: Diagnosis not present

## 2022-04-02 ENCOUNTER — Other Ambulatory Visit: Payer: Self-pay | Admitting: Physical Medicine and Rehabilitation

## 2022-04-02 DIAGNOSIS — M25551 Pain in right hip: Secondary | ICD-10-CM

## 2022-04-03 ENCOUNTER — Other Ambulatory Visit: Payer: Self-pay | Admitting: Physical Medicine and Rehabilitation

## 2022-04-03 ENCOUNTER — Telehealth: Payer: Self-pay | Admitting: Physical Medicine and Rehabilitation

## 2022-04-03 DIAGNOSIS — M25551 Pain in right hip: Secondary | ICD-10-CM

## 2022-04-03 MED ORDER — TRAMADOL HCL 50 MG PO TABS: 50.0000 mg | ORAL_TABLET | Freq: Two times a day (BID) | ORAL | 0 refills | Status: DC | PRN

## 2022-04-03 NOTE — Telephone Encounter (Signed)
Pt calling for a refill on tramadol

## 2022-04-08 ENCOUNTER — Encounter: Payer: Self-pay | Admitting: Physical Medicine and Rehabilitation

## 2022-04-08 ENCOUNTER — Ambulatory Visit (INDEPENDENT_AMBULATORY_CARE_PROVIDER_SITE_OTHER): Payer: Medicare Other | Admitting: Physical Medicine and Rehabilitation

## 2022-04-08 ENCOUNTER — Ambulatory Visit: Payer: Medicare Other

## 2022-04-08 DIAGNOSIS — M47816 Spondylosis without myelopathy or radiculopathy, lumbar region: Secondary | ICD-10-CM | POA: Diagnosis not present

## 2022-04-08 MED ORDER — METHYLPREDNISOLONE ACETATE 80 MG/ML IJ SUSP
80.0000 mg | Freq: Once | INTRAMUSCULAR | Status: AC
  Administered 2022-04-08: 80 mg

## 2022-04-08 NOTE — Patient Instructions (Signed)

## 2022-04-08 NOTE — Progress Notes (Unsigned)
Pt state lower back pain that travels down his right outer shin area and leg. Pt state bending and laying down makes the pain worse. Pt state he takes pain meds to help ease his pain.  Numeric Pain Rating Scale and Functional Assessment Average Pain 5   In the last MONTH (on 0-10 scale) has pain interfered with the following?  1. General activity like being  able to carry out your everyday physical activities such as walking, climbing stairs, carrying groceries, or moving a chair?  Rating(9)   +Driver, -BT, -Dye Allergies.

## 2022-04-16 DIAGNOSIS — D120 Benign neoplasm of cecum: Secondary | ICD-10-CM | POA: Diagnosis not present

## 2022-04-16 DIAGNOSIS — K5730 Diverticulosis of large intestine without perforation or abscess without bleeding: Secondary | ICD-10-CM | POA: Diagnosis not present

## 2022-04-16 DIAGNOSIS — D122 Benign neoplasm of ascending colon: Secondary | ICD-10-CM | POA: Diagnosis not present

## 2022-04-16 DIAGNOSIS — Z86010 Personal history of colonic polyps: Secondary | ICD-10-CM | POA: Diagnosis not present

## 2022-04-16 DIAGNOSIS — Z09 Encounter for follow-up examination after completed treatment for conditions other than malignant neoplasm: Secondary | ICD-10-CM | POA: Diagnosis not present

## 2022-04-16 DIAGNOSIS — D123 Benign neoplasm of transverse colon: Secondary | ICD-10-CM | POA: Diagnosis not present

## 2022-04-16 DIAGNOSIS — D125 Benign neoplasm of sigmoid colon: Secondary | ICD-10-CM | POA: Diagnosis not present

## 2022-04-18 DIAGNOSIS — D123 Benign neoplasm of transverse colon: Secondary | ICD-10-CM | POA: Diagnosis not present

## 2022-04-18 DIAGNOSIS — D125 Benign neoplasm of sigmoid colon: Secondary | ICD-10-CM | POA: Diagnosis not present

## 2022-04-18 DIAGNOSIS — D120 Benign neoplasm of cecum: Secondary | ICD-10-CM | POA: Diagnosis not present

## 2022-04-18 DIAGNOSIS — D122 Benign neoplasm of ascending colon: Secondary | ICD-10-CM | POA: Diagnosis not present

## 2022-04-22 NOTE — Progress Notes (Signed)
Randall [name] - 76 y.o. male MRN [id number]  Date of birth: 1946-06-26  Office Visit Note: Visit Date: 04/08/2022 PCP: Catha Gosselin, MD Referred by: Catha Gosselin, MD  Subjective: Chief Complaint  Patient presents with   Lower Back - Pain   Right Leg - Pain   HPI:  Randall [name] is a 76 y.o. male who comes in today for planned repeat Bilateral L3-4 Lumbar facet/medial branch block with fluoroscopic guidance.  The patient has failed conservative care including home exercise, medications, time and activity modification.  This injection will be diagnostic and hopefully therapeutic.  Please see requesting physician notes for further details and justification.  Exam shows concordant low back pain with facet joint loading and extension. Patient received more than 80% pain relief from prior injection.     Referring:Megan Mayford Knife, FNP   ROS Otherwise per HPI.  Assessment & Plan: Visit Diagnoses:    ICD-10-CM   1. Spondylosis without myelopathy or radiculopathy, lumbar region  M47.816 XR C-ARM NO REPORT    Facet Injection    methylPREDNISolone acetate (DEPO-MEDROL) injection 80 mg      Plan: No additional findings.   Meds & Orders:  Meds ordered this encounter  Medications   methylPREDNISolone acetate (DEPO-MEDROL) injection 80 mg    Orders Placed This Encounter  Procedures   Facet Injection   XR C-ARM NO REPORT    Follow-up: Return if symptoms worsen or fail to improve.   Procedures: No procedures performed  Lumbar Facet Joint Intra-Articular Injection(s) with Fluoroscopic Guidance  Patient: Randall [name]      Date of Birth: 1946/06/06 MRN: [id number] PCP: Catha Gosselin, MD      Visit Date: 04/08/2022   Universal Protocol:    Date/Time: 04/08/2022  Consent Given By: the patient  Position: PRONE   Additional Comments: Vital signs were monitored before and after the procedure. Patient was prepped and draped in the usual sterile fashion. The correct patient,  procedure, and site was verified.   Injection Procedure Details:  Procedure Site One Meds Administered:  Meds ordered this encounter  Medications   methylPREDNISolone acetate (DEPO-MEDROL) injection 80 mg     Laterality: Bilateral  Location/Site:  L3-L4  Needle size: 22 guage  Needle type: Spinal  Needle Placement: Articular  Findings:  -Comments: Excellent flow of contrast producing a partial arthrogram.  Procedure Details: The fluoroscope beam is vertically oriented in AP, and the inferior recess is visualized beneath the lower pole of the inferior apophyseal process, which represents the target point for needle insertion. When direct visualization is difficult the target point is located at the medial projection of the vertebral pedicle. The region overlying each aforementioned target is locally anesthetized with a 1 to 2 ml. volume of 1% Lidocaine without Epinephrine.   The spinal needle was inserted into each of the above mentioned facet joints using biplanar fluoroscopic guidance. A 0.25 to 0.5 ml. volume of Isovue-250 was injected and a partial facet joint arthrogram was obtained. A single spot film was obtained of the resulting arthrogram.    One to 1.25 ml of the steroid/anesthetic solution was then injected into each of the facet joints noted above.   Additional Comments:  The patient tolerated the procedure well Dressing: 2 x 2 sterile gauze and Band-Aid    Post-procedure details: Patient was observed during the procedure. Post-procedure instructions were reviewed.  Patient left the clinic in stable condition.    Clinical History: MRI LUMBAR SPINE WITHOUT CONTRAST  TECHNIQUE:  Multiplanar, multisequence MR imaging of the lumbar spine was  performed. No intravenous contrast was administered.     COMPARISON:  MRI of the lumbar spine 02/19/2018     FINDINGS:  Segmentation: 5 non rib-bearing lumbar type vertebral bodies are  present. The lowest fully  formed vertebral body is L5.     Alignment: No significant listhesis is present. Mild straightening  of the normal lumbar lordosis is stable.     Vertebrae: Mild endplate marrow changes are present posteriorly at  L4-5. Marrow signal and vertebral body heights are otherwise normal.     Conus medullaris and cauda equina: Conus extends to the T12-L1  level. Conus and cauda equina appear normal.     Paraspinal and other soft tissues: Bilateral renal cystic change  again noted, left greater than right. No new lesions are present. No  significant adenopathy is present.     Disc levels:     T12-L1: Negative.     L1-2: Mild disc bulging and facet hypertrophy is present without  significant stenosis or change.     L2-3: Leftward disc bulging is present. Mild facet hypertrophy  bilaterally is stable.     L3-4: A broad-based disc protrusion is again noted. Progressive  subarticular narrowing is present, left greater than right. Moderate  bilateral foraminal stenosis is similar the prior exam, left greater  than right.     L4-5: Wide laminectomy decompresses the central canal. Moderate  foraminal narrowing bilaterally is similar the prior study.     L5-S1: Negative.     IMPRESSION:  1. Progressive subarticular narrowing at L3-4 is worse on the left  despite laminectomy.  2. Moderate bilateral foraminal stenosis at L3-4 is similar the  prior exam, left greater than right.  3. Wide laminectomy at L4-5 with stable moderate foraminal narrowing  bilaterally.  4. Leftward disc bulging and facet hypertrophy at L1-2 and L2-3  without significant stenosis or change.        Electronically Signed    By: Marin Roberts M.D.    On: 03/18/2021 14:44     Objective:  VS:  HT:    WT:   BMI:     BP:   HR: bpm  TEMP: ( )  RESP:  Physical Exam Vitals and nursing note reviewed.  Constitutional:      General: He is not in acute distress.    Appearance: Normal appearance. He is not  ill-appearing.  HENT:     Head: Normocephalic and atraumatic.     Right Ear: External ear normal.     Left Ear: External ear normal.     Nose: No congestion.  Eyes:     Extraocular Movements: Extraocular movements intact.  Cardiovascular:     Rate and Rhythm: Normal rate.     Pulses: Normal pulses.  Pulmonary:     Effort: Pulmonary effort is normal. No respiratory distress.  Abdominal:     General: There is no distension.     Palpations: Abdomen is soft.  Musculoskeletal:        General: No tenderness or signs of injury.     Cervical back: Neck supple.     Right lower leg: No edema.     Left lower leg: No edema.     Comments: Patient has good distal strength without clonus.  Skin:    Findings: No erythema or rash.  Neurological:     General: No focal deficit present.     Mental Status: He  is alert and oriented to person, place, and time.     Sensory: No sensory deficit.     Motor: No weakness or abnormal muscle tone.     Coordination: Coordination normal.  Psychiatric:        Mood and Affect: Mood normal.        Behavior: Behavior normal.      Imaging: No results found.

## 2022-04-30 ENCOUNTER — Other Ambulatory Visit: Payer: Self-pay | Admitting: Physical Medicine and Rehabilitation

## 2022-04-30 DIAGNOSIS — M25551 Pain in right hip: Secondary | ICD-10-CM

## 2022-05-02 ENCOUNTER — Telehealth: Payer: Self-pay | Admitting: Physical Medicine and Rehabilitation

## 2022-05-02 NOTE — Telephone Encounter (Signed)
Pt called requesting a refill of tramadol. Please send to pharmacy on file. Pt phone number is [phone]

## 2022-05-05 ENCOUNTER — Other Ambulatory Visit: Payer: Self-pay | Admitting: Physical Medicine and Rehabilitation

## 2022-05-05 DIAGNOSIS — M25551 Pain in right hip: Secondary | ICD-10-CM

## 2022-05-05 MED ORDER — TRAMADOL HCL 50 MG PO TABS: 50.0000 mg | ORAL_TABLET | Freq: Two times a day (BID) | ORAL | 0 refills | Status: DC | PRN

## 2022-05-05 NOTE — Telephone Encounter (Signed)
Pt requesting refill

## 2022-06-02 ENCOUNTER — Other Ambulatory Visit: Payer: Self-pay | Admitting: Physical Medicine and Rehabilitation

## 2022-06-02 DIAGNOSIS — M25551 Pain in right hip: Secondary | ICD-10-CM

## 2022-06-04 ENCOUNTER — Telehealth: Payer: Self-pay | Admitting: Physical Medicine and Rehabilitation

## 2022-06-04 DIAGNOSIS — M25551 Pain in right hip: Secondary | ICD-10-CM

## 2022-06-04 MED ORDER — TRAMADOL HCL 50 MG PO TABS: 50.0000 mg | ORAL_TABLET | Freq: Two times a day (BID) | ORAL | 0 refills | Status: DC | PRN

## 2022-06-04 NOTE — Telephone Encounter (Signed)
Patient called. He would like a refill on Tramadol. His call back number is [phone]

## 2022-07-02 ENCOUNTER — Other Ambulatory Visit: Payer: Self-pay | Admitting: Physical Medicine and Rehabilitation

## 2022-07-02 DIAGNOSIS — M25551 Pain in right hip: Secondary | ICD-10-CM

## 2022-07-07 ENCOUNTER — Other Ambulatory Visit: Payer: Self-pay | Admitting: Physical Medicine and Rehabilitation

## 2022-07-08 DIAGNOSIS — J449 Chronic obstructive pulmonary disease, unspecified: Secondary | ICD-10-CM | POA: Diagnosis not present

## 2022-07-08 DIAGNOSIS — E785 Hyperlipidemia, unspecified: Secondary | ICD-10-CM | POA: Diagnosis not present

## 2022-07-08 DIAGNOSIS — I10 Essential (primary) hypertension: Secondary | ICD-10-CM | POA: Diagnosis not present

## 2022-07-08 DIAGNOSIS — N401 Enlarged prostate with lower urinary tract symptoms: Secondary | ICD-10-CM | POA: Diagnosis not present

## 2022-07-18 DIAGNOSIS — R351 Nocturia: Secondary | ICD-10-CM | POA: Diagnosis not present

## 2022-07-18 DIAGNOSIS — N401 Enlarged prostate with lower urinary tract symptoms: Secondary | ICD-10-CM | POA: Diagnosis not present

## 2022-07-31 ENCOUNTER — Other Ambulatory Visit: Payer: Self-pay | Admitting: Physical Medicine and Rehabilitation

## 2022-07-31 DIAGNOSIS — M25551 Pain in right hip: Secondary | ICD-10-CM

## 2022-08-01 DIAGNOSIS — Z23 Encounter for immunization: Secondary | ICD-10-CM | POA: Diagnosis not present

## 2022-08-28 ENCOUNTER — Other Ambulatory Visit: Payer: Self-pay | Admitting: Physical Medicine and Rehabilitation

## 2022-08-28 DIAGNOSIS — M25551 Pain in right hip: Secondary | ICD-10-CM

## 2022-09-29 ENCOUNTER — Other Ambulatory Visit: Payer: Self-pay | Admitting: Physical Medicine and Rehabilitation

## 2022-09-29 DIAGNOSIS — M25551 Pain in right hip: Secondary | ICD-10-CM

## 2022-10-29 ENCOUNTER — Other Ambulatory Visit: Payer: Self-pay | Admitting: Physical Medicine and Rehabilitation

## 2022-10-29 ENCOUNTER — Ambulatory Visit (HOSPITAL_COMMUNITY): Admission: RE | Admit: 2022-10-29 | Payer: Medicare Other | Source: Ambulatory Visit

## 2022-10-29 DIAGNOSIS — M25551 Pain in right hip: Secondary | ICD-10-CM

## 2022-10-30 ENCOUNTER — Telehealth: Payer: Self-pay | Admitting: Physical Medicine and Rehabilitation

## 2022-10-30 NOTE — Telephone Encounter (Signed)
Pt called stating he has a pending approval for injection and need to give [place] J new insurance information. Pt phone number is [phone].

## 2022-10-31 ENCOUNTER — Other Ambulatory Visit: Payer: Self-pay | Admitting: Physical Medicine and Rehabilitation

## 2022-10-31 ENCOUNTER — Telehealth: Payer: Self-pay | Admitting: Physical Medicine and Rehabilitation

## 2022-10-31 DIAGNOSIS — M25551 Pain in right hip: Secondary | ICD-10-CM

## 2022-10-31 MED ORDER — TRAMADOL HCL 50 MG PO TABS: ORAL_TABLET | ORAL | 0 refills | Status: DC

## 2022-10-31 NOTE — Telephone Encounter (Signed)
Patient states he has new insurance and he needs new rx and wants someone to call him..[phone]

## 2022-10-31 NOTE — Telephone Encounter (Signed)
I tried calling to discuss. No answer. Sending back to you for follow up

## 2022-10-31 NOTE — Telephone Encounter (Signed)
I called. [employer] supplement information has been updated.

## 2022-11-06 ENCOUNTER — Ambulatory Visit (HOSPITAL_COMMUNITY)
Admission: RE | Admit: 2022-11-06 | Discharge: 2022-11-06 | Disposition: A | Discharge status: Home / Self Care | Payer: Medicare Other | Source: Ambulatory Visit | Attending: Cardiovascular Disease | Admitting: Cardiovascular Disease

## 2022-11-06 DIAGNOSIS — I7143 Infrarenal abdominal aortic aneurysm, without rupture: Secondary | ICD-10-CM | POA: Diagnosis not present

## 2022-11-07 ENCOUNTER — Other Ambulatory Visit: Payer: Self-pay

## 2022-11-07 DIAGNOSIS — I7143 Infrarenal abdominal aortic aneurysm, without rupture: Secondary | ICD-10-CM

## 2022-11-12 ENCOUNTER — Other Ambulatory Visit: Payer: Self-pay

## 2022-11-12 DIAGNOSIS — I7143 Infrarenal abdominal aortic aneurysm, without rupture: Secondary | ICD-10-CM

## 2022-11-17 DIAGNOSIS — Z1389 Encounter for screening for other disorder: Secondary | ICD-10-CM | POA: Diagnosis not present

## 2022-11-17 DIAGNOSIS — Z0000 Encounter for general adult medical examination without abnormal findings: Secondary | ICD-10-CM | POA: Diagnosis not present

## 2022-11-17 DIAGNOSIS — Z6833 Body mass index (BMI) 33.0-33.9, adult: Secondary | ICD-10-CM | POA: Diagnosis not present

## 2022-11-26 ENCOUNTER — Other Ambulatory Visit: Payer: Self-pay | Admitting: Physical Medicine and Rehabilitation

## 2022-11-26 DIAGNOSIS — M25551 Pain in right hip: Secondary | ICD-10-CM

## 2022-12-01 ENCOUNTER — Telehealth: Payer: Self-pay | Admitting: Physical Medicine and Rehabilitation

## 2022-12-01 ENCOUNTER — Other Ambulatory Visit: Payer: Self-pay | Admitting: Physical Medicine and Rehabilitation

## 2022-12-01 DIAGNOSIS — M25551 Pain in right hip: Secondary | ICD-10-CM

## 2022-12-01 MED ORDER — TRAMADOL HCL 50 MG PO TABS: ORAL_TABLET | ORAL | 0 refills | Status: DC

## 2022-12-01 NOTE — Telephone Encounter (Signed)
Patient called asked if he can get a call back. Patient said the pharmacy need a call before he can get his Rx for Tramadol. (Prior approval)  I advised patient [place] or Jinny Blossom will give him a call back.  The number to contact patient is [phone]

## 2022-12-01 NOTE — Telephone Encounter (Signed)
Spoke with pharmacy to make sure they received Tramadol. They will fill prescription

## 2022-12-18 DIAGNOSIS — D124 Benign neoplasm of descending colon: Secondary | ICD-10-CM | POA: Diagnosis not present

## 2022-12-18 DIAGNOSIS — Z86010 Personal history of colonic polyps: Secondary | ICD-10-CM | POA: Diagnosis not present

## 2022-12-18 DIAGNOSIS — K5730 Diverticulosis of large intestine without perforation or abscess without bleeding: Secondary | ICD-10-CM | POA: Diagnosis not present

## 2022-12-18 DIAGNOSIS — Z09 Encounter for follow-up examination after completed treatment for conditions other than malignant neoplasm: Secondary | ICD-10-CM | POA: Diagnosis not present

## 2022-12-18 DIAGNOSIS — D120 Benign neoplasm of cecum: Secondary | ICD-10-CM | POA: Diagnosis not present

## 2022-12-18 DIAGNOSIS — D123 Benign neoplasm of transverse colon: Secondary | ICD-10-CM | POA: Diagnosis not present

## 2022-12-18 DIAGNOSIS — D122 Benign neoplasm of ascending colon: Secondary | ICD-10-CM | POA: Diagnosis not present

## 2022-12-22 DIAGNOSIS — D122 Benign neoplasm of ascending colon: Secondary | ICD-10-CM | POA: Diagnosis not present

## 2022-12-22 DIAGNOSIS — D123 Benign neoplasm of transverse colon: Secondary | ICD-10-CM | POA: Diagnosis not present

## 2022-12-22 DIAGNOSIS — D124 Benign neoplasm of descending colon: Secondary | ICD-10-CM | POA: Diagnosis not present

## 2022-12-22 DIAGNOSIS — D120 Benign neoplasm of cecum: Secondary | ICD-10-CM | POA: Diagnosis not present

## 2022-12-29 ENCOUNTER — Other Ambulatory Visit: Payer: Self-pay | Admitting: Physical Medicine and Rehabilitation

## 2022-12-29 DIAGNOSIS — M25551 Pain in right hip: Secondary | ICD-10-CM

## 2022-12-30 DIAGNOSIS — H3530 Unspecified macular degeneration: Secondary | ICD-10-CM | POA: Diagnosis not present

## 2023-01-28 ENCOUNTER — Other Ambulatory Visit: Payer: Self-pay | Admitting: Physical Medicine and Rehabilitation

## 2023-01-28 DIAGNOSIS — M25551 Pain in right hip: Secondary | ICD-10-CM

## 2023-02-20 DIAGNOSIS — R3915 Urgency of urination: Secondary | ICD-10-CM | POA: Diagnosis not present

## 2023-02-20 DIAGNOSIS — N401 Enlarged prostate with lower urinary tract symptoms: Secondary | ICD-10-CM | POA: Diagnosis not present

## 2023-02-25 ENCOUNTER — Other Ambulatory Visit: Payer: Self-pay | Admitting: Physical Medicine and Rehabilitation

## 2023-02-25 DIAGNOSIS — M25551 Pain in right hip: Secondary | ICD-10-CM

## 2023-03-11 DIAGNOSIS — R278 Other lack of coordination: Secondary | ICD-10-CM | POA: Diagnosis not present

## 2023-03-11 DIAGNOSIS — R351 Nocturia: Secondary | ICD-10-CM | POA: Diagnosis not present

## 2023-03-11 DIAGNOSIS — R3915 Urgency of urination: Secondary | ICD-10-CM | POA: Diagnosis not present

## 2023-03-26 ENCOUNTER — Other Ambulatory Visit: Payer: Self-pay | Admitting: Physical Medicine and Rehabilitation

## 2023-03-26 DIAGNOSIS — M25551 Pain in right hip: Secondary | ICD-10-CM

## 2023-04-08 DIAGNOSIS — I10 Essential (primary) hypertension: Secondary | ICD-10-CM | POA: Diagnosis not present

## 2023-04-09 DIAGNOSIS — R3915 Urgency of urination: Secondary | ICD-10-CM | POA: Diagnosis not present

## 2023-04-09 DIAGNOSIS — R350 Frequency of micturition: Secondary | ICD-10-CM | POA: Diagnosis not present

## 2023-04-16 DIAGNOSIS — R350 Frequency of micturition: Secondary | ICD-10-CM | POA: Diagnosis not present

## 2023-04-16 DIAGNOSIS — R3915 Urgency of urination: Secondary | ICD-10-CM | POA: Diagnosis not present

## 2023-04-23 DIAGNOSIS — R3915 Urgency of urination: Secondary | ICD-10-CM | POA: Diagnosis not present

## 2023-04-23 DIAGNOSIS — R350 Frequency of micturition: Secondary | ICD-10-CM | POA: Diagnosis not present

## 2023-04-24 ENCOUNTER — Other Ambulatory Visit: Payer: Self-pay | Admitting: Physical Medicine and Rehabilitation

## 2023-04-24 DIAGNOSIS — M25551 Pain in right hip: Secondary | ICD-10-CM

## 2023-05-01 DIAGNOSIS — R350 Frequency of micturition: Secondary | ICD-10-CM | POA: Diagnosis not present

## 2023-05-01 DIAGNOSIS — R3915 Urgency of urination: Secondary | ICD-10-CM | POA: Diagnosis not present

## 2023-05-04 ENCOUNTER — Telehealth: Payer: Self-pay | Admitting: Physical Medicine and Rehabilitation

## 2023-05-04 NOTE — Telephone Encounter (Signed)
Patient called. Would like an appointment with Dr. Newton 

## 2023-05-05 ENCOUNTER — Telehealth: Payer: Self-pay | Admitting: Physical Medicine and Rehabilitation

## 2023-05-05 NOTE — Telephone Encounter (Signed)
See previous encounter

## 2023-05-05 NOTE — Telephone Encounter (Signed)
Spoke with patient and he is having a different type of lower back pain with radiculopathy. OV scheduled for 05/11/23

## 2023-05-05 NOTE — Telephone Encounter (Signed)
Pt called requesting a call to set an appt with Dr Alvester Morin. Pt phone number is [phone].

## 2023-05-11 ENCOUNTER — Ambulatory Visit (INDEPENDENT_AMBULATORY_CARE_PROVIDER_SITE_OTHER): Payer: Medicare Other | Admitting: Physical Medicine and Rehabilitation

## 2023-05-11 ENCOUNTER — Encounter: Payer: Self-pay | Admitting: Physical Medicine and Rehabilitation

## 2023-05-11 VITALS — BP 137/70 | HR 61 | Ht 67.0 in | Wt 180.0 lb

## 2023-05-11 DIAGNOSIS — M48061 Spinal stenosis, lumbar region without neurogenic claudication: Secondary | ICD-10-CM

## 2023-05-11 DIAGNOSIS — M5416 Radiculopathy, lumbar region: Secondary | ICD-10-CM | POA: Diagnosis not present

## 2023-05-11 DIAGNOSIS — G894 Chronic pain syndrome: Secondary | ICD-10-CM | POA: Diagnosis not present

## 2023-05-11 MED ORDER — TRAMADOL HCL 50 MG PO TABS: ORAL_TABLET | ORAL | 0 refills | Status: DC

## 2023-05-11 NOTE — Progress Notes (Unsigned)
Randall [name] - 77 y.o. male MRN [id number]  Date of birth: 1946/09/10  Office Visit Note: Visit Date: 05/11/2023 PCP: Catha Gosselin, MD Referred by: Catha Gosselin, MD  Subjective: Chief Complaint  Patient presents with   Lower Back - Pain   HPI: Randall [name] is a 77 y.o. male who comes in today for evaluation of chronic, worsening and severe bilateral lower back pain radiating to buttocks and posterior thighs down to knees. States his pain today seems very different than previous issues. Patient is well known to [place], we have seen him for many years for a combination of axial back pain and radicular symptoms. Pain worsens with standing and walking. He describes pain as sore and aching, currently rates as 8 out of 10. Reports severe pain when moving from sitting to standing position. No relief of pain with home exercise regimen and rest. He continues to take Tramadol as needed for moderate/severe pain. He reports his pain has increased significantly over the last several weeks, states he is now taking (2) tablets of 50 mg Tramadol in the morning and at bedtime. Lumbar MRI from 2022 exhibits multi-level facet hypertrophy, left subarticular and foraminal narrowing at L3-L4, wide laminectomy noted at L4-L5. No high grade spinal canal stenosis noted. Patient has a history of 2 lumbar surgeries performed by Dr. Annell Greening. We have performed both lumbar epidural steroid injections and lumbar facet joint injections over the years. Most recent was bilateral L3-L4 facet joint injections on 04/08/2022, minimal relief of pain with these injections. He reports intermittent relief of pain with lumbar epidural steroid injections. Patient denies focal weakness, numbness and tingling. No recent trauma or falls.    Review of Systems  Musculoskeletal:  Positive for back pain.  Neurological:  Negative for tingling, sensory change, focal weakness and weakness.  All other systems reviewed and are negative.   Otherwise per HPI.  Assessment & Plan: Visit Diagnoses:    ICD-10-CM   1. Lumbar radiculopathy  M54.16 Ambulatory referral to Physical Medicine Rehab    2. 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 Chronic pain syndrome  G89.4 Ambulatory referral to Physical Medicine Rehab       Plan: Findings:  Chronic, worsening and severe bilateral lower back pain radiating to buttocks and posterior thighs down to knees. Patient continues to have severe pain despite good conservative therapies such as home exercise regimen and rest. Patients clinical presentation and exam are consistent with more neurogenic claudication, however no evidence of central stenosis on lumbar MRI imaging from 2022. Next step is to perform diagnostic and hopefully therapeutic bilateral L3 transforaminal epidural steroid injection under fluoroscopic guidance. If good relief of pain we can repeat this procedure, if his pain persists would consider obtaining new lumbar MRI imaging. I did refill short course of Tramadol today for 1-2 tablets twice daily. No signs of abuse noted. If long term prescription for Tramadol required we would consider referral to [facility] for chronic pain management and continued interventional spine procedures. No red flag symptoms noted upon exam today.   Of note, patient did inquire about Dr. Alvester Morin and myself treating his wife Randall [name]. We are happy to see her in the office to further discuss.     Meds & Orders:  Meds ordered this encounter  Medications   traMADol (ULTRAM) 50 MG tablet    Sig: Take 1-2 tablets (50 mg)  twice daily for moderate/severe pain.    Dispense:  40 tablet    Refill:  0    Orders Placed This Encounter  Procedures   Ambulatory referral to Physical Medicine Rehab    Follow-up: Return for Bilateral L3 transforaminal epidural steroid injection.   Procedures: No procedures  performed      Clinical History: MRI LUMBAR SPINE WITHOUT CONTRAST     TECHNIQUE:  Multiplanar, multisequence MR imaging of the lumbar spine was  performed. No intravenous contrast was administered.     COMPARISON:  MRI of the lumbar spine 02/19/2018     FINDINGS:  Segmentation: 5 non rib-bearing lumbar type vertebral bodies are  present. The lowest fully formed vertebral body is L5.     Alignment: No significant listhesis is present. Mild straightening  of the normal lumbar lordosis is stable.     Vertebrae: Mild endplate marrow changes are present posteriorly at  L4-5. Marrow signal and vertebral body heights are otherwise normal.     Conus medullaris and cauda equina: Conus extends to the T12-L1  level. Conus and cauda equina appear normal.     Paraspinal and other soft tissues: Bilateral renal cystic change  again noted, left greater than right. No new lesions are present. No  significant adenopathy is present.     Disc levels:     T12-L1: Negative.     L1-2: Mild disc bulging and facet hypertrophy is present without  significant stenosis or change.     L2-3: Leftward disc bulging is present. Mild facet hypertrophy  bilaterally is stable.     L3-4: A broad-based disc protrusion is again noted. Progressive  subarticular narrowing is present, left greater than right. Moderate  bilateral foraminal stenosis is similar the prior exam, left greater  than right.     L4-5: Wide laminectomy decompresses the central canal. Moderate  foraminal narrowing bilaterally is similar the prior study.     L5-S1: Negative.     IMPRESSION:  1. Progressive subarticular narrowing at L3-4 is worse on the left  despite laminectomy.  2. Moderate bilateral foraminal stenosis at L3-4 is similar the  prior exam, left greater than right.  3. Wide laminectomy at L4-5 with stable moderate foraminal narrowing  bilaterally.  4. Leftward disc bulging and facet hypertrophy at L1-2 and L2-3   without significant stenosis or change.        Electronically Signed    By: Marin Roberts M.D.    On: 03/18/2021 14:44   He reports that he has quit smoking. His smoking use included cigarettes. He has a 40 pack-year smoking history. He has never used smokeless tobacco. No results for input(s): "HGBA1C", "LABURIC" in the last 8760 hours.  Objective:  VS:  HT:5\' 7"  (170.2 cm)   WT:180 lb (81.6 kg)  BMI:28.19    BP:137/70  HR:61bpm  TEMP: ( )  RESP:  Physical Exam Vitals and nursing note reviewed.  HENT:     Head: Normocephalic and atraumatic.     Right Ear: External ear normal.     Left Ear: External ear normal.     Nose: Nose normal.     Mouth/Throat:     Mouth: Mucous membranes are moist.  Eyes:     Extraocular Movements: Extraocular movements intact.  Cardiovascular:     Rate and Rhythm: Normal rate.     Pulses: Normal pulses.  Pulmonary:     Effort: Pulmonary effort is normal.  Abdominal:     General: Abdomen is  flat. There is no distension.  Musculoskeletal:        General: Tenderness present.     Cervical back: Normal range of motion.     Comments: Patient is slow to rise from seated position to standing. Pain noted to with facet loading and lumbar extension. 5/5 strength noted with bilateral hip flexion, knee flexion/extension, ankle dorsiflexion/plantarflexion and EHL. No clonus noted bilaterally. No pain upon palpation of greater trochanters. No pain with internal/external rotation of bilateral hips. Sensation intact bilaterally. Negative slump test bilaterally. Ambulates without aid, gait steady.     Skin:    General: Skin is warm and dry.     Capillary Refill: Capillary refill takes less than 2 seconds.  Neurological:     General: No focal deficit present.     Mental Status: He is alert and oriented to person, place, and time.  Psychiatric:        Mood and Affect: Mood normal.        Behavior: Behavior normal.     Ortho Exam  Imaging: No results  found.  Past Medical/Family/Surgical/Social History: Medications & Allergies reviewed per EMR, new medications updated. 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 1 by Cysto   RLS (restless legs syndrome)    Family History  Problem Relation Age of Onset   Anesthesia problems Neg Hx    Past Surgical History:  Procedure Laterality Date   Cervial  May 2012   4-5, 6-7   LUMBAR LAMINECTOMY N/A 05/15/2014   Procedure: MICRODISCECTOMY LUMBAR LAMINECTOMY;  Surgeon: Eldred Manges, MD;  Location: [facility] OR;  Service: Orthopedics;  Laterality: N/A;  L3-4, L4-5 Decompression, Left L3-4, L4-5 Microdiscectomy   POSTERIOR CERVICAL FUSION/FORAMINOTOMY  12/19/2011   Procedure: POSTERIOR CERVICAL FUSION/FORAMINOTOMY LEVEL 2;  Surgeon: Eldred Manges, MD;  Location: [facility] OR;  Service: Orthopedics;  Laterality: N/A;  Posterior Cervical Fusion, C4-5, C6-7 Wiring, Vitoss, Iliac aspirate   TONSILLECTOMY     Social History   Occupational History   Not on file  Tobacco Use   Smoking status: Former    Current packs/day: 1.00    Average packs/day: 1 pack/day for 40.0 years (40.0 ttl pk-yrs)    Types: Cigarettes   Smokeless tobacco: Never   Tobacco comments:    Encouraged to remain smoke free.  Substance and Sexual Activity   Alcohol use: Yes     Comment: occ   Drug use: No   Sexual activity: Not on file

## 2023-05-11 NOTE — Progress Notes (Unsigned)
Patient presents with low back pain x one month. He denies injury. He has pain across low back which radiates down into buttocks and into inner thighs to the knees. He has previous problems with his back before, but that affected the outsides of his legs. This is aggravating him when he sleeps. He is having difficulty sitting, standing, and laying down. He has taken tramadol with some relief. In the last few weeks, he has had to increase the amount of tramadol he is taking due to increasing pain.  Functional Pain Scale - descriptive words and definitions  Unmanageable (7)  Pain interferes with normal ADL's/nothing seems to help/sleep is very difficult/active distractions are very difficult to concentrate on. Severe range order  Average Pain 7

## 2023-05-14 DIAGNOSIS — R3915 Urgency of urination: Secondary | ICD-10-CM | POA: Diagnosis not present

## 2023-05-14 DIAGNOSIS — R350 Frequency of micturition: Secondary | ICD-10-CM | POA: Diagnosis not present

## 2023-05-21 DIAGNOSIS — R350 Frequency of micturition: Secondary | ICD-10-CM | POA: Diagnosis not present

## 2023-05-22 ENCOUNTER — Other Ambulatory Visit: Payer: Self-pay | Admitting: Physical Medicine and Rehabilitation

## 2023-05-22 DIAGNOSIS — M25551 Pain in right hip: Secondary | ICD-10-CM

## 2023-05-25 ENCOUNTER — Other Ambulatory Visit: Payer: Self-pay | Admitting: Physical Medicine and Rehabilitation

## 2023-05-25 MED ORDER — TRAMADOL HCL 50 MG PO TABS: 50.0000 mg | ORAL_TABLET | Freq: Three times a day (TID) | ORAL | 0 refills | Status: DC | PRN

## 2023-05-28 ENCOUNTER — Other Ambulatory Visit: Payer: Self-pay

## 2023-05-28 ENCOUNTER — Ambulatory Visit: Payer: Medicare Other | Admitting: Physical Medicine and Rehabilitation

## 2023-05-28 VITALS — BP 149/77 | HR 77

## 2023-05-28 DIAGNOSIS — M5416 Radiculopathy, lumbar region: Secondary | ICD-10-CM | POA: Diagnosis not present

## 2023-05-28 MED ORDER — METHYLPREDNISOLONE ACETATE 80 MG/ML IJ SUSP
80.0000 mg | Freq: Once | INTRAMUSCULAR | Status: AC
  Administered 2023-05-28: 80 mg

## 2023-05-28 NOTE — Procedures (Signed)
Lumbosacral Transforaminal Epidural Steroid Injection - Sub-Pedicular Approach with Fluoroscopic Guidance  Patient: Randall [name]      Date of Birth: 1945-12-04 MRN: [id number] PCP: Catha Gosselin, MD      Visit Date: 05/28/2023   Universal Protocol:    Date/Time: 05/28/2023  Consent Given By: the patient  Position: PRONE  Additional Comments: Vital signs were monitored before and after the procedure. Patient was prepped and draped in the usual sterile fashion. The correct patient, procedure, and site was verified.   Injection Procedure Details:   Procedure diagnoses: Lumbar radiculopathy [M54.16]    Meds Administered:  Meds ordered this encounter  Medications   methylPREDNISolone acetate (DEPO-MEDROL) injection 80 mg    Laterality: Bilateral  Location/Site: L3  Needle:5.0 in., 22 ga.  Short bevel or Quincke spinal needle  Needle Placement: Transforaminal  Findings:    -Comments: Excellent flow of contrast along the nerve, nerve root and into the epidural space.  Procedure Details: After squaring off the end-plates to get a true AP view, the C-arm was positioned so that an oblique view of the foramen as noted above was visualized. The target area is just inferior to the "nose of the scotty dog" or sub pedicular. The soft tissues overlying this structure were infiltrated with 2-3 ml. of 1% Lidocaine without Epinephrine.  The spinal needle was inserted toward the target using a "trajectory" view along the fluoroscope beam.  Under AP and lateral visualization, the needle was advanced so it did not puncture dura and was located close the 6 O'Clock position of the pedical in AP tracterory. Biplanar projections were used to confirm position. Aspiration was confirmed to be negative for CSF and/or blood. A 1-2 ml. volume of Isovue-250 was injected and flow of contrast was noted at each level. Radiographs were obtained for documentation purposes.   After attaining the desired  flow of contrast documented above, a 0.5 to 1.0 ml test dose of 0.25% Marcaine was injected into each respective transforaminal space.  The patient was observed for 90 seconds post injection.  After no sensory deficits were reported, and normal lower extremity motor function was noted,   the above injectate was administered so that equal amounts of the injectate were placed at each foramen (level) into the transforaminal epidural space.   Additional Comments:  No complications occurred Dressing: 2 x 2 sterile gauze and Band-Aid    Post-procedure details: Patient was observed during the procedure. Post-procedure instructions were reviewed.  Patient left the clinic in stable condition.

## 2023-05-28 NOTE — Progress Notes (Signed)
Functional Pain Scale - descriptive words and definitions  Unmanageable (7)  Pain interferes with normal ADL's/nothing seems to help/sleep is very difficult/active distractions are very difficult to concentrate on. Severe range order  Average Pain 9   +Driver, -BT, -Dye Allergies.  Lower back pain on both sides that radiates to the hips and legs

## 2023-05-28 NOTE — Patient Instructions (Signed)

## 2023-06-03 NOTE — Progress Notes (Signed)
Randall [name] - 77 y.o. male MRN [id number]  Date of birth: August 04, 1946  Office Visit Note: Visit Date: 05/28/2023 PCP: Catha Gosselin, MD Referred by: Catha Gosselin, MD  Subjective: Chief Complaint  Patient presents with   Lower Back - Pain   HPI:  Randall [name] is a 77 y.o. male who comes in today at the request of Ellin Goodie, FNP for planned Bilateral L3-4 Lumbar Transforaminal epidural steroid injection with fluoroscopic guidance.  The patient has failed conservative care including home exercise, medications, time and activity modification.  This injection will be diagnostic and hopefully therapeutic.  Please see requesting physician notes for further details and justification.   ROS Otherwise per HPI.  Assessment & Plan: Visit Diagnoses:    ICD-10-CM   1. Lumbar radiculopathy  M54.16 XR C-ARM NO REPORT    Epidural Steroid injection    methylPREDNISolone acetate (DEPO-MEDROL) injection 80 mg      Plan: No additional findings.   Meds & Orders:  Meds ordered this encounter  Medications   methylPREDNISolone acetate (DEPO-MEDROL) injection 80 mg    Orders Placed This Encounter  Procedures   XR C-ARM NO REPORT   Epidural Steroid injection    Follow-up: Return for visit to requesting provider as needed.   Procedures: No procedures performed  Lumbosacral Transforaminal Epidural Steroid Injection - Sub-Pedicular Approach with Fluoroscopic Guidance  Patient: Randall [name]      Date of Birth: 1945/10/30 MRN: [id number] PCP: Catha Gosselin, MD      Visit Date: 05/28/2023   Universal Protocol:    Date/Time: 05/28/2023  Consent Given By: the patient  Position: PRONE  Additional Comments: Vital signs were monitored before and after the procedure. Patient was prepped and draped in the usual sterile fashion. The correct patient, procedure, and site was verified.   Injection Procedure Details:   Procedure diagnoses: Lumbar radiculopathy [M54.16]    Meds  Administered:  Meds ordered this encounter  Medications   methylPREDNISolone acetate (DEPO-MEDROL) injection 80 mg    Laterality: Bilateral  Location/Site: L3  Needle:5.0 in., 22 ga.  Short bevel or Quincke spinal needle  Needle Placement: Transforaminal  Findings:    -Comments: Excellent flow of contrast along the nerve, nerve root and into the epidural space.  Procedure Details: After squaring off the end-plates to get a true AP view, the C-arm was positioned so that an oblique view of the foramen as noted above was visualized. The target area is just inferior to the "nose of the scotty dog" or sub pedicular. The soft tissues overlying this structure were infiltrated with 2-3 ml. of 1% Lidocaine without Epinephrine.  The spinal needle was inserted toward the target using a "trajectory" view along the fluoroscope beam.  Under AP and lateral visualization, the needle was advanced so it did not puncture dura and was located close the 6 O'Clock position of the pedical in AP tracterory. Biplanar projections were used to confirm position. Aspiration was confirmed to be negative for CSF and/or blood. A 1-2 ml. volume of Isovue-250 was injected and flow of contrast was noted at each level. Radiographs were obtained for documentation purposes.   After attaining the desired flow of contrast documented above, a 0.5 to 1.0 ml test dose of 0.25% Marcaine was injected into each respective transforaminal space.  The patient was observed for 90 seconds post injection.  After no sensory deficits were reported, and normal lower extremity motor function was noted,   the above injectate was administered so  that equal amounts of the injectate were placed at each foramen (level) into the transforaminal epidural space.   Additional Comments:  No complications occurred Dressing: 2 x 2 sterile gauze and Band-Aid    Post-procedure details: Patient was observed during the procedure. Post-procedure  instructions were reviewed.  Patient left the clinic in stable condition.    Clinical History: MRI LUMBAR SPINE WITHOUT CONTRAST     TECHNIQUE:  Multiplanar, multisequence MR imaging of the lumbar spine was  performed. No intravenous contrast was administered.     COMPARISON:  MRI of the lumbar spine 02/19/2018     FINDINGS:  Segmentation: 5 non rib-bearing lumbar type vertebral bodies are  present. The lowest fully formed vertebral body is L5.     Alignment: No significant listhesis is present. Mild straightening  of the normal lumbar lordosis is stable.     Vertebrae: Mild endplate marrow changes are present posteriorly at  L4-5. Marrow signal and vertebral body heights are otherwise normal.     Conus medullaris and cauda equina: Conus extends to the T12-L1  level. Conus and cauda equina appear normal.     Paraspinal and other soft tissues: Bilateral renal cystic change  again noted, left greater than right. No new lesions are present. No  significant adenopathy is present.     Disc levels:     T12-L1: Negative.     L1-2: Mild disc bulging and facet hypertrophy is present without  significant stenosis or change.     L2-3: Leftward disc bulging is present. Mild facet hypertrophy  bilaterally is stable.     L3-4: A broad-based disc protrusion is again noted. Progressive  subarticular narrowing is present, left greater than right. Moderate  bilateral foraminal stenosis is similar the prior exam, left greater  than right.     L4-5: Wide laminectomy decompresses the central canal. Moderate  foraminal narrowing bilaterally is similar the prior study.     L5-S1: Negative.     IMPRESSION:  1. Progressive subarticular narrowing at L3-4 is worse on the left  despite laminectomy.  2. Moderate bilateral foraminal stenosis at L3-4 is similar the  prior exam, left greater than right.  3. Wide laminectomy at L4-5 with stable moderate foraminal narrowing  bilaterally.  4.  Leftward disc bulging and facet hypertrophy at L1-2 and L2-3  without significant stenosis or change.        Electronically Signed    By: Marin Roberts M.D.    On: 03/18/2021 14:44     Objective:  VS:  HT:    WT:   BMI:     BP:(!) 149/77  HR:77bpm  TEMP: ( )  RESP:  Physical Exam Vitals and nursing note reviewed.  Constitutional:      General: He is not in acute distress.    Appearance: Normal appearance. He is not ill-appearing.  HENT:     Head: Normocephalic and atraumatic.     Right Ear: External ear normal.     Left Ear: External ear normal.     Nose: No congestion.  Eyes:     Extraocular Movements: Extraocular movements intact.  Cardiovascular:     Rate and Rhythm: Normal rate.     Pulses: Normal pulses.  Pulmonary:     Effort: Pulmonary effort is normal. No respiratory distress.  Abdominal:     General: There is no distension.     Palpations: Abdomen is soft.  Musculoskeletal:        General: No tenderness or  signs of injury.     Cervical back: Neck supple.     Right lower leg: No edema.     Left lower leg: No edema.     Comments: Patient has good distal strength without clonus.  Skin:    Findings: No erythema or rash.  Neurological:     General: No focal deficit present.     Mental Status: He is alert and oriented to person, place, and time.     Sensory: No sensory deficit.     Motor: No weakness or abnormal muscle tone.     Coordination: Coordination normal.  Psychiatric:        Mood and Affect: Mood normal.        Behavior: Behavior normal.      Imaging: No results found.

## 2023-06-05 DIAGNOSIS — R3915 Urgency of urination: Secondary | ICD-10-CM | POA: Diagnosis not present

## 2023-06-05 DIAGNOSIS — R350 Frequency of micturition: Secondary | ICD-10-CM | POA: Diagnosis not present

## 2023-06-08 ENCOUNTER — Other Ambulatory Visit: Payer: Self-pay | Admitting: Physical Medicine and Rehabilitation

## 2023-06-08 DIAGNOSIS — M5416 Radiculopathy, lumbar region: Secondary | ICD-10-CM

## 2023-06-08 MED ORDER — TRAMADOL HCL 50 MG PO TABS: ORAL_TABLET | ORAL | 0 refills | Status: DC

## 2023-06-11 DIAGNOSIS — R3915 Urgency of urination: Secondary | ICD-10-CM | POA: Diagnosis not present

## 2023-06-18 ENCOUNTER — Other Ambulatory Visit: Payer: Self-pay | Admitting: Physical Medicine and Rehabilitation

## 2023-06-18 DIAGNOSIS — R350 Frequency of micturition: Secondary | ICD-10-CM | POA: Diagnosis not present

## 2023-06-18 DIAGNOSIS — R3915 Urgency of urination: Secondary | ICD-10-CM | POA: Diagnosis not present

## 2023-06-18 MED ORDER — TRAMADOL HCL 50 MG PO TABS: ORAL_TABLET | ORAL | 0 refills | Status: DC

## 2023-06-19 ENCOUNTER — Ambulatory Visit
Admission: RE | Admit: 2023-06-19 | Discharge: 2023-06-19 | Disposition: A | Discharge status: Home / Self Care | Payer: Medicare Other | Source: Ambulatory Visit | Attending: Physical Medicine and Rehabilitation | Admitting: Physical Medicine and Rehabilitation

## 2023-06-19 DIAGNOSIS — M5416 Radiculopathy, lumbar region: Secondary | ICD-10-CM | POA: Diagnosis not present

## 2023-06-19 DIAGNOSIS — M48061 Spinal stenosis, lumbar region without neurogenic claudication: Secondary | ICD-10-CM | POA: Diagnosis not present

## 2023-06-19 DIAGNOSIS — M47816 Spondylosis without myelopathy or radiculopathy, lumbar region: Secondary | ICD-10-CM | POA: Diagnosis not present

## 2023-06-25 DIAGNOSIS — R350 Frequency of micturition: Secondary | ICD-10-CM | POA: Diagnosis not present

## 2023-06-25 DIAGNOSIS — R3915 Urgency of urination: Secondary | ICD-10-CM | POA: Diagnosis not present

## 2023-07-02 ENCOUNTER — Encounter: Payer: Self-pay | Admitting: Physical Medicine and Rehabilitation

## 2023-07-02 ENCOUNTER — Other Ambulatory Visit: Payer: Self-pay | Admitting: Physical Medicine and Rehabilitation

## 2023-07-02 DIAGNOSIS — G894 Chronic pain syndrome: Secondary | ICD-10-CM

## 2023-07-02 DIAGNOSIS — R3915 Urgency of urination: Secondary | ICD-10-CM | POA: Diagnosis not present

## 2023-07-02 DIAGNOSIS — M5416 Radiculopathy, lumbar region: Secondary | ICD-10-CM

## 2023-07-02 DIAGNOSIS — R350 Frequency of micturition: Secondary | ICD-10-CM | POA: Diagnosis not present

## 2023-07-02 MED ORDER — TRAMADOL HCL 50 MG PO TABS: 50.0000 mg | ORAL_TABLET | Freq: Three times a day (TID) | ORAL | 0 refills | Status: DC | PRN

## 2023-07-06 ENCOUNTER — Telehealth: Payer: Self-pay | Admitting: Physical Medicine and Rehabilitation

## 2023-07-06 NOTE — Telephone Encounter (Signed)
Pt called would like CB from [place] states he has a few questions he needs answered would did not want to disclose information when asked

## 2023-07-07 ENCOUNTER — Telehealth: Payer: Self-pay | Admitting: Physical Medicine and Rehabilitation

## 2023-07-07 NOTE — Telephone Encounter (Signed)
Pt request a call back from Dr. To discuss further questions about MRI, etc..

## 2023-07-09 DIAGNOSIS — Z23 Encounter for immunization: Secondary | ICD-10-CM | POA: Diagnosis not present

## 2023-07-13 ENCOUNTER — Other Ambulatory Visit: Payer: Self-pay | Admitting: Physical Medicine and Rehabilitation

## 2023-07-14 ENCOUNTER — Other Ambulatory Visit: Payer: Self-pay | Admitting: Physical Medicine and Rehabilitation

## 2023-07-14 MED ORDER — TRAMADOL HCL 50 MG PO TABS: 50.0000 mg | ORAL_TABLET | Freq: Three times a day (TID) | ORAL | 0 refills | Status: DC | PRN

## 2023-07-27 DIAGNOSIS — M4727 Other spondylosis with radiculopathy, lumbosacral region: Secondary | ICD-10-CM | POA: Diagnosis not present

## 2023-07-27 DIAGNOSIS — Z79899 Other long term (current) drug therapy: Secondary | ICD-10-CM | POA: Diagnosis not present

## 2023-07-29 DIAGNOSIS — Z79899 Other long term (current) drug therapy: Secondary | ICD-10-CM | POA: Diagnosis not present

## 2023-07-30 DIAGNOSIS — R3915 Urgency of urination: Secondary | ICD-10-CM | POA: Diagnosis not present

## 2023-07-30 DIAGNOSIS — R350 Frequency of micturition: Secondary | ICD-10-CM | POA: Diagnosis not present

## 2023-08-03 DIAGNOSIS — Z79899 Other long term (current) drug therapy: Secondary | ICD-10-CM | POA: Diagnosis not present

## 2023-08-03 DIAGNOSIS — Z6827 Body mass index (BMI) 27.0-27.9, adult: Secondary | ICD-10-CM | POA: Diagnosis not present

## 2023-08-03 DIAGNOSIS — M48061 Spinal stenosis, lumbar region without neurogenic claudication: Secondary | ICD-10-CM | POA: Diagnosis not present

## 2023-08-03 DIAGNOSIS — M4727 Other spondylosis with radiculopathy, lumbosacral region: Secondary | ICD-10-CM | POA: Diagnosis not present

## 2023-09-01 DIAGNOSIS — I10 Essential (primary) hypertension: Secondary | ICD-10-CM | POA: Diagnosis not present

## 2023-09-01 DIAGNOSIS — Z6828 Body mass index (BMI) 28.0-28.9, adult: Secondary | ICD-10-CM | POA: Diagnosis not present

## 2023-09-01 DIAGNOSIS — M4727 Other spondylosis with radiculopathy, lumbosacral region: Secondary | ICD-10-CM | POA: Diagnosis not present

## 2023-09-01 DIAGNOSIS — M48061 Spinal stenosis, lumbar region without neurogenic claudication: Secondary | ICD-10-CM | POA: Diagnosis not present

## 2023-09-01 DIAGNOSIS — Z79899 Other long term (current) drug therapy: Secondary | ICD-10-CM | POA: Diagnosis not present

## 2023-09-15 DIAGNOSIS — Z6828 Body mass index (BMI) 28.0-28.9, adult: Secondary | ICD-10-CM | POA: Diagnosis not present

## 2023-09-15 DIAGNOSIS — M4727 Other spondylosis with radiculopathy, lumbosacral region: Secondary | ICD-10-CM | POA: Diagnosis not present

## 2023-09-15 DIAGNOSIS — M48061 Spinal stenosis, lumbar region without neurogenic claudication: Secondary | ICD-10-CM | POA: Diagnosis not present

## 2023-09-29 DIAGNOSIS — Z6828 Body mass index (BMI) 28.0-28.9, adult: Secondary | ICD-10-CM | POA: Diagnosis not present

## 2023-09-29 DIAGNOSIS — Z79899 Other long term (current) drug therapy: Secondary | ICD-10-CM | POA: Diagnosis not present

## 2023-09-29 DIAGNOSIS — M48061 Spinal stenosis, lumbar region without neurogenic claudication: Secondary | ICD-10-CM | POA: Diagnosis not present

## 2023-09-29 DIAGNOSIS — E538 Deficiency of other specified B group vitamins: Secondary | ICD-10-CM | POA: Diagnosis not present

## 2023-09-29 DIAGNOSIS — M4727 Other spondylosis with radiculopathy, lumbosacral region: Secondary | ICD-10-CM | POA: Diagnosis not present

## 2023-10-30 DIAGNOSIS — Z6828 Body mass index (BMI) 28.0-28.9, adult: Secondary | ICD-10-CM | POA: Diagnosis not present

## 2023-10-30 DIAGNOSIS — M4727 Other spondylosis with radiculopathy, lumbosacral region: Secondary | ICD-10-CM | POA: Diagnosis not present

## 2023-10-30 DIAGNOSIS — M48061 Spinal stenosis, lumbar region without neurogenic claudication: Secondary | ICD-10-CM | POA: Diagnosis not present

## 2023-10-30 DIAGNOSIS — R030 Elevated blood-pressure reading, without diagnosis of hypertension: Secondary | ICD-10-CM | POA: Diagnosis not present

## 2023-10-30 DIAGNOSIS — Z79899 Other long term (current) drug therapy: Secondary | ICD-10-CM | POA: Diagnosis not present

## 2023-11-09 ENCOUNTER — Ambulatory Visit (HOSPITAL_COMMUNITY)
Admission: RE | Admit: 2023-11-09 | Discharge: 2023-11-09 | Disposition: A | Discharge status: Home / Self Care | Payer: Medicare Other | Source: Ambulatory Visit | Attending: Cardiology | Admitting: Cardiology

## 2023-11-09 DIAGNOSIS — J440 Chronic obstructive pulmonary disease with acute lower respiratory infection: Secondary | ICD-10-CM | POA: Diagnosis not present

## 2023-11-09 DIAGNOSIS — I7143 Infrarenal abdominal aortic aneurysm, without rupture: Secondary | ICD-10-CM | POA: Diagnosis not present

## 2023-11-10 ENCOUNTER — Encounter: Payer: Self-pay | Admitting: Acute Care

## 2023-11-11 ENCOUNTER — Other Ambulatory Visit (HOSPITAL_COMMUNITY): Payer: Self-pay | Admitting: Emergency Medicine

## 2023-11-11 DIAGNOSIS — I7143 Infrarenal abdominal aortic aneurysm, without rupture: Secondary | ICD-10-CM

## 2023-11-16 DIAGNOSIS — Z125 Encounter for screening for malignant neoplasm of prostate: Secondary | ICD-10-CM | POA: Diagnosis not present

## 2023-11-16 DIAGNOSIS — Z79899 Other long term (current) drug therapy: Secondary | ICD-10-CM | POA: Diagnosis not present

## 2023-11-16 DIAGNOSIS — E559 Vitamin D deficiency, unspecified: Secondary | ICD-10-CM | POA: Diagnosis not present

## 2023-11-16 DIAGNOSIS — E7800 Pure hypercholesterolemia, unspecified: Secondary | ICD-10-CM | POA: Diagnosis not present

## 2023-11-16 DIAGNOSIS — Z0000 Encounter for general adult medical examination without abnormal findings: Secondary | ICD-10-CM | POA: Diagnosis not present

## 2023-11-16 DIAGNOSIS — R030 Elevated blood-pressure reading, without diagnosis of hypertension: Secondary | ICD-10-CM | POA: Diagnosis not present

## 2023-11-16 DIAGNOSIS — Z6827 Body mass index (BMI) 27.0-27.9, adult: Secondary | ICD-10-CM | POA: Diagnosis not present

## 2023-11-16 DIAGNOSIS — Z131 Encounter for screening for diabetes mellitus: Secondary | ICD-10-CM | POA: Diagnosis not present

## 2023-11-27 DIAGNOSIS — M4727 Other spondylosis with radiculopathy, lumbosacral region: Secondary | ICD-10-CM | POA: Diagnosis not present

## 2023-11-27 DIAGNOSIS — Z6827 Body mass index (BMI) 27.0-27.9, adult: Secondary | ICD-10-CM | POA: Diagnosis not present

## 2023-11-27 DIAGNOSIS — M48061 Spinal stenosis, lumbar region without neurogenic claudication: Secondary | ICD-10-CM | POA: Diagnosis not present

## 2023-11-27 DIAGNOSIS — Z79899 Other long term (current) drug therapy: Secondary | ICD-10-CM | POA: Diagnosis not present

## 2023-12-02 DIAGNOSIS — Z79899 Other long term (current) drug therapy: Secondary | ICD-10-CM | POA: Diagnosis not present

## 2023-12-18 DIAGNOSIS — K635 Polyp of colon: Secondary | ICD-10-CM | POA: Diagnosis not present

## 2023-12-18 DIAGNOSIS — K5900 Constipation, unspecified: Secondary | ICD-10-CM | POA: Diagnosis not present

## 2023-12-18 DIAGNOSIS — I7143 Infrarenal abdominal aortic aneurysm, without rupture: Secondary | ICD-10-CM | POA: Diagnosis not present

## 2023-12-25 DIAGNOSIS — Z6827 Body mass index (BMI) 27.0-27.9, adult: Secondary | ICD-10-CM | POA: Diagnosis not present

## 2023-12-25 DIAGNOSIS — J441 Chronic obstructive pulmonary disease with (acute) exacerbation: Secondary | ICD-10-CM | POA: Diagnosis not present

## 2023-12-25 DIAGNOSIS — Z79899 Other long term (current) drug therapy: Secondary | ICD-10-CM | POA: Diagnosis not present

## 2023-12-25 DIAGNOSIS — R030 Elevated blood-pressure reading, without diagnosis of hypertension: Secondary | ICD-10-CM | POA: Diagnosis not present

## 2023-12-25 DIAGNOSIS — J069 Acute upper respiratory infection, unspecified: Secondary | ICD-10-CM | POA: Diagnosis not present

## 2023-12-25 DIAGNOSIS — J209 Acute bronchitis, unspecified: Secondary | ICD-10-CM | POA: Diagnosis not present

## 2023-12-25 DIAGNOSIS — M4727 Other spondylosis with radiculopathy, lumbosacral region: Secondary | ICD-10-CM | POA: Diagnosis not present

## 2023-12-25 DIAGNOSIS — M48061 Spinal stenosis, lumbar region without neurogenic claudication: Secondary | ICD-10-CM | POA: Diagnosis not present

## 2024-01-07 DIAGNOSIS — R051 Acute cough: Secondary | ICD-10-CM | POA: Diagnosis not present

## 2024-01-14 ENCOUNTER — Ambulatory Visit (INDEPENDENT_AMBULATORY_CARE_PROVIDER_SITE_OTHER): Admitting: Internal Medicine

## 2024-01-14 VITALS — BP 136/79 | HR 66 | Temp 98.1°F | Ht 66.0 in | Wt 170.0 lb

## 2024-01-14 DIAGNOSIS — R0609 Other forms of dyspnea: Secondary | ICD-10-CM | POA: Diagnosis not present

## 2024-01-14 DIAGNOSIS — Z01811 Encounter for preprocedural respiratory examination: Secondary | ICD-10-CM

## 2024-01-14 DIAGNOSIS — R053 Chronic cough: Secondary | ICD-10-CM

## 2024-01-14 DIAGNOSIS — J439 Emphysema, unspecified: Secondary | ICD-10-CM

## 2024-01-14 DIAGNOSIS — R0989 Other specified symptoms and signs involving the circulatory and respiratory systems: Secondary | ICD-10-CM | POA: Diagnosis not present

## 2024-01-14 DIAGNOSIS — Z87891 Personal history of nicotine dependence: Secondary | ICD-10-CM | POA: Diagnosis not present

## 2024-01-14 MED ORDER — BREZTRI AEROSPHERE 160-9-4.8 MCG/ACT IN AERO: 2.0000 | INHALATION_SPRAY | Freq: Two times a day (BID) | RESPIRATORY_TRACT | Status: DC

## 2024-01-14 NOTE — Progress Notes (Signed)
 OV 01/14/2024  Subjective:  Patient ID: Randall [name], adult , DOB: 08/14/1946 , age 78 y.o. , MRN: [id number] , ADDRESS: [address] [place] [address] PCP Catha Gosselin, MD Patient Care Team: Catha Gosselin, MD as PCP - General (Family Medicine)  This Provider for this visit: Treatment Team:  Attending Provider: Kalman Shan, MD    01/14/2024 -   Chief Complaint  Patient presents with   Follow-up    COPD consult      HPI Randall [name] 78 y.o. -new consult referred for possible COPD evaluation.  In 2021 up until then he was undergoing lung cancer screening and then he stopped.  Back then his CT reported some mild emphysema.  He states he been aware of emphysema diagnosis for 6 or 7 years and is on Symbicort with which she is compliant.  He says at baseline he can do yard work and get winded but otherwise very minimal shortness of breath and almost no cough.  Then approximately 3 weeks ago he had episode of "COPD flareup".  He said it was really bad his pulse ox was 87%.  He just had 1 day of shortness of breath cough and chest tightness and wheezing.  He went to the urgent care got nebulizers and 6-day of steroids.  He was discharged from the urgent care when his pulse ox improved to 92%.  He is significantly improved but he has a residual mild cough with some white sputum but overall he is doing well.  He is a heavy smoker started smoking at age 44 quit in 2017 around age 64.  Smoked 1 pack/day.  There is no family history of COPD or lung disease.  He is a retired Programmer, [employer].  He has past medical history of high blood pressure and hyperlipidemia and COPD from 6 or 7 years ago.  He is had back surgery.  No MI no stroke no cancer\   He has upcoming colonoscopy in 1 month and he wants preoperative clearance for that.    SYMPTOM SCALE - ILD 01/14/2024  Current weight   O2 use ra  Shortness of Breath 0 -> 5 scale with 5 being worst (score  6 If unable to do)  At rest 0  Simple tasks - showers, clothes change, eating, shaving 0  Household (dishes, doing bed, laundry) 2  Shopping 0  Walking level at own pace 0  Walking up Stairs 2  Total (30-36) Dyspnea Score 4  How bad is your cough? 2  How bad is your fatigue 0  How bad is nausea 0  How bad is vomiting?  0  How bad is diarrhea? 0  How bad is anxiety? 00  How bad is depression 0  Any chronic pain - if so where and how bad 0      CT Chest data from date:*  - personally visualized and independently interpreted : yes - my findings are: agree  Narrative & Impression  CLINICAL DATA:  78 year old male former smoker, quit 3 years ago, with 41 pack-year history of smoking, for follow-up lung cancer screening   EXAM: CT CHEST WITHOUT CONTRAST LOW-DOSE FOR LUNG CANCER SCREENING   TECHNIQUE: Multidetector CT imaging of the chest was performed following the standard protocol without IV contrast.   COMPARISON:  Low-dose lung cancer screening CT chest dated 11/25/2018   FINDINGS: Cardiovascular: Heart is normal in size.  No pericardial effusion.   No evidence of thoracic  aortic aneurysm. Atherosclerotic calcifications of the aortic arch.   Three vessel coronary atherosclerosis.   Mediastinum/Nodes: No suspicious mediastinal lymphadenopathy.   Visualized thyroid is unremarkable.   Lungs/Pleura: Mild centrilobular and paraseptal emphysematous changes, upper lung predominant.   No focal consolidation.   Platelike scarring/atelectasis in the right lower lobe. This obscures the prior right lower lobe nodule.   2.5 mm calcified granuloma in the right upper lobe, benign.   Chronic left pleural fluid/thickening.   No pneumothorax.   Upper Abdomen: Visualized upper abdomen is notable for a 4.6 cm posterior left upper pole renal cyst and vascular calcifications.   Musculoskeletal: Mild degenerative changes of the visualized thoracolumbar spine. Cervical  spine fixation hardware, incompletely visualized.   IMPRESSION: Lung-RADS 1, negative. Continue annual screening with low-dose chest CT without contrast in 12 months.   Aortic Atherosclerosis (ICD10-I70.0) and Emphysema (ICD10-J43.9).     Electronically Signed   By: Charline Bills M.D.   On: 11/28/2019 13:21    PFT      No data to display             LAB RESULTS last 96 hours No results found.       has a past medical history of Kidney stones and RLS (restless legs syndrome).   reports that he has quit smoking. His smoking use included cigarettes. He has a 40 pack-year smoking history. He has never used smokeless tobacco.  Past Surgical History:  Procedure Laterality Date   Cervial  May 2012   4-5, 6-7   LUMBAR LAMINECTOMY N/A 05/15/2014   Procedure: MICRODISCECTOMY LUMBAR LAMINECTOMY;  Surgeon: Eldred Manges, MD;  Location: [facility] OR;  Service: Orthopedics;  Laterality: N/A;  L3-4, L4-5 Decompression, Left L3-4, L4-5 Microdiscectomy   POSTERIOR CERVICAL FUSION/FORAMINOTOMY  12/19/2011   Procedure: POSTERIOR CERVICAL FUSION/FORAMINOTOMY LEVEL 2;  Surgeon: Eldred Manges, MD;  Location: [facility] OR;  Service: Orthopedics;  Laterality: N/A;  Posterior Cervical Fusion, C4-5, C6-7 Wiring, Vitoss, Iliac aspirate   TONSILLECTOMY      No Known Allergies  Immunization History  Administered Date(s) Administered   Fluad Quad(high Dose 65+) 07/04/2019   Influenza, High Dose Seasonal PF 07/10/2017, 07/07/2018   PFIZER(Purple Top)SARS-COV-2 Vaccination 12/25/2019, 01/24/2020    Family History  Problem Relation Age of Onset   Anesthesia problems Neg Hx      Current Outpatient Medications:    amLODipine (NORVASC) 5 MG tablet, Take 5 mg by mouth daily., Disp: , Rfl:    aspirin 81 MG chewable tablet, 1 tablet, Disp: , Rfl:    augmented betamethasone dipropionate (DIPROLENE-AF) 0.05 % cream, Apply 1 application topically daily., Disp: , Rfl:    losartan (COZAAR) 100 MG tablet, 1  tablet, Disp: , Rfl:    oxycodone (OXY-IR) 5 MG capsule, Take 5 mg by mouth every 6 (six) hours as needed for pain., Disp: , Rfl:    pravastatin (PRAVACHOL) 20 MG tablet, Take 20 mg by mouth daily., Disp: , Rfl:    rOPINIRole (REQUIP) 1 MG tablet, TAKE 1 TABLET BY MOUTH 1 TO 3 HOURS BEFORE BEDTIME ORALLY AT BEDTIME 90 DAYS, Disp: , Rfl:    SYMBICORT 160-4.5 MCG/ACT inhaler, Inhale into the lungs., Disp: , Rfl:    tadalafil (CIALIS) 5 MG tablet, Take 5 mg by mouth daily., Disp: , Rfl:       Objective:   Vitals:   01/14/24 1344  BP: 136/79  Pulse: 66  Temp: 98.1 F (36.7 C)  TempSrc: Oral  SpO2: 94%  Weight: 170 lb (77.1 kg)  Height: 5\' 6"  (1.676 m)    Estimated body mass index is 27.44 kg/m as calculated from the following:   Height as of this encounter: 5\' 6"  (1.676 m).   Weight as of this encounter: 170 lb (77.1 kg).  @WEIGHTCHANGE @  [employer]   01/14/24 1344  Weight: 170 lb (77.1 kg)     Physical Exam   General: No distress. Looks well O2 at rest: no Cane present: no Sitting in wheel chair: no Frail: no Obese: no Neuro: Alert and Oriented x 3. GCS 15. Speech normal Psych: Pleasant Resp:  Barrel Chest - no.  Wheeze - no, Crackles - BILATERAL LL CRACKLES, No overt respiratory distress CVS: Normal heart sounds. Murmurs - no Ext: Stigmata of Connective Tissue Disease - no HEENT: Normal upper airway. PEERL +. No post nasal drip        Assessment:       ICD-10-CM   1. Stopped smoking with greater than 40 pack year history  Z87.891 Alpha-1 antitrypsin phenotype    CBC w/Diff    Comp Met (CMET)    Pulmonary function test    CT Chest High Resolution    2. History of emphysema (HCC)  J43.9 Alpha-1 antitrypsin phenotype    CBC w/Diff    Comp Met (CMET)    Pulmonary function test    CT Chest High Resolution    3. Bibasilar crackles  R09.89 Alpha-1 antitrypsin phenotype    CBC w/Diff    Comp Met (CMET)    Pulmonary function test    CT Chest High  Resolution    4. Chronic cough  R05.3 Alpha-1 antitrypsin phenotype    CBC w/Diff    Comp Met (CMET)    Pulmonary function test    CT Chest High Resolution    5. DOE (dyspnea on exertion)  R06.09 Alpha-1 antitrypsin phenotype    CBC w/Diff    Comp Met (CMET)    Pulmonary function test    CT Chest High Resolution    6. Pre-operative respiratory examination  Z01.811 Alpha-1 antitrypsin phenotype    CBC w/Diff    Comp Met (CMET)    Pulmonary function test    CT Chest High Resolution         Plan:     Patient Instructions  Stopped smoking with greater than 40 pack year history History of emphysema (HCC) Bibasilar crackles Chronic cough DOE (dyspnea on exertion)  -Need to stage your emphysema which was diagnosed in CT scan 2021 -Need to rule out both pulmonary fibrosis   Plan - Stop Symbicort for now - Take 8-week sample of BREZTRI (this is like Symbicort but has not extra medication and it]  -Once the cough improves you can go back to Symbicort. = Check blood work for alpha-1 antitrypsin, CBC with differential, CMP = Check pulmonary function test -Check high-resolution CT scan of the chest   Pre-operative respiratory examination  -I think with your oxygen status being acceptable with exertion I think you are very low risk for pulmonary complications following colonoscopy  Plan - More refined risk assessment at least after the CT scan and blood work  Follow-up - 3-week video visit with Dr. Marchelle Gearing or nurse practitioner to discuss results (okay for PA's are not scheduled by then]   FOLLOWUP Return in about 3 weeks (around 02/04/2024) for Video visit with nurse practitioner Dr. Marchelle Gearing to discuss results.    SIGNATURE    Dr. Kalman Shan, M.D.,  F.C.C.P,  Pulmonary and Critical Care Medicine Staff Physician, [facility] Center Director - Interstitial Lung Disease  Program  Pulmonary Fibrosis [facility] [place], [place], [address]  Pager: [phone], If no answer or between  15:00h - 7:00h: call [phone] Telephone: [phone]  2:15 PM 01/14/2024

## 2024-01-14 NOTE — Addendum Note (Signed)
 Addended by: Gay Filler T on: 01/14/2024 03:49 PM   Modules accepted: Orders

## 2024-01-14 NOTE — Patient Instructions (Addendum)
 Stopped smoking with greater than 40 pack year history History of emphysema (HCC) Bibasilar crackles Chronic cough DOE (dyspnea on exertion)  -Need to stage your emphysema which was diagnosed in CT scan 2021 -Need to rule out both pulmonary fibrosis   Plan - Stop Symbicort for now - Take 8-week sample of BREZTRI (this is like Symbicort but has not extra medication and it]  -Once the cough improves you can go back to Symbicort. = Check blood work for alpha-1 antitrypsin, CBC with differential, CMP = Check pulmonary function test -Check high-resolution CT scan of the chest   Pre-operative respiratory examination  -I think with your oxygen status being acceptable with exertion I think you are very low risk for pulmonary complications following colonoscopy  Plan - More refined risk assessment at least after the CT scan and blood work  Follow-up - 3-week video visit with Dr. Marchelle [name] or nurse practitioner to discuss results (okay for PA's are not scheduled by then]

## 2024-01-15 ENCOUNTER — Ambulatory Visit (INDEPENDENT_AMBULATORY_CARE_PROVIDER_SITE_OTHER): Admitting: Internal Medicine

## 2024-01-15 DIAGNOSIS — H35372 Puckering of macula, left eye: Secondary | ICD-10-CM | POA: Diagnosis not present

## 2024-01-15 DIAGNOSIS — Z87891 Personal history of nicotine dependence: Secondary | ICD-10-CM | POA: Diagnosis not present

## 2024-01-15 DIAGNOSIS — J439 Emphysema, unspecified: Secondary | ICD-10-CM

## 2024-01-15 DIAGNOSIS — R053 Chronic cough: Secondary | ICD-10-CM

## 2024-01-15 DIAGNOSIS — R0609 Other forms of dyspnea: Secondary | ICD-10-CM

## 2024-01-15 DIAGNOSIS — H353131 Nonexudative age-related macular degeneration, bilateral, early dry stage: Secondary | ICD-10-CM | POA: Diagnosis not present

## 2024-01-15 DIAGNOSIS — Z01811 Encounter for preprocedural respiratory examination: Secondary | ICD-10-CM

## 2024-01-15 DIAGNOSIS — R0989 Other specified symptoms and signs involving the circulatory and respiratory systems: Secondary | ICD-10-CM

## 2024-01-15 LAB — PULMONARY FUNCTION TEST
DL/VA % pred: 74 %
DL/VA: 3 ml/min/mmHg/L
DLCO cor % pred: 64 %
DLCO cor: 14.19 ml/min/mmHg
DLCO unc % pred: 64 %
DLCO unc: 14.19 ml/min/mmHg
FEF 25-75 Post: 0.71 L/s
FEF 25-75 Pre: 0.53 L/s
FEF2575-%Change-Post: 33 %
FEF2575-%Pred-Post: 38 %
FEF2575-%Pred-Pre: 29 %
FEV1-%Change-Post: 9 %
FEV1-%Pred-Post: 50 %
FEV1-%Pred-Pre: 46 %
FEV1-Post: 1.28 L
FEV1-Pre: 1.17 L
FEV1FVC-%Change-Post: 3 %
FEV1FVC-%Pred-Pre: 68 %
FEV6-%Change-Post: 7 %
FEV6-%Pred-Post: 74 %
FEV6-%Pred-Pre: 69 %
FEV6-Post: 2.46 L
FEV6-Pre: 2.28 L
FEV6FVC-%Change-Post: 2 %
FEV6FVC-%Pred-Post: 106 %
FEV6FVC-%Pred-Pre: 103 %
FVC-%Change-Post: 5 %
FVC-%Pred-Post: 70 %
FVC-%Pred-Pre: 66 %
FVC-Post: 2.5 L
FVC-Pre: 2.37 L
Post FEV1/FVC ratio: 51 %
Post FEV6/FVC ratio: 99 %
Pre FEV1/FVC ratio: 49 %
Pre FEV6/FVC Ratio: 96 %
RV % pred: 191 %
RV: 4.5 L
TLC % pred: 114 %
TLC: 7.16 L

## 2024-01-15 NOTE — Patient Instructions (Signed)
 Full PFT Performed Today

## 2024-01-15 NOTE — Progress Notes (Signed)
 Full PFT Performed Today

## 2024-01-21 DIAGNOSIS — M48061 Spinal stenosis, lumbar region without neurogenic claudication: Secondary | ICD-10-CM | POA: Diagnosis not present

## 2024-02-01 ENCOUNTER — Telehealth: Payer: Self-pay

## 2024-02-01 NOTE — Telephone Encounter (Signed)
 Copied from CRM [id number]. Topic: Appointments - Scheduling Inquiry for Clinic >> Jan 29, 2024 12:57 PM Randall [name] wrote: Reason for CRM: Patient is requesting confirmation from Ames Dura or nurse on if he should keep his appointment on 4/11 as his CT is not until 4/14 and does want to discuss the results during his appointment. Patient also states if the provider would like him to reschedule he can't do 4/16 -4/22 as he will be out of of commission due to his colonoscopy. Please call patient back on his mobile number to advise.   ATX X1. LMTCB  A CT scan can take up to 2-3 weeks for the results to come back. The 4/11 needs to be cancelled. He can be seen possibly the week of the 5th in May. Please call pt and have him rescheduled to May.

## 2024-02-02 NOTE — Telephone Encounter (Signed)
 Appt made. NFN

## 2024-02-05 ENCOUNTER — Other Ambulatory Visit

## 2024-02-05 ENCOUNTER — Telehealth: Admitting: Primary Care

## 2024-02-06 DIAGNOSIS — J440 Chronic obstructive pulmonary disease with acute lower respiratory infection: Secondary | ICD-10-CM | POA: Diagnosis not present

## 2024-02-06 DIAGNOSIS — I10 Essential (primary) hypertension: Secondary | ICD-10-CM | POA: Diagnosis not present

## 2024-02-08 ENCOUNTER — Ambulatory Visit
Admission: RE | Admit: 2024-02-08 | Discharge: 2024-02-08 | Disposition: A | Discharge status: Home / Self Care | Source: Ambulatory Visit | Attending: Internal Medicine | Admitting: Internal Medicine

## 2024-02-08 DIAGNOSIS — Z87891 Personal history of nicotine dependence: Secondary | ICD-10-CM

## 2024-02-08 DIAGNOSIS — J439 Emphysema, unspecified: Secondary | ICD-10-CM

## 2024-02-08 DIAGNOSIS — R0609 Other forms of dyspnea: Secondary | ICD-10-CM

## 2024-02-08 DIAGNOSIS — Z01811 Encounter for preprocedural respiratory examination: Secondary | ICD-10-CM

## 2024-02-08 DIAGNOSIS — R0989 Other specified symptoms and signs involving the circulatory and respiratory systems: Secondary | ICD-10-CM

## 2024-02-08 DIAGNOSIS — R911 Solitary pulmonary nodule: Secondary | ICD-10-CM | POA: Diagnosis not present

## 2024-02-08 DIAGNOSIS — I700 Atherosclerosis of aorta: Secondary | ICD-10-CM | POA: Diagnosis not present

## 2024-02-08 DIAGNOSIS — R0602 Shortness of breath: Secondary | ICD-10-CM | POA: Diagnosis not present

## 2024-02-08 DIAGNOSIS — R053 Chronic cough: Secondary | ICD-10-CM

## 2024-02-11 ENCOUNTER — Institutional Professional Consult (permissible substitution): Admitting: Internal Medicine

## 2024-02-16 DIAGNOSIS — K621 Rectal polyp: Secondary | ICD-10-CM | POA: Diagnosis not present

## 2024-02-16 DIAGNOSIS — Z860101 Personal history of adenomatous and serrated colon polyps: Secondary | ICD-10-CM | POA: Diagnosis not present

## 2024-02-16 DIAGNOSIS — K5730 Diverticulosis of large intestine without perforation or abscess without bleeding: Secondary | ICD-10-CM | POA: Diagnosis not present

## 2024-02-16 DIAGNOSIS — D122 Benign neoplasm of ascending colon: Secondary | ICD-10-CM | POA: Diagnosis not present

## 2024-02-16 DIAGNOSIS — D123 Benign neoplasm of transverse colon: Secondary | ICD-10-CM | POA: Diagnosis not present

## 2024-02-16 DIAGNOSIS — Z09 Encounter for follow-up examination after completed treatment for conditions other than malignant neoplasm: Secondary | ICD-10-CM | POA: Diagnosis not present

## 2024-02-18 DIAGNOSIS — R350 Frequency of micturition: Secondary | ICD-10-CM | POA: Diagnosis not present

## 2024-02-18 DIAGNOSIS — R351 Nocturia: Secondary | ICD-10-CM | POA: Diagnosis not present

## 2024-02-18 DIAGNOSIS — R3915 Urgency of urination: Secondary | ICD-10-CM | POA: Diagnosis not present

## 2024-02-19 DIAGNOSIS — M48061 Spinal stenosis, lumbar region without neurogenic claudication: Secondary | ICD-10-CM | POA: Diagnosis not present

## 2024-02-19 DIAGNOSIS — M4727 Other spondylosis with radiculopathy, lumbosacral region: Secondary | ICD-10-CM | POA: Diagnosis not present

## 2024-02-19 DIAGNOSIS — Z79899 Other long term (current) drug therapy: Secondary | ICD-10-CM | POA: Diagnosis not present

## 2024-02-19 DIAGNOSIS — Z6827 Body mass index (BMI) 27.0-27.9, adult: Secondary | ICD-10-CM | POA: Diagnosis not present

## 2024-02-22 ENCOUNTER — Other Ambulatory Visit: Payer: Self-pay | Admitting: Internal Medicine

## 2024-02-22 MED ORDER — BREZTRI AEROSPHERE 160-9-4.8 MCG/ACT IN AERO: 2.0000 | INHALATION_SPRAY | Freq: Two times a day (BID) | RESPIRATORY_TRACT | 3 refills | Status: DC

## 2024-02-24 DIAGNOSIS — Z79899 Other long term (current) drug therapy: Secondary | ICD-10-CM | POA: Diagnosis not present

## 2024-02-24 DIAGNOSIS — J440 Chronic obstructive pulmonary disease with acute lower respiratory infection: Secondary | ICD-10-CM | POA: Diagnosis not present

## 2024-02-24 DIAGNOSIS — I10 Essential (primary) hypertension: Secondary | ICD-10-CM | POA: Diagnosis not present

## 2024-03-15 DIAGNOSIS — I10 Essential (primary) hypertension: Secondary | ICD-10-CM | POA: Diagnosis not present

## 2024-03-15 DIAGNOSIS — J440 Chronic obstructive pulmonary disease with acute lower respiratory infection: Secondary | ICD-10-CM | POA: Diagnosis not present

## 2024-03-17 DIAGNOSIS — M48061 Spinal stenosis, lumbar region without neurogenic claudication: Secondary | ICD-10-CM | POA: Diagnosis not present

## 2024-03-17 DIAGNOSIS — Z79899 Other long term (current) drug therapy: Secondary | ICD-10-CM | POA: Diagnosis not present

## 2024-03-17 DIAGNOSIS — Z6826 Body mass index (BMI) 26.0-26.9, adult: Secondary | ICD-10-CM | POA: Diagnosis not present

## 2024-03-17 DIAGNOSIS — M4727 Other spondylosis with radiculopathy, lumbosacral region: Secondary | ICD-10-CM | POA: Diagnosis not present

## 2024-03-22 DIAGNOSIS — Z79899 Other long term (current) drug therapy: Secondary | ICD-10-CM | POA: Diagnosis not present

## 2024-03-24 ENCOUNTER — Other Ambulatory Visit: Payer: Self-pay

## 2024-03-24 ENCOUNTER — Encounter: Payer: Self-pay | Admitting: Primary Care

## 2024-03-24 ENCOUNTER — Ambulatory Visit (INDEPENDENT_AMBULATORY_CARE_PROVIDER_SITE_OTHER): Admitting: Primary Care

## 2024-03-24 VITALS — BP 156/82 | HR 69 | Temp 97.7°F | Ht 66.0 in | Wt 174.0 lb

## 2024-03-24 DIAGNOSIS — Z122 Encounter for screening for malignant neoplasm of respiratory organs: Secondary | ICD-10-CM

## 2024-03-24 DIAGNOSIS — R1319 Other dysphagia: Secondary | ICD-10-CM | POA: Diagnosis not present

## 2024-03-24 DIAGNOSIS — Z87891 Personal history of nicotine dependence: Secondary | ICD-10-CM

## 2024-03-24 DIAGNOSIS — J449 Chronic obstructive pulmonary disease, unspecified: Secondary | ICD-10-CM

## 2024-03-24 DIAGNOSIS — J439 Emphysema, unspecified: Secondary | ICD-10-CM | POA: Diagnosis not present

## 2024-03-24 HISTORY — DX: Chronic obstructive pulmonary disease, unspecified: J44.9

## 2024-03-24 MED ORDER — BREZTRI AEROSPHERE 160-9-4.8 MCG/ACT IN AERO: 2.0000 | INHALATION_SPRAY | Freq: Two times a day (BID) | RESPIRATORY_TRACT | 5 refills | Status: DC

## 2024-03-24 MED ORDER — BREZTRI AEROSPHERE 160-9-4.8 MCG/ACT IN AERO: 2.0000 | INHALATION_SPRAY | Freq: Two times a day (BID) | RESPIRATORY_TRACT | Status: DC

## 2024-03-24 NOTE — Progress Notes (Signed)
 @Patient  ID: Randall [name], adult    DOB: September 08, 1946, 78 y.o.   MRN: [id number]  No chief complaint on file.   Referring provider: Allan Ishihara, MD  HPI: 28 year of male, former smoker quit 40 years ago. PMH significant for chronic lower back pain, post cervical fusion, restless leg syndrome, OPD.   03/24/2024 Discussed the use of AI scribe software for clinical note transcription with the patient, who gave verbal consent to proceed.  Patient presents today for 8-week follow-up for emphysema, chronic cough and dyspnea on exertion.  He underwent a breathing test, CT scan, and lab work in March. His medication was changed from Symbicort to Breztri , which improved his breathing. However, he ran out of Breztri  samples a month ago and resumed using Symbicort.  CT chest was negative for pulmonary fibrosis, however, it did show debris in his airways with chronic volume loss right lower lobe which may be from chronic aspiration.  Small right lower lobe nodule measuring 3.8 mm which is minimally increased from January 2020 and likely benign. If further evaluation is desired, follow-up in 1 year is recommended. Emphysema.  Pulmonary function testing in March 2025 showed severe obstructive airway disease with minimal diffusion defect/FEV1 50%.  He occasionally experiences trouble swallowing but denies frequent coughing after eating. He experiences wheezing, which he attributes to his throat, and occasional coughing without significant sputum production.  No symptoms of acid reflux such as burping or belching. He occasionally uses Mucinex when his cough worsens and does not use an incentive spirometer for breathing exercises.      No Known Allergies  Immunization History  Administered Date(s) Administered   Fluad Quad(high Dose 65+) 07/04/2019   Influenza, High Dose Seasonal PF 07/10/2017, 07/07/2018   PFIZER(Purple Top)SARS-COV-2 Vaccination 12/25/2019, 01/24/2020    Past Medical History:   Diagnosis Date   Kidney stones    1 removed by litho. 1 by Cysto   RLS (restless legs syndrome)     Tobacco History: Social History   Tobacco Use  Smoking Status Former   Current packs/day: 1.00   Average packs/day: 1 pack/day for 40.0 years (40.0 ttl pk-yrs)   Types: Cigarettes  Smokeless Tobacco Never  Tobacco Comments   Encouraged to remain smoke free.   Counseling given: Not Answered Tobacco comments: Encouraged to remain smoke free.   Outpatient Medications Prior to Visit  Medication Sig Dispense Refill   amLODipine (NORVASC) 5 MG tablet Take 5 mg by mouth daily.     aspirin  81 MG chewable tablet 1 tablet     augmented betamethasone  dipropionate (DIPROLENE -AF) 0.05 % cream Apply 1 application topically daily.     budeson-glycopyrrolate -formoterol (BREZTRI  AEROSPHERE) 160-9-4.8 MCG/ACT AERO inhaler Inhale 2 puffs into the lungs in the morning and at bedtime. 10.7 g 3   losartan (COZAAR) 100 MG tablet 1 tablet     oxycodone  (OXY-IR) 5 MG capsule Take 5 mg by mouth every 6 (six) hours as needed for pain.     pravastatin (PRAVACHOL) 20 MG tablet Take 20 mg by mouth daily.     rOPINIRole  (REQUIP ) 1 MG tablet TAKE 1 TABLET BY MOUTH 1 TO 3 HOURS BEFORE BEDTIME ORALLY AT BEDTIME 90 DAYS     SYMBICORT 160-4.5 MCG/ACT inhaler Inhale into the lungs.     tadalafil (CIALIS) 5 MG tablet Take 5 mg by mouth daily.     No facility-administered medications prior to visit.      Review of Systems  Review of Systems  Constitutional:  Negative.   HENT: Negative.    Respiratory:  Positive for cough and wheezing.   Cardiovascular: Negative.      Physical Exam  There were no vitals taken for this visit. Physical Exam Constitutional:      General: He is not in acute distress.    Appearance: Normal appearance. He is not ill-appearing.  HENT:     Head: Normocephalic and atraumatic.  Cardiovascular:     Rate and Rhythm: Normal rate and regular rhythm.  Pulmonary:     Effort:  Pulmonary effort is normal.     Breath sounds: Wheezing present. No rhonchi or rales.  Musculoskeletal:        General: Normal range of motion.     Cervical back: Normal range of motion and neck supple.  Skin:    General: Skin is warm and dry.  Neurological:     General: No focal deficit present.     Mental Status: He is oriented to person, place, and time.  Psychiatric:        Mood and Affect: Mood normal.        Behavior: Behavior normal.        Thought Content: Thought content normal.        Judgment: Judgment normal.      Lab Results:  CBC    Component Value Date/Time   WBC 7.0 05/10/2014 0921   RBC 4.56 05/10/2014 0921   HGB 15.0 05/10/2014 0921   HCT 44.6 05/10/2014 0921   PLT 235 05/10/2014 0921   MCV 97.8 05/10/2014 0921   MCH 32.9 05/10/2014 0921   MCHC 33.6 05/10/2014 0921   RDW 13.0 05/10/2014 0921    BMET    Component Value Date/Time   NA 141 05/10/2014 0921   K 4.3 05/10/2014 0921   CL 103 05/10/2014 0921   CO2 27 05/10/2014 0921   GLUCOSE 94 05/10/2014 0921   BUN 16 05/10/2014 0921   CREATININE 1.10 05/10/2014 0921   CALCIUM 9.2 05/10/2014 0921   GFRNONAA 68 (L) 05/10/2014 0921   GFRAA 78 (L) 05/10/2014 0921    BNP No results found for: "BNP"  ProBNP No results found for: "PROBNP"  Imaging: No results found.   Assessment & Plan:   1. Other dysphagia (Primary) - SLP modified barium swallow; Future  2. Stopped smoking with greater than 40 pack year history - Ambulatory Referral for Lung Cancer Scre  3. History of emphysema (HCC) - Ambulatory Referral for DME  4. Stage 3 severe COPD by GOLD classification (HCC) - Ambulatory Referral for DME   Assessment and Plan    Severe COPD with emphysema  Severe COPD, classified as stage 3 with lung function at 50% predicted. Mild diffusion defect. Symptoms include wheezing and chronic cough. Breztri  is prescribed to manage symptoms. Patient prefers Breztri  over Symbicort. Mucinex can be used  as needed to thin mucus and aid in expectoration. Deep breathing exercises recommended to address volume loss in the right lung. - Provide Breztri  samples and send prescription - Recommend Mucinex as needed to loosen congestion - Provide incentive spirometer for deep breathing exercises  Dysphagia  Esophagus was unremarkable on CT imaging  - Order barium swallow study to assess for aspiration  Smoking Smoking increases risk for lung cancer. CT scan shows a very small nodule (3.8 mm) in the right lower lobe, minimally changed since 2020, likely benign. Consideration for lung cancer screening program due to smoking history. - Refer to lung cancer screening program if eligible -  Order repeat CT scan in one year     Antonio Baumgarten, NP 03/24/2024

## 2024-03-24 NOTE — Patient Instructions (Addendum)
-  SEVERE COPD: Chronic Obstructive Pulmonary Disease (COPD) is a lung condition that makes it hard to breathe. Your COPD is severe, with lung function at about 50% of what is expected. We will continue with Breztri  to help manage your symptoms, and you can use Mucinex as needed to thin mucus. We also recommend using an incentive spirometer for deep breathing exercises to help with the volume loss in your right lung. Additionally, we will order a barium swallow study to check for any swallowing issues that might be causing aspiration.  -EMPHYSEMA: Emphysema is a type of COPD where the air sacs in the lungs are damaged, leading to breathing difficulties. This was confirmed by your CT scan. We will continue to manage this as part of your COPD treatment.  -SMOKING: Your history of smoking increases your risk for lung cancer. A small nodule in your right lower lung has shown minimal change since 2020, which is a good sign. We will refer you to a lung cancer screening program if you are eligible and order a repeat CT scan in one year to monitor the nodule.   INSTRUCTIONS: Please follow up with the barium swallow study as ordered. Continue using Breztri  as prescribed and use Mucinex as needed. Practice deep breathing exercises with the incentive spirometer. We will schedule a repeat CT scan in one year to monitor the lung nodule. If you are eligible, we will refer you to a lung cancer screening program.  Orders: Refer to lung cancer screening program or CT chest in 1 year (ordered) Sample of Breztri  and send RX   Follow-up 6 months with Dr. Bertrum Brodie or sooner if needed

## 2024-03-24 NOTE — Addendum Note (Signed)
 Addended by: Sirus Labrie T on: 03/24/2024 09:30 AM   Modules accepted: Orders

## 2024-03-26 DIAGNOSIS — I10 Essential (primary) hypertension: Secondary | ICD-10-CM | POA: Diagnosis not present

## 2024-03-26 DIAGNOSIS — J440 Chronic obstructive pulmonary disease with acute lower respiratory infection: Secondary | ICD-10-CM | POA: Diagnosis not present

## 2024-04-14 ENCOUNTER — Telehealth: Payer: Self-pay | Admitting: Orthopedic Surgery

## 2024-04-14 ENCOUNTER — Ambulatory Visit (INDEPENDENT_AMBULATORY_CARE_PROVIDER_SITE_OTHER): Admitting: Orthopedic Surgery

## 2024-04-14 DIAGNOSIS — M5416 Radiculopathy, lumbar region: Secondary | ICD-10-CM | POA: Diagnosis not present

## 2024-04-14 NOTE — Telephone Encounter (Signed)
 Can you please schedule patient see Dr Sulema Endo sooner rather than later for spinal stenosis of lumbar spine

## 2024-04-14 NOTE — Telephone Encounter (Signed)
 Scheduled for 330on 03/28/24

## 2024-04-15 DIAGNOSIS — J440 Chronic obstructive pulmonary disease with acute lower respiratory infection: Secondary | ICD-10-CM | POA: Diagnosis not present

## 2024-04-15 DIAGNOSIS — I10 Essential (primary) hypertension: Secondary | ICD-10-CM | POA: Diagnosis not present

## 2024-04-16 ENCOUNTER — Encounter: Payer: Self-pay | Admitting: Orthopedic Surgery

## 2024-04-16 NOTE — Progress Notes (Signed)
 Office Visit Note   Patient: Randall [name]           Date of Birth: [birth year]           MRN: [id number] Visit Date: 04/14/2024 Requested by: Morgan Drivers, MD [address] [place],  [place] [address] PCP: Morgan Drivers, MD  Subjective: Chief Complaint  Patient presents with   Lower Back - Pain    HPI: Randall [name] is a 78 y.o. male who presents to the office reporting low back pain.  Had epidural steroid injection in 06/16/2023 without relief.  Does report bilateral lower extremity weakness and giving way.  States he almost fell 3 months ago while feeding the dogs.  Has severe pain in the morning.  Describes that his knees hyperextend when he stands and walks.  Most of his pain is in the morning.  Denies any neck pain or groin pain.  Does feel a lot of pressure in the middle of his lower back.  Did do physical therapy extensively within the past year without much help.  Did have an MRI scan done in 2024.  That scan shows moderate to severe bilateral foraminal stenosis at L3-4 which matches his hip flexion weakness..                ROS: All systems reviewed are negative as they relate to the chief complaint within the history of present illness.  Patient denies fevers or chills.  Assessment & Plan: Visit Diagnoses:  1. Lumbar radiculopathy     Plan: Impression is significant gait abnormality with foraminal and spinal stenosis.  MRI scan was done in September.  This looks operative.  He does not really want to live with his current level of symptoms.  Surgical consultation indicated.  He has failed conservative measures.  Follow-up with Dr. Georgina sooner rather than later.  Follow-Up Instructions: No follow-ups on file.   Orders:  No orders of the defined types were placed in this encounter.  No orders of the defined types were placed in this encounter.     Procedures: No procedures performed   Clinical Data: No additional findings.  Objective: Vital Signs: There  were no vitals taken for this visit.  Physical Exam:  Constitutional: Patient appears well-developed HEENT:  Head: Normocephalic Eyes:EOM are normal Neck: Normal range of motion Cardiovascular: Normal rate Pulmonary/chest: Effort normal Neurologic: Patient is alert Skin: Skin is warm Psychiatric: Patient has normal mood and affect  Ortho Exam: Ortho exam demonstrates hip flexion weakness 5- out of 5 bilaterally.  Quad strength is 5 out of 5 with ankle dorsiflexion plantarflexion strength 5+ out of 5 bilaterally.  Reflexes 1 out of 4 bilateral patella and Achilles with no clonus.  Mild pain with forward and lateral bending.  Does have a little bit of knee hyperextension when he walks.  No groin pain with internal or external rotation of either leg.  Specialty Comments:  CLINICAL DATA:  Chronic low back pain with bilateral radiculopathy   EXAM: MRI LUMBAR SPINE WITHOUT CONTRAST   TECHNIQUE: Multiplanar, multisequence MR imaging of the lumbar spine was performed. No intravenous contrast was administered.   COMPARISON:  MRI 03/16/2021   FINDINGS: Segmentation:  Standard.   Alignment:  Physiologic.   Vertebrae: No fracture, evidence of discitis, or bone lesion. Minimal discogenic endplate changes at L3-4.   Conus medullaris and cauda equina: Conus extends to the T12-L1 level. Conus and cauda equina appear normal.   Paraspinal and other soft tissues:  Numerous bilateral renal cysts, which do not require dedicated follow-up imaging.   Disc levels:   T12-L1: No significant disc protrusion, foraminal stenosis, or canal stenosis. Unchanged.   L1-L2: Minimal disc bulge and mild facet hypertrophy. No foraminal or canal stenosis. Unchanged.   L2-L3: Mild annular disc bulge and bilateral facet hypertrophy. No foraminal or canal stenosis. Unchanged.   L3-L4: Prior decompression. Disc height loss with diffuse disc bulge. Bilateral facet arthropathy. Bilateral subarticular  recess stenosis with mild canal stenosis. Moderate-severe bilateral foraminal stenosis is worse on the left. Findings have slightly progressed.   L4-L5: Prior decompression. Diffuse disc bulge with shallow central disc protrusion. Bilateral facet arthropathy. Mild bilateral subarticular recess stenosis, left greater than right. No significant canal stenosis. Moderate bilateral foraminal stenosis. No significant interval progression.   L5-S1: Shallow central disc protrusion. Mild facet hypertrophy. No foraminal or canal stenosis. Unchanged.   IMPRESSION: 1. Multilevel lumbar spondylosis, slightly progressed at the L3-4 level where there is mild canal stenosis and moderate-severe bilateral foraminal stenosis. 2. Moderate bilateral foraminal stenosis at L4-5, similar to prior.     Electronically Signed   By: Mabel Converse D.O.   On: 07/07/2023 09:36  Imaging: No results found.   PMFS History: Patient Active Problem List   Diagnosis Date Noted   Stage 3 severe COPD by GOLD classification (HCC) 03/24/2024   Restless leg syndrome 04/24/2021   Spondylosis without myelopathy or radiculopathy, lumbar region 04/24/2021   Other intervertebral disc degeneration, lumbar region 04/24/2021   Uncomplicated opioid use 05/31/2018   Post laminectomy syndrome 01/17/2017   Ischial bursitis of right side 01/15/2017   Pain in right hip 01/15/2017   Chronic bilateral low back pain without sciatica 01/15/2017   HNP (herniated nucleus pulposus), lumbar 05/15/2014   S/P cervical spinal fusion 12/19/2011    Class: Diagnosis of   Pseudarthrosis following spinal fusion 12/19/2011    Class: Diagnosis of   Past Medical History:  Diagnosis Date   Kidney stones    1 removed by litho. 1 by Cysto   RLS (restless legs syndrome)    Stage 3 severe COPD by GOLD classification (HCC) 03/24/2024    Family History  Problem Relation Age of Onset   Anesthesia problems Neg Hx     Past Surgical History:   Procedure Laterality Date   Cervial  May 2012   4-5, 6-7   LUMBAR LAMINECTOMY N/A 05/15/2014   Procedure: MICRODISCECTOMY LUMBAR LAMINECTOMY;  Surgeon: Oneil JAYSON Herald, MD;  Location: [facility] OR;  Service: Orthopedics;  Laterality: N/A;  L3-4, L4-5 Decompression, Left L3-4, L4-5 Microdiscectomy   POSTERIOR CERVICAL FUSION/FORAMINOTOMY  12/19/2011   Procedure: POSTERIOR CERVICAL FUSION/FORAMINOTOMY LEVEL 2;  Surgeon: Oneil JAYSON Herald, MD;  Location: [facility] OR;  Service: Orthopedics;  Laterality: N/A;  Posterior Cervical Fusion, C4-5, C6-7 Wiring, Vitoss, Iliac aspirate   TONSILLECTOMY     Social History   Occupational History   Not on file  Tobacco Use   Smoking status: Former    Current packs/day: 1.00    Average packs/day: 1 pack/day for 40.0 years (40.0 ttl pk-yrs)    Types: Cigarettes   Smokeless tobacco: Never   Tobacco comments:    Encouraged to remain smoke free.  Substance and Sexual Activity   Alcohol  use: Yes    Comment: occ   Drug use: No   Sexual activity: Not on file

## 2024-04-19 ENCOUNTER — Ambulatory Visit (INDEPENDENT_AMBULATORY_CARE_PROVIDER_SITE_OTHER): Admitting: Orthopedic Surgery

## 2024-04-19 ENCOUNTER — Other Ambulatory Visit (INDEPENDENT_AMBULATORY_CARE_PROVIDER_SITE_OTHER): Payer: Self-pay

## 2024-04-19 ENCOUNTER — Encounter: Payer: Self-pay | Admitting: Orthopedic Surgery

## 2024-04-19 DIAGNOSIS — M5416 Radiculopathy, lumbar region: Secondary | ICD-10-CM

## 2024-04-19 NOTE — Progress Notes (Signed)
 Orthopedic Spine Surgery Office Note  Assessment: Patient is a 78 y.o. male with low back pain that radiates into the bilateral buttocks and lateral thighs.  Has bilateral foraminal stenosis at L3/4 and L4/5 causing radiculopathy   Plan: -Patient has tried PT, Tylenol , Aleve, ESI, oxycodone   - Patient has tried multiple conservative treatments over the last 6 months and his pain has been getting progressively worse, so discussed surgery as a treatment option for him.  After discussion, patient elected to proceed -Patient will next be seen a date of surgery   The patient has symptoms consistent with lumbar radiculopathy. The patient's symptoms were not improving with conservative treatment so operative management was discussed in the form of L3/4 and L4/5 lateral interbody fusion to indirectly decompress the bilateral foramen at those levels. Will plan to supplement the fixation with posterior instrumented spinal fusion from L3-L5. The risks including but not limited to dural tear, nerve root injury, paralysis, pseudarthrosis, persistent pain, infection, bleeding, hardware failure, adjacent segment disease, vascular injury, psoas weakness, lumbar plexus injury, heart attack, death, stroke, fracture, dvt/pe, and need for additional procedures were discussed with the patient. Since this will be an indirect decompression, there is a risk of persistent symptoms which the patient understood after explaining the reason. The benefit of the surgery would be improvement in the patient's radiating leg pain. I explained that back pain relief is not the goal of the surgery and it is not reliably alleviated with this surgery. The alternatives to surgical management were covered with the patient and included continued monitoring, physical therapy, over-the-counter pain medications, ambulatory aids, and activity modification. All the patient's questions were answered to his satisfaction. After this discussion, the  patient expressed understanding and elected to proceed with surgical intervention.     ___________________________________________________________________________   History:  Patient is a 78 y.o. male who presents today for lumbar spine.  Patient has had a history of chronic low back pain but it has gotten acutely worse within the last 6 months.  He feels the pain in his lower back and it radiates into his bilateral buttocks and bilateral lateral thighs.  He does not any pain radiating past the knee.  He is also noticed weakness in his quadriceps and feels that his knee is hyperextending.  He has not found any lasting relief with the conservative treatments tried.  There is no trauma or injury that preceded the onset of his acute worsening of pain.  He has a history of an L4/5 microdiscectomy done with Dr. Barbarann.  This surgery was complicated by a durotomy that required subsequent repair at a second surgery.   Weakness: Yes, extension of the knees feels weaker to him.  No other weakness noted Symptoms of imbalance: Has a history of myelopathy and has felt off balance since that time.  No recent changes in his balance Difficulty with fine motor skills in the hand: Denies Paresthesias and numbness: Denies Bowel or bladder incontinence: Has a 3-year history of urinary incontinence.  No recent changes in bowel or bladder habits Saddle anesthesia: Denies  Treatments tried: PT, Tylenol , Aleve, ESI, oxycodone   Review of systems: Denies fevers and chills, night sweats, unexplained weight loss, history of cancer.  Has had pain that wakes him at night  Past medical history: HLD HTN IBS COPD Chronic pain  Allergies: NKDA  Past surgical history:  L3-5 decompression C4-7 anterior cervical fusion C4/5, C6/7 posterior cervical fusion  Social history: Denies use of nicotine product (smoking, vaping, patches, smokeless)  Alcohol  use: Denies Denies recreational drug use   Physical Exam:  BMI  of 28.1  General: no acute distress, appears stated age Neurologic: alert, answering questions appropriately, following commands Respiratory: unlabored breathing on room air, symmetric chest rise Psychiatric: appropriate affect, normal cadence to speech   MSK (spine):  -Strength exam      Left  Right EHL    5/5  5/5 TA    5/5  5/5 GSC    5/5  5/5 Knee extension  5/5  5/5 Hip flexion   5/5  5/5  -Sensory exam    Sensation intact to light touch in L3-S1 nerve distributions of bilateral lower extremities  -Achilles DTR: 2/4 on the left, 2/4 on the right -Patellar tendon DTR: 2/4 on the left, 2/4 on the right  -Straight leg raise: Negative bilaterally -Clonus: no beats bilaterally -Negative heel-to-shin test -Negative Romberg  -Left hip exam: No pain through range of motion -Right hip exam: No pain through range of motion  Imaging: XRs of the lumbar spine from 04/15/2024 were independently reviewed and interpreted, showing disc height loss with small anterior osteophyte formation at L3/4.  No other significant degenerative changes seen.  Laminectomy defect at L4/5.  No fracture or dislocation seen.  No evidence of instability on flexion/extension views.  MRI of the lumbar spine from 06/19/2023 was independently reviewed and interpreted, showing lateral recess and bilateral foraminal stenosis at L3/4.  Bilateral foraminal stenosis at L4/5.  Laminectomy defect at L4/5.  No other significant stenosis seen.  DDD at L3/4.   Patient name: Randall [name] Patient MRN: [id number] Date of visit: 04/19/24

## 2024-04-21 DIAGNOSIS — M4727 Other spondylosis with radiculopathy, lumbosacral region: Secondary | ICD-10-CM | POA: Diagnosis not present

## 2024-04-21 DIAGNOSIS — Z79899 Other long term (current) drug therapy: Secondary | ICD-10-CM | POA: Diagnosis not present

## 2024-04-21 DIAGNOSIS — Z6826 Body mass index (BMI) 26.0-26.9, adult: Secondary | ICD-10-CM | POA: Diagnosis not present

## 2024-04-21 DIAGNOSIS — M48061 Spinal stenosis, lumbar region without neurogenic claudication: Secondary | ICD-10-CM | POA: Diagnosis not present

## 2024-04-25 DIAGNOSIS — J449 Chronic obstructive pulmonary disease, unspecified: Secondary | ICD-10-CM | POA: Diagnosis not present

## 2024-04-25 DIAGNOSIS — I779 Disorder of arteries and arterioles, unspecified: Secondary | ICD-10-CM | POA: Diagnosis not present

## 2024-04-25 DIAGNOSIS — M51362 Other intervertebral disc degeneration, lumbar region with discogenic back pain and lower extremity pain: Secondary | ICD-10-CM | POA: Diagnosis not present

## 2024-04-25 DIAGNOSIS — Z6831 Body mass index (BMI) 31.0-31.9, adult: Secondary | ICD-10-CM | POA: Diagnosis not present

## 2024-04-25 DIAGNOSIS — I714 Abdominal aortic aneurysm, without rupture, unspecified: Secondary | ICD-10-CM | POA: Diagnosis not present

## 2024-04-25 DIAGNOSIS — I10 Essential (primary) hypertension: Secondary | ICD-10-CM | POA: Diagnosis not present

## 2024-04-25 DIAGNOSIS — Z79899 Other long term (current) drug therapy: Secondary | ICD-10-CM | POA: Diagnosis not present

## 2024-04-28 ENCOUNTER — Telehealth: Payer: Self-pay | Admitting: Internal Medicine

## 2024-04-28 NOTE — Telephone Encounter (Signed)
 Fax received from Dr. Ozell Ada with [facility] to perform a L3-5 POSTERIOR LATERAL INTERBIDY FUSION AND L3-5 POSTERIOR INSTRUMENTAL SPINAL FUSION under general anesthesia on patient.  Patient needs surgery clearance. Surgery is pending. Patient was seen on 03/24/24. Office protocol is a risk assessment can be sent to surgeon if patient has been seen in 60 days or less.   Sending to [facility] for risk assessment or recommendations if patient needs to be seen in office prior to surgical procedure.

## 2024-04-28 NOTE — Telephone Encounter (Signed)
 Patient with an intermediate risk for prolonged mechanical ventilation and/or postop complications due to underlying COPD, emphysema and current smoking status.  Ultimate clearance to be started on by anesthesiologist and general surgeon.  Recommend surgery be done in hospital setting.   Major Pulmonary risks identified in the multifactorial risk analysis are but not limited to a) pneumonia; b) recurrent intubation risk; c) prolonged or recurrent acute respiratory failure needing mechanical ventilation; d) prolonged hospitalization; e) DVT/Pulmonary embolism; f) Acute Pulmonary edema  Recommend 1. Short duration of surgery as much as possible and avoid paralytic if possible 2. Recovery in step down or ICU with Pulmonary consultation if needed  3. DVT prophylaxis 4. Aggressive pulmonary toilet with o2, bronchodilatation, and incentive spirometry and early ambulation   . 1) RISK FOR PROLONGED MECHANICAL VENTILAION - > 48h  1A) Arozullah - Prolonged mech ventilation risk Arozullah Postperative Pulmonary Risk Score - for mech ventilation dependence >48h USAA, Ann Surg 2000, major non-cardiac surgery) Comment Score  Type of surgery - abd ao aneurysm (27), thoracic (21), neurosurgery / upper abdominal / vascular (21), neck (11) L3-5 disc fusion 8  Emergency Surgery - (11)  0  ALbumin < 3 or poor nutritional state - (9)  0  BUN > 30 -  (8)  0  Partial or completely dependent functional status - (7)  0  COPD -  (6)  6  Age - 60 to 69 (4), > 70  (6)  6  TOTAL  20  Risk Stratifcation scores  - < 10 (0.5%), 11-19 (1.8%), 20-27 (4.2%), 28-40 (10.1%), >40 (26.6%)  4.2% for mech ventilation      1B) GUPTA - Prolonged Mech Vent Risk Score source Risk  Guptal post op prolonged mech ventilation > 48h or reintubation < 30 days - ACS 2007-2008 dataset - [email or web address] 1.4 % Risk of mechanical ventilation for >48 hrs after surgery, or  unplanned intubation <=30 days of surgery    2) RISK FOR POST OP PNEUMONIA Score source Risk  Charlanne - Post Op Pnemounia risk  [email or web address] 1.1 % Risk of postoperative pneumonia    R3) ISK FOR ANY POST-OP PULMONARY COMPLICATION Score source Risk  CANET/ARISCAT Score - risk for ANY/ALl pulmonary complications - > risk of in-hospital post-op pulmonary complications (composite including respiratory failure, respiratory infection, pleural effusion, atelectasis, pneumothorax, bronchospasm, aspiration pneumonitis) [employer] - based on age, anemia, pulse ox, resp infection prior 30d, incision site, duration of surgery, and emergency v elective surgery Intermediate risk 13.3% risk of in-hospital post-op pulmonary complications (composite including respiratory failure, respiratory infection, pleural effusion, atelectasis, pneumothorax, bronchospasm, aspiration pneumonitis)

## 2024-04-28 NOTE — Telephone Encounter (Signed)
 Faxed copy of this encounter to [facility]

## 2024-05-02 NOTE — Progress Notes (Unsigned)
 OV 01/14/2024  Subjective:  Patient ID: Randall [name], adult , DOB: August 24, 1946 , age 78 y.o. , MRN: [id number] , ADDRESS: [address] [place] [address] PCP Randall Drivers, MD Patient Care Team: Randall Drivers, MD as PCP - General (Family Medicine)  This Provider for this visit: Treatment Team:  Attending Provider: Geronimo Amel, MD    01/14/2024 -   Chief Complaint  Patient presents with   Follow-up    COPD consult      HPI Randall [name] 78 y.o. -new consult referred for possible COPD evaluation.  In 2021 up until then he was undergoing lung cancer screening and then he stopped.  Back then his CT reported some mild emphysema.  He states he been aware of emphysema diagnosis for 6 or 7 years and is on Symbicort with which she is compliant.  He says at baseline he can do yard work and get winded but otherwise very minimal shortness of breath and almost no cough.  Then approximately 3 weeks ago he had episode of COPD flareup.  He said it was really bad his pulse ox was 87%.  He just had 1 day of shortness of breath cough and chest tightness and wheezing.  He went to the urgent care got nebulizers and 6-day of steroids.  He was discharged from the urgent care when his pulse ox improved to 92%.  He is significantly improved but he has a residual mild cough with some white sputum but overall he is doing well.  He is a heavy smoker started smoking at age 56 quit in 2017 around age 61.  Smoked 1 pack/day.  There is no family history of COPD or lung disease.  He is a retired Programmer, [employer].  He has past medical history of high blood pressure and hyperlipidemia and COPD from 6 or 7 years ago.  He is had back surgery.  No MI no stroke no cancer\   He has upcoming colonoscopy in 1 month and he wants preoperative clearance for that.      CT Chest data from date:*  - personally visualized and independently interpreted : yes - my findings are: agree   Narrative & Impression  CLINICAL DATA:  78 year old male former smoker, quit 3 years ago, with 41 pack-year history of smoking, for follow-up lung cancer screening   EXAM: CT CHEST WITHOUT CONTRAST LOW-DOSE FOR LUNG CANCER SCREENING   TECHNIQUE: Multidetector CT imaging of the chest was performed following the standard protocol without IV contrast.   COMPARISON:  Low-dose lung cancer screening CT chest dated 11/25/2018   FINDINGS: Cardiovascular: Heart is normal in size.  No pericardial effusion.   No evidence of thoracic aortic aneurysm. Atherosclerotic calcifications of the aortic arch.   Three vessel coronary atherosclerosis.   Mediastinum/Nodes: No suspicious mediastinal lymphadenopathy.   Visualized thyroid  is unremarkable.   Lungs/Pleura: Mild centrilobular and paraseptal emphysematous changes, upper lung predominant.   No focal consolidation.   Platelike scarring/atelectasis in the right lower lobe. This obscures the prior right lower lobe nodule.   2.5 mm calcified granuloma in the right upper lobe, benign.   Chronic left pleural fluid/thickening.   No pneumothorax.   Upper Abdomen: Visualized upper abdomen is notable for a 4.6 cm posterior left upper pole renal cyst and vascular calcifications.   Musculoskeletal: Mild degenerative changes of the visualized thoracolumbar spine. Cervical spine fixation hardware, incompletely visualized.   IMPRESSION: Lung-RADS 1, negative. Continue annual screening with low-dose chest  CT without contrast in 12 months.   Aortic Atherosclerosis (ICD10-I70.0) and Emphysema (ICD10-J43.9).     Electronically Signed   By: Randall [name] M.D.   On: 11/28/2019 13:21      APP VISIT MAY 2025   03/24/2024 Discussed the use of AI scribe software for clinical note transcription with the patient, who gave verbal consent to proceed.  Patient presents today for 8-week follow-up for emphysema, chronic cough and dyspnea on  exertion.  He underwent a breathing test, CT scan, and lab work in March. His medication was changed from Symbicort to Breztri , which improved his breathing. However, he ran out of Breztri  samples a month ago and resumed using Symbicort.  CT chest was negative for pulmonary fibrosis, however, it did show debris in his airways with chronic volume loss right lower lobe which may be from chronic aspiration.  Small right lower lobe nodule measuring 3.8 mm which is minimally increased from January 2020 and likely benign. If further evaluation is desired, follow-up in 1 year is recommended. Emphysema.  Pulmonary function testing in March 2025 showed severe obstructive airway disease with minimal diffusion defect/FEV1 50%.  He occasionally experiences trouble swallowing but denies frequent coughing after eating. He experiences wheezing, which he attributes to his throat, and occasional coughing without significant sputum production.  No symptoms of acid reflux such as burping or belching. He occasionally uses Mucinex when his cough worsens and does not use an incentive spirometer for breathing exercises.   PLAN d. We will continue with Breztri  to help manage your symptoms,  -SMOKING  We will refer you to a lung cancer screening program if you are eligible and order a repeat CT scan in one year to monitor the nodule.    Please follow up with the barium swallow study as ordered.  Follow-up 6 months with Dr. Geronimo or sooner if needed   OV 05/03/2024  Subjective:  Patient ID: Randall [name], male , DOB: 05-01-1946 , age 78 y.o. , MRN: [id number] , ADDRESS: [address] [place] [address] PCP Randall Lamar, MD Patient Care Team: Randall Lamar, MD as PCP - General (Family Medicine) Randall Amel, MD as Consulting Physician (Pulmonary Disease)  This Provider for this visit: Treatment Team:  Attending Provider: Geronimo Amel, MD    05/03/2024 -   Chief Complaint  Patient  presents with   surgical clearance    PCP wanted to get surgical clearance for back surgery in Sept. 2025.     HPI Randall [name] 78 y.o. here for a preoperative evaluation.  Review of the records indicate that orthopedics and Ortho care has seen him and they are planning- a L3-5 POSTERIOR LATERAL INTERBIDY FUSION AND L3-5 POSTERIOR INSTRUMENTAL SPINAL FUSION under general anesthesia.  Patient record the surgery will be 3 hours long.  It would be under general anesthesia.  The surgery scheduled for September 2025.  He did see nurse practitioner in the preoperative request came after that.  The nurse practitioner did do a detailed telephone note giving him the preoperative clearance and doing all the risk assessment scores.  However, he is here for a visit and he tells me that the primary care wanted to visit.  He categorically states that today's visit is for preoperative clearance for the spine surgery.  In the interim he denies any complaints no hospitalizations no new medical problems no surgeries since his last visit with nurse practitioner.  In 2014 he did have back surgery and had no  problems but that was over 10 years ago.  No anesthesia complications.   He continues with his schedule inhalers of BREZTRI .  There are no other new issues.   Detailed preoperative is spent done -this is a new request for new preoperative clearance.  SYMPTOM SCALE -  01/14/2024  Current weight   O2 use ra  Shortness of Breath 0 -> 5 scale with 5 being worst (score 6 If unable to do)  At rest 0  Simple tasks - showers, clothes change, eating, shaving 0  Household (dishes, doing bed, laundry) 2  Shopping 0  Walking level at own pace 0  Walking up Stairs 2  Total (30-36) Dyspnea Score 4  How bad is your cough? 2  How bad is your fatigue 0  How bad is nausea 0  How bad is vomiting?  0  How bad is diarrhea? 0  How bad is anxiety? 00  How bad is depression 0  Any chronic pain - if so where and how bad 0     CT Chest data from date: be;ow  IMPRESSION: 1. No evidence of interstitial lung disease. 2. Endobronchial debris with chronic volume loss in the right lower lobe. Findings may be due to chronic aspiration. 3. 8 mm smoothly marginated right lower lobe nodule, minimally increased in size from 11/25/2018 but likely benign. If further evaluation is desired, follow-up in 1 year is recommended. 4. Aortic atherosclerosis (ICD10-I70.0). Coronary artery calcification. 5. Enlarged pulmonic trunk, indicative of pulmonary arterial hypertension. 6.  Emphysema (ICD10-J43.9).     Electronically Signed   By: Newell Eke M.D.   On: 02/21/2024 15:52    PFT     Latest Ref Rng & Units 01/15/2024   10:23 AM  PFT Results  FVC-Pre L 2.37   FVC-Predicted Pre % 66   FVC-Post L 2.50   FVC-Predicted Post % 70   Pre FEV1/FVC % % 49   Post FEV1/FCV % % 51   FEV1-Pre L 1.17   FEV1-Predicted Pre % 46   FEV1-Post L 1.28   DLCO uncorrected ml/min/mmHg 14.19   DLCO UNC% % 64   DLCO corrected ml/min/mmHg 14.19   DLCO COR %Predicted % 64   DLVA Predicted % 74   TLC L 7.16   TLC % Predicted % 114   RV % Predicted % 191        LAB RESULTS last 96 hours No results found.       has a past medical history of Kidney stones, RLS (restless legs syndrome), and Stage 3 severe COPD by GOLD classification (HCC) (03/24/2024).   reports that he has quit smoking. His smoking use included cigarettes. He has a 40 pack-year smoking history. He has never used smokeless tobacco.  Past Surgical History:  Procedure Laterality Date   Cervial  May 2012   4-5, 6-7   LUMBAR LAMINECTOMY N/A 05/15/2014   Procedure: MICRODISCECTOMY LUMBAR LAMINECTOMY;  Surgeon: Oneil JAYSON Herald, MD;  Location: [facility] OR;  Service: Orthopedics;  Laterality: N/A;  L3-4, L4-5 Decompression, Left L3-4, L4-5 Microdiscectomy   POSTERIOR CERVICAL FUSION/FORAMINOTOMY  12/19/2011   Procedure: POSTERIOR CERVICAL FUSION/FORAMINOTOMY LEVEL 2;   Surgeon: Oneil JAYSON Herald, MD;  Location: [facility] OR;  Service: Orthopedics;  Laterality: N/A;  Posterior Cervical Fusion, C4-5, C6-7 Wiring, Vitoss, Iliac aspirate   TONSILLECTOMY      No Known Allergies  Immunization History  Administered Date(s) Administered   Fluad Quad(high Dose 65+) 07/04/2019   Influenza, High Dose Seasonal PF  07/10/2017, 07/07/2018   PFIZER(Purple Top)SARS-COV-2 Vaccination 12/25/2019, 01/24/2020    Family History  Problem Relation Age of Onset   Anesthesia problems Neg Hx      Current Outpatient Medications:    amLODipine (NORVASC) 5 MG tablet, Take 5 mg by mouth daily., Disp: , Rfl:    aspirin  81 MG chewable tablet, 1 tablet, Disp: , Rfl:    augmented betamethasone  dipropionate (DIPROLENE -AF) 0.05 % cream, Apply 1 application topically daily., Disp: , Rfl:    budeson-glycopyrrolate -formoterol (BREZTRI  AEROSPHERE) 160-9-4.8 MCG/ACT AERO inhaler, Inhale 2 puffs into the lungs in the morning and at bedtime., Disp: 10.7 g, Rfl: 3   budesonide-glycopyrrolate -formoterol (BREZTRI  AEROSPHERE) 160-9-4.8 MCG/ACT AERO inhaler, Inhale 2 puffs into the lungs in the morning and at bedtime., Disp: , Rfl:    budesonide-glycopyrrolate -formoterol (BREZTRI  AEROSPHERE) 160-9-4.8 MCG/ACT AERO inhaler, Inhale 2 puffs into the lungs in the morning and at bedtime., Disp: 10.7 g, Rfl: 5   losartan (COZAAR) 100 MG tablet, 1 tablet, Disp: , Rfl:    oxycodone  (OXY-IR) 5 MG capsule, Take 5 mg by mouth every 6 (six) hours as needed for pain., Disp: , Rfl:    pravastatin (PRAVACHOL) 20 MG tablet, Take 20 mg by mouth daily., Disp: , Rfl:    rOPINIRole  (REQUIP ) 1 MG tablet, TAKE 1 TABLET BY MOUTH 1 TO 3 HOURS BEFORE BEDTIME ORALLY AT BEDTIME 90 DAYS, Disp: , Rfl:    SYMBICORT 160-4.5 MCG/ACT inhaler, Inhale into the lungs., Disp: , Rfl:    tadalafil (CIALIS) 5 MG tablet, Take 5 mg by mouth daily. (Patient not taking: Reported on 03/24/2024), Disp: , Rfl:       Objective:   Vitals:   05/03/24  1537 05/03/24 1538  BP: 132/62 132/62  Pulse: 77   Temp: 98.6 F (37 C)   SpO2:  95%  Weight: 171 lb 3.2 oz (77.7 kg)   Height: 5' 6 (1.676 m)     Estimated body mass index is 27.63 kg/m as calculated from the following:   Height as of this encounter: 5' 6 (1.676 m).   Weight as of this encounter: 171 lb 3.2 oz (77.7 kg).  @WEIGHTCHANGE @  [employer]   05/03/24 1537  Weight: 171 lb 3.2 oz (77.7 kg)     Physical Exam   General: No distress.  O2 at rest: looks well. No o2 Cane present: no Sitting in wheel chair: no Frail: no Obese: no Neuro: Alert and Oriented x 3. GCS 15. Speech normal Psych: Pleasant Resp:  Barrel Chest - no.  Wheeze - no, Crackles - no, No overt respiratory distress CVS: Normal heart sounds. Murmurs - no Ext: Stigmata of Connective Tissue Disease - no HEENT: Normal upper airway. PEERL +. No post nasal drip        Assessment:       ICD-10-CM   1. Preop respiratory exam  Z01.811     2. Stopped smoking with greater than 40 pack year history  Z87.891     3. History of emphysema (HCC)  J43.9     4. Chronic cough  R05.3     5. DOE (dyspnea on exertion)  R06.09     6. Stage 3 severe COPD by GOLD classification (HCC)  J44.9        1) RISK FOR PROLONGED MECHANICAL VENTILAION - > 48h  1A) Arozullah - Prolonged mech ventilation risk Arozullah Postperative Pulmonary Risk Score - for mech ventilation dependence [address] [employer], Ann Surg 2000, major non-cardiac surgery) Comment Score  Type of surgery - abd ao aneurysm (27), thoracic (21), neurosurgery / upper abdominal / vascular (21), neck (11) L3-5 disc fusion 0  Emergency Surgery - (11)  0  ALbumin < 3 or poor nutritional state - (9)  0  BUN > 30 -  (8)  0  Partial or completely dependent functional status - (7)  0  COPD -  (6)  6  Age - 60 to 71 (4), > 70  (6)  6  TOTAL  12  Risk Stratifcation scores  - < 10 (0.5%), 11-19 (1.8%), 20-27 (4.2%), 28-40 (10.1%), >40 (26.6%) LOW  MOoderte risk < 2% for mech ventilation         R3) ISK FOR ANY POST-OP PULMONARY COMPLICATION Score source Risk  CANET/ARISCAT Score - risk for ANY/ALl pulmonary complications - > risk of in-hospital post-op pulmonary complications (composite including respiratory failure, respiratory infection, pleural effusion, atelectasis, pneumothorax, bronchospasm, aspiration pneumonitis) ModelSolar.es - based on age, anemia, pulse ox, resp infection prior 30d, incision site, duration of surgery, and emergency v elective surgery Intermediate risk 34 point sofr 3+ hour surgery 13.3% risk of in-hospital post-op pulmonary complications (composite including respiratory failure, respiratory infection, pleural effusion, atelectasis, pneumothorax, bronchospasm, aspiration pneumonitis)     Risk ameliorating factors are normal nutritional status not on oxygen peripheral nature of the surgery.   Major Pulmonary risks identified in the multifactorial risk analysis are but not limited to a) pneumonia; b) recurrent intubation risk; c) prolonged or recurrent acute respiratory failure needing mechanical ventilation; d) prolonged hospitalization; e) DVT/Pulmonary embolism; f) Acute Pulmonary edema  Recommend 1. Short duration of surgery as much as possible and avoid paralytic if possible 2. Recovery in step down or ICU with Pulmonary consultation 3. DVT prophylaxis 4. Aggressive pulmonary toilet with o2, bronchodilatation, and incentive spirometry and early ambulation      Plan:     Patient Instructions    Pre-operative respiratory examination  - Low moderate risk for prolonged mechanical ventilation after low back spine surgery.  And intermediate risk for any pulmonary complications such as pneumonia after spine surgery. - This risk is not prohibitive and therefore you can undergo surgery  Plan - Okay to undergo spine surgery - Requires  postoperative care in the hospital and early mobilization. - COPD inhalers - Postoperatively requires incentive spirometry and early mobilization.     Follow-up - 6 months nurse practitioner   FOLLOWUP Return in about 6 months (around 11/03/2024) for with any of the APPS.    SIGNATURE    Dr. Dorethia Cave, M.D., F.C.C.P,  Pulmonary and Critical Care Medicine Staff Physician, [facility] Center Director - Interstitial Lung Disease  Program  Pulmonary Fibrosis [facility] at [facility] [place], [place], [address]  Pager: [phone], If no answer or between  15:00h - 7:00h: call [phone] Telephone: [phone]  4:04 PM 05/03/2024

## 2024-05-02 NOTE — Patient Instructions (Signed)
  Pre-operative respiratory examination  - Low moderate risk for prolonged mechanical ventilation after low back spine surgery.  And intermediate risk for any pulmonary complications such as pneumonia after spine surgery. - This risk is not prohibitive and therefore you can undergo surgery  Plan - Okay to undergo spine surgery - Requires postoperative care in the hospital and early mobilization. - COPD inhalers - Postoperatively requires incentive spirometry and early mobilization.     Follow-up - 6 months nurse practitioner

## 2024-05-03 ENCOUNTER — Ambulatory Visit (INDEPENDENT_AMBULATORY_CARE_PROVIDER_SITE_OTHER): Admitting: Internal Medicine

## 2024-05-03 ENCOUNTER — Encounter: Payer: Self-pay | Admitting: Internal Medicine

## 2024-05-03 VITALS — BP 132/62 | HR 77 | Temp 98.6°F | Ht 66.0 in | Wt 171.2 lb

## 2024-05-03 DIAGNOSIS — J439 Emphysema, unspecified: Secondary | ICD-10-CM

## 2024-05-03 DIAGNOSIS — R053 Chronic cough: Secondary | ICD-10-CM | POA: Diagnosis not present

## 2024-05-03 DIAGNOSIS — J449 Chronic obstructive pulmonary disease, unspecified: Secondary | ICD-10-CM

## 2024-05-03 DIAGNOSIS — R0609 Other forms of dyspnea: Secondary | ICD-10-CM | POA: Diagnosis not present

## 2024-05-03 DIAGNOSIS — Z87891 Personal history of nicotine dependence: Secondary | ICD-10-CM | POA: Diagnosis not present

## 2024-05-03 DIAGNOSIS — Z01811 Encounter for preprocedural respiratory examination: Secondary | ICD-10-CM | POA: Diagnosis not present

## 2024-05-04 ENCOUNTER — Telehealth: Payer: Self-pay | Admitting: *Deleted

## 2024-05-04 NOTE — Telephone Encounter (Signed)
 Patient stated that PCP, Dr. Lamar Ng was requesting surgical clearance for upcoming surgery although he was already cleared back in May of 2025 by Almarie Ferrari NP and clearance was faxed to surgeon.  Advised that I would fax it to his PCP.  OV with 2nd surgical clearance by Dr. Geronimo faxed to [phone] after confirming the fax number with the office staff.  Received confirmation fax that it was sent successfully.  Nothing further needed.

## 2024-05-20 ENCOUNTER — Encounter (HOSPITAL_COMMUNITY): Payer: Self-pay

## 2024-05-20 NOTE — Progress Notes (Signed)
 Surgical Instructions   Your procedure is scheduled on Tuesday May 24, 2024. Report to [facility] Main Entrance A at 6:45 A.M., then check in with the Admitting office. Any questions or running late day of surgery: call [phone]  Questions prior to your surgery date: call [phone], Monday-Friday, 8am-4pm. If you experience any cold or flu symptoms such as cough, fever, chills, shortness of breath, etc. between now and your scheduled surgery, please notify us  at the above number.     Remember:  Do not eat after midnight the night before your surgery  You may drink clear liquids until 5:45 the morning of your surgery.   Clear liquids allowed are: Water, Non-Citrus Juices (without pulp), Carbonated Beverages, Clear Tea (no milk, honey, etc.), Black Coffee Only (NO MILK, CREAM OR POWDERED CREAMER of any kind), and Gatorade.  Patient Instructions  The night before surgery:  No food after midnight. ONLY clear liquids after midnight  The day of surgery (if you do NOT have diabetes):  Drink ONE (1) Pre-Surgery Clear Ensure by 5:45 the morning of surgery. Drink in one sitting. Do not sip.  This drink was given to you during your hospital  pre-op appointment visit.  Nothing else to drink after completing the  Pre-Surgery Clear Ensure.         If you have questions, please contact your surgeon's office.    Take these medicines the morning of surgery with A SIP OF WATER  amLODipine (NORVASC)  budesonide-glycopyrrolate -formoterol (BREZTRI  AEROSPHERE)  pravastatin (PRAVACHOL)   May take these medicines IF NEEDED: albuterol  (PROVENTIL ) (2.5 MG/3ML) 0.083% nebulizer solution  oxyCODONE -acetaminophen  (PERCOCET)   One week prior to surgery, STOP taking any Aspirin  (unless otherwise instructed by your surgeon) Aleve, Naproxen, Ibuprofen, Motrin, Advil, Goody's, BC's, all herbal medications, fish oil, and non-prescription vitamins.                     Do NOT Smoke (Tobacco/Vaping)  for 24 hours prior to your procedure.  If you use a CPAP at night, you may bring your mask/headgear for your overnight stay.   You will be asked to remove any contacts, glasses, piercing's, hearing aid's, dentures/partials prior to surgery. Please bring cases for these items if needed.    Patients discharged the day of surgery will not be allowed to drive home, and someone needs to stay with them for 24 hours.  SURGICAL WAITING ROOM VISITATION Patients may have no more than 2 support people in the waiting area - these visitors may rotate.   Pre-op nurse will coordinate an appropriate time for 1 ADULT support person, who may not rotate, to accompany patient in pre-op.  Children under the age of 64 must have an adult with them who is not the patient and must remain in the main waiting area with an adult.  If the patient needs to stay at the hospital during part of their recovery, the visitor guidelines for inpatient rooms apply.  Please refer to the [facility] website for the visitor guidelines for any additional information.   If you received a COVID test during your pre-op visit  it is requested that you wear a mask when out in public, stay away from anyone that may not be feeling well and notify your surgeon if you develop symptoms. If you have been in contact with anyone that has tested positive in the last 10 days please notify you surgeon.      Pre-operative 5 CHG Bathing Instructions  You can play a key role in reducing the risk of infection after surgery. Your skin needs to be as free of germs as possible. You can reduce the number of germs on your skin by washing with CHG (chlorhexidine  gluconate) soap before surgery. CHG is an antiseptic soap that kills germs and continues to kill germs even after washing.   DO NOT use if you have an allergy to chlorhexidine /CHG or antibacterial soaps. If your skin becomes reddened or irritated, stop using the CHG and notify one of our RNs at  [phone].   Please shower with the CHG soap starting 4 days before surgery using the following schedule:     Please keep in mind the following:  You may shave your face at any point before/day of surgery.  Place clean sheets on your bed the day you start using CHG soap. Use a clean washcloth (not used since being washed) for each shower. DO NOT sleep with pets once you start using the CHG.   CHG Shower Instructions:  Wash your face and private area with normal soap. If you choose to wash your hair, wash first with your normal shampoo.  After you use shampoo/soap, rinse your hair and body thoroughly to remove shampoo/soap residue.  Turn the water OFF and apply about 3 tablespoons (45 ml) of CHG soap to a CLEAN washcloth.  Apply CHG soap ONLY FROM YOUR NECK DOWN TO YOUR TOES (washing for 3-5 minutes)  DO NOT use CHG soap on face, private areas, open wounds, or sores.  Pay special attention to the area where your surgery is being performed.  If you are having back surgery, having someone wash your back for you may be helpful. Wait 2 minutes after CHG soap is applied, then you may rinse off the CHG soap.  Pat dry with a clean towel  Put on clean clothes/pajamas   If you choose to wear lotion, please use ONLY the CHG-compatible lotions that are listed below.  Additional instructions for the day of surgery: DO NOT APPLY any lotions, deodorants or cologne   Do not bring valuables to the hospital. [facility] is not responsible for any belongings/valuables. Do not wear jewelry  Put on clean/comfortable clothes.  Please brush your teeth.  Ask your nurse before applying any prescription medications to the skin.     CHG Compatible Lotions   Aveeno Moisturizing lotion  Cetaphil Moisturizing Cream  Cetaphil Moisturizing Lotion  Clairol Herbal Essence Moisturizing Lotion, Dry Skin  Clairol Herbal Essence Moisturizing Lotion, Extra Dry Skin  Clairol Herbal Essence Moisturizing Lotion,  Normal Skin  Curel Age Defying Therapeutic Moisturizing Lotion with Alpha Hydroxy  Curel Extreme Care Body Lotion  Curel Soothing Hands Moisturizing Hand Lotion  Curel Therapeutic Moisturizing Cream, Fragrance-Free  Curel Therapeutic Moisturizing Lotion, Fragrance-Free  Curel Therapeutic Moisturizing Lotion, Original Formula  Eucerin Daily Replenishing Lotion  Eucerin Dry Skin Therapy Plus Alpha Hydroxy Crme  Eucerin Dry Skin Therapy Plus Alpha Hydroxy Lotion  Eucerin Original Crme  Eucerin Original Lotion  Eucerin Plus Crme Eucerin Plus Lotion  Eucerin TriLipid Replenishing Lotion  Keri Anti-Bacterial Hand Lotion  Keri Deep Conditioning Original Lotion Dry Skin Formula Softly Scented  Keri Deep Conditioning Original Lotion, Fragrance Free Sensitive Skin Formula  Keri Lotion Fast Absorbing Fragrance Free Sensitive Skin Formula  Keri Lotion Fast Absorbing Softly Scented Dry Skin Formula  Keri Original Lotion  Keri Skin Renewal Lotion Keri Silky Smooth Lotion  Keri Silky Smooth Sensitive Skin Lotion  Nivea Body Creamy  Conditioning Oil  Nivea Body Extra Enriched Teacher, adult education Moisturizing Lotion Nivea Crme  Nivea Skin Firming Lotion  NutraDerm 30 Skin Lotion  NutraDerm Skin Lotion  NutraDerm Therapeutic Skin Cream  NutraDerm Therapeutic Skin Lotion  ProShield Protective Hand Cream  Provon moisturizing lotion  Please read over the following fact sheets that you were given.

## 2024-05-20 NOTE — Progress Notes (Signed)
 Anesthesia Chart Review:  Case: [id number] Date/Time: 05/24/24 0830   Procedures:      ANTERIOR LATERAL LUMBAR FUSION 1 LEVEL     POSTERIOR LUMBAR FUSION 2 LEVEL - L3-4, L4-5 EXTREME LATERAL INTERBODY FUSION AND POSTERIOR INSTRUMENTED FUSION     APPLICATION OF O-ARM   Anesthesia type: General   Diagnosis: Lumbar radiculopathy [M54.16]   Pre-op diagnosis: LUMBAR RADICULOPATHY   [facility] ROOM 18 / [facility]   Surgeons: Georgina Ozell LABOR, MD       DISCUSSION: Patient is a 78 year old male scheduled for the above procedure.  History includes former smoker, HTN, HLD, COPD (Gold stage 3 severe COPD), AAA (4.2 cm 10/2023 US ), nephrolithiasis, spinal surgery (C4-7 ACDF, C6 corpectomy 03/12/11; C4-5 & C6-7 posterior fusion 12/19/11; L3-5 decompression/microdiscectomy).  PCP Frederik Charleston, MD evaluated him on 04/25/24 for presurgical clearance. He wrote, Proceed with surgery per his orthopedist.  I recommended however that he be evaluated by his pulmonologist first.  He had pulmonology follow-up and preoperative evaluation on 05/03/24 with Dr. Geronimo.  He has emphysema, chronic cough, and DOE. No evidence of ILD on April 2025 CT. There were findings of endobronchial debris with chronic volume loss in the RLL which could reflect chronic aspiration. There was also an 8 mm RLL nodule minimally increased since 2020 and felt likely benign, but consider 1 year follow-up. His pulmonic trunk was enlarged (09/2021 TTE had normal RV function, no PASP/RVSP values noted)  . PFTs in March 2025 showed severe obstructive airway disease with minimal diffusion defect/FEV1 50%. He is managed with Breztri  and as needed Mucinex. In regards to preoperative pulmonology risk assessment (see [place] - Prolonged mech ventilation risk table outlined in Progress Note) : - Low moderate risk for prolonged mechanical ventilation after low back spine surgery.  And intermediate risk for any pulmonary complications such as pneumonia  after spine surgery. - This risk is not prohibitive and therefore you can undergo surgery   Plan - Okay to undergo spine surgery - Requires postoperative care in the hospital and early mobilization. - COPD inhalers - Postoperatively requires incentive spirometry and early mobilization.  He is not on home O2.   He has also had prior cardiology evaluation by Dr. Francyne on 09/24/21 for palpitations and dizziness.  Symptoms were in the setting of changes in BP.  He did order an echocardiogram 10/11/21 showed LVEF 60-65%, no RWMA, mild concentric LVH, grade 1 DD, normal RV systolic function, trivial MR, mild-moderate AR. As needed cardiology follow-up per note, but he continues to get continued to get yearly US  of AAA testing ordered by Dr. Francyne with latest measurement of 4.2 cm on 11/09/23 with repeat US  in 1 year recommended.   Anesthesia team to evaluate on the day of surgery.    VS: BP (!) 149/81   Pulse 77   Temp 36.8 C   Resp 17   Ht 5' 7 (1.702 m)   Wt 77.5 kg   SpO2 96%   BMI 26.77 kg/m   PROVIDERS: Frederik Charleston, MD is PCP   Geronimo Amel, MD is pulmonologist   LABS: Labs reviewed: Acceptable for surgery. (all labs ordered are listed, but only abnormal results are displayed)  Labs Reviewed  COMPREHENSIVE METABOLIC PANEL WITH GFR - Abnormal; Notable for the following components:      Result Value   Glucose, Bld 109 (*)    Total Protein 6.3 (*)    All other components within normal limits  SURGICAL PCR SCREEN  CBC  TYPE AND SCREEN    PFTs 01/15/24: FVC 2.37 (66%), post 2.50 (70%). FEV1 1.17 (46%), post 1.25 (50%). DLCO unc/cor 14.19 (64%).   IMAGES: CT Chest High Resolution 02/08/24: IMPRESSION: 1. No evidence of interstitial lung disease. 2. Endobronchial debris with chronic volume loss in the right lower lobe. Findings may be due to chronic aspiration. 3. 8 mm smoothly marginated right lower lobe nodule, minimally increased in size from  11/25/2018 but likely benign. If further evaluation is desired, follow-up in 1 year is recommended. 4. Aortic atherosclerosis (ICD10-I70.0). Coronary artery calcification. 5. Enlarged pulmonic trunk, indicative of pulmonary arterial hypertension. 6.  Emphysema (ICD10-J43.9).   EKG: 05/23/24: SB at 57 bpm   CV: US  Abd Aorta 11/09/23: Summary:  Abdominal Aorta: There is evidence of abnormal dilatation of the distal  Abdominal aorta. There is evidence of abnormal dilation of the Right  Common Iliac artery, Left Common Iliac artery and Left External Iliac  artery. The largest aortic measurement is  4.2 cm. Moderate to severe atherosclerosis noted throughout aorta and  iliac arteries. The largest aortic diameter has increased compared to  prior exam. Previous diameter measurement was 3.6 cm obtained on  11/06/2022.  Stenosis:  No evidence of stenosis seen.  - IVC/Iliac: There is no evidence of thrombus involving the IVC.  - Suggest follow up study in 12 months.   Echo 10/11/21: IMPRESSIONS   1. Left ventricular ejection fraction, by estimation, is 60 to 65%. The  left ventricle has normal function. The left ventricle has no regional  wall motion abnormalities. There is mild concentric left ventricular  hypertrophy. Left ventricular diastolic  parameters are consistent with Grade I diastolic dysfunction (impaired  relaxation).   2. Right ventricular systolic function is normal. The right ventricular  size is normal.   3. The mitral valve is normal in structure. Trivial mitral valve  regurgitation. No evidence of mitral stenosis.   4. The aortic valve is tricuspid. There is mild calcification of the  aortic valve. There is mild thickening of the aortic valve. Aortic valve  regurgitation is mild to moderate. Aortic valve sclerosis is present, with  no evidence of aortic valve  stenosis.   5. Aortic dilatation noted. There is mild dilatation of the aortic root,  measuring 39 mm.    6. The inferior vena cava is normal in size with greater than 50%  respiratory variability, suggesting right atrial pressure of 3 mmHg.    US  Carotid 10/24/19: IMPRESSION: Color duplex indicates moderate heterogeneous and calcified plaque, with no hemodynamically significant stenosis by duplex criteria in the extracranial cerebrovascular circulation.   Past Medical History:  Diagnosis Date   AAA (abdominal aortic aneurysm) (HCC)    Hepatitis    HLD (hyperlipidemia)    Hypertension    Kidney stones    1 removed by litho. 1 by Cysto   RLS (restless legs syndrome)    Stage 3 severe COPD by GOLD classification (HCC) 03/24/2024    Past Surgical History:  Procedure Laterality Date   Cervial  May 2012   4-5, 6-7   LUMBAR LAMINECTOMY N/A 05/15/2014   Procedure: MICRODISCECTOMY LUMBAR LAMINECTOMY;  Surgeon: Oneil JAYSON Herald, MD;  Location: [facility] OR;  Service: Orthopedics;  Laterality: N/A;  L3-4, L4-5 Decompression, Left L3-4, L4-5 Microdiscectomy   POSTERIOR CERVICAL FUSION/FORAMINOTOMY  12/19/2011   Procedure: POSTERIOR CERVICAL FUSION/FORAMINOTOMY LEVEL 2;  Surgeon: Oneil JAYSON Herald, MD;  Location: [facility] OR;  Service: Orthopedics;  Laterality: N/A;  Posterior Cervical Fusion, C4-5,  C6-7 Wiring, Vitoss, Iliac aspirate   TONSILLECTOMY      MEDICATIONS:  albuterol  (PROVENTIL ) (2.5 MG/3ML) 0.083% nebulizer solution   amLODipine  (NORVASC ) 5 MG tablet   aspirin  81 MG chewable tablet   augmented betamethasone  dipropionate (DIPROLENE -AF) 0.05 % cream   budesonide -glycopyrrolate -formoterol  (BREZTRI  AEROSPHERE) 160-9-4.8 MCG/ACT AERO inhaler   losartan  (COZAAR ) 100 MG tablet   oxyCODONE -acetaminophen  (PERCOCET) 10-325 MG tablet   pravastatin  (PRAVACHOL ) 20 MG tablet   rOPINIRole  (REQUIP ) 2 MG tablet   No current facility-administered medications for this encounter.   He reported last ASA 05/18/24.   Isaiah Ruder, PA-C Surgical Short Stay/Anesthesiology [facility] Phone [phone] [facility] Phone [phone] 05/23/2024 1:58 PM

## 2024-05-21 DIAGNOSIS — Z6826 Body mass index (BMI) 26.0-26.9, adult: Secondary | ICD-10-CM | POA: Diagnosis not present

## 2024-05-21 DIAGNOSIS — Z79899 Other long term (current) drug therapy: Secondary | ICD-10-CM | POA: Diagnosis not present

## 2024-05-21 DIAGNOSIS — M4727 Other spondylosis with radiculopathy, lumbosacral region: Secondary | ICD-10-CM | POA: Diagnosis not present

## 2024-05-21 DIAGNOSIS — M48061 Spinal stenosis, lumbar region without neurogenic claudication: Secondary | ICD-10-CM | POA: Diagnosis not present

## 2024-05-23 ENCOUNTER — Encounter (HOSPITAL_COMMUNITY): Payer: Self-pay

## 2024-05-23 ENCOUNTER — Encounter (HOSPITAL_COMMUNITY)
Admission: RE | Admit: 2024-05-23 | Discharge: 2024-05-23 | Disposition: A | Discharge status: Home / Self Care | Source: Ambulatory Visit | Attending: Orthopedic Surgery | Admitting: Orthopedic Surgery

## 2024-05-23 ENCOUNTER — Other Ambulatory Visit: Payer: Self-pay

## 2024-05-23 VITALS — BP 149/81 | HR 77 | Temp 98.3°F | Resp 17 | Ht 67.0 in | Wt 170.9 lb

## 2024-05-23 DIAGNOSIS — M5416 Radiculopathy, lumbar region: Secondary | ICD-10-CM | POA: Diagnosis not present

## 2024-05-23 DIAGNOSIS — I10 Essential (primary) hypertension: Secondary | ICD-10-CM | POA: Diagnosis not present

## 2024-05-23 DIAGNOSIS — Z981 Arthrodesis status: Secondary | ICD-10-CM | POA: Diagnosis not present

## 2024-05-23 DIAGNOSIS — M48061 Spinal stenosis, lumbar region without neurogenic claudication: Secondary | ICD-10-CM | POA: Diagnosis not present

## 2024-05-23 DIAGNOSIS — Z01818 Encounter for other preprocedural examination: Secondary | ICD-10-CM | POA: Insufficient documentation

## 2024-05-23 DIAGNOSIS — M5116 Intervertebral disc disorders with radiculopathy, lumbar region: Secondary | ICD-10-CM | POA: Diagnosis not present

## 2024-05-23 DIAGNOSIS — Z87891 Personal history of nicotine dependence: Secondary | ICD-10-CM | POA: Diagnosis not present

## 2024-05-23 DIAGNOSIS — G8929 Other chronic pain: Secondary | ICD-10-CM | POA: Diagnosis not present

## 2024-05-23 DIAGNOSIS — E785 Hyperlipidemia, unspecified: Secondary | ICD-10-CM | POA: Diagnosis not present

## 2024-05-23 DIAGNOSIS — J449 Chronic obstructive pulmonary disease, unspecified: Secondary | ICD-10-CM | POA: Diagnosis not present

## 2024-05-23 HISTORY — DX: Essential (primary) hypertension: I10

## 2024-05-23 HISTORY — DX: Abdominal aortic aneurysm, without rupture, unspecified: I71.40

## 2024-05-23 HISTORY — DX: Inflammatory liver disease, unspecified: K75.9

## 2024-05-23 HISTORY — DX: Hyperlipidemia, unspecified: E78.5

## 2024-05-23 LAB — CBC
HCT: 42.9 % (ref 39.0–52.0)
Hemoglobin: 14.5 g/dL (ref 13.0–17.0)
MCH: 33.4 pg (ref 26.0–34.0)
MCHC: 33.8 g/dL (ref 30.0–36.0)
MCV: 98.8 fL (ref 80.0–100.0)
Platelets: 232 K/uL (ref 150–400)
RBC: 4.34 MIL/uL (ref 4.22–5.81)
RDW: 13.2 % (ref 11.5–15.5)
WBC: 7.9 K/uL (ref 4.0–10.5)
nRBC: 0 % (ref 0.0–0.2)

## 2024-05-23 LAB — COMPREHENSIVE METABOLIC PANEL WITH GFR
ALT: 16 U/L (ref 0–44)
AST: 15 U/L (ref 15–41)
Albumin: 3.8 g/dL (ref 3.5–5.0)
Alkaline Phosphatase: 58 U/L (ref 38–126)
Anion gap: 7 (ref 5–15)
BUN: 19 mg/dL (ref 8–23)
CO2: 26 mmol/L (ref 22–32)
Calcium: 9.3 mg/dL (ref 8.9–10.3)
Chloride: 106 mmol/L (ref 98–111)
Creatinine, Ser: 1.18 mg/dL (ref 0.61–1.24)
GFR, Estimated: >60 mL/min (ref >60)
Glucose, Bld: 109 mg/dL — ABNORMAL HIGH (ref 70–99)
Potassium: 4.2 mmol/L (ref 3.5–5.1)
Sodium: 139 mmol/L (ref 135–145)
Total Bilirubin: 0.8 mg/dL (ref 0.0–1.2)
Total Protein: 6.3 g/dL — ABNORMAL LOW (ref 6.5–8.1)

## 2024-05-23 LAB — TYPE AND SCREEN
ABO/RH(D): A POS
Antibody Screen: NEGATIVE

## 2024-05-23 LAB — SURGICAL PCR SCREEN
MRSA, PCR: NEGATIVE
Staphylococcus aureus: NEGATIVE

## 2024-05-23 NOTE — Progress Notes (Addendum)
 PCP - Lamar Frederik COME Cardiologist - denies Pulmonologist Murali Ramaswamy,MD  PPM/ICD - denies Device Orders -  Rep Notified -   Chest x-ray - CT 02/08/24 EKG - 05/23/24 Stress Test - denies  ECHO - 10/11/21 Cardiac Cath - denies  Sleep Study - denies CPAP -   Fasting Blood Sugar - na Checks Blood Sugar _____ times a day  Last dose of GLP1 agonist-  na GLP1 instructions:   Blood Thinner Instructions:na Aspirin  Instructions:pt states last dose of aspirin  was 05/18/24.  ERAS Protcol - clears until 0545 PRE-SURGERY Ensure or G2- Ensure  COVID TEST- na   Anesthesia review: yes- stage 3 severe COPD. LOV with pulmonologist for clearance 05/03/24.   Patient denies shortness of breath, fever, cough and chest pain at PAT appointment   All instructions explained to the patient, with a verbal understanding of the material. Patient agrees to go over the instructions while at home for a better understanding. The opportunity to ask questions was provided.

## 2024-05-23 NOTE — H&P (Signed)
 Orthopedic Spine Surgery H&P Note  Assessment: Patient is a 78 y.o. male with lumbar radiculopathy   Plan: -Out of bed as tolerated, activity as tolerated, no brace -Covered the risks of surgery one more time with the patient and patient elected to proceed with planned surgery -Written consent verified -Hold anticoagulation in anticipation of surgery - Ancef  and TXA on call to OR -NPO for procedure -Site marked -To OR when ready   The risks covered this morning included but were not limited to: persistent pain, need for additional procedures, durotomy, nerve root injury, paralysis, pseudoarthrosis, infection, bleeding, lumbar plexus injury, hip flexor weakness, anterior lateral thigh numbness, heart attack, death, fracture, DVT/PE, and need for additional procedures. ___________________________________________________________________________  Chief Complaint: Low back pain that radiates into bilateral lower extremities  History: Patient is 78 y.o. male who has been previously seen in the office for low back pain that radiated to the bilateral buttocks and lateral thighs.  Workup was consistent with lumbar radiculopathy.  His symptoms failed to improve with conservative treatment so operative management was discussed at the last office visit. The patient presents today with no changes in his symptoms since the last office visit. See previous office note for further details.    Review of systems: General: denies fevers and chills, myalgias Neurologic: denies recent changes in vision, slurred speech Abdomen: denies nausea, vomiting, hematemesis Respiratory: denies cough, shortness of breath  Past medical history: HLD HTN IBS COPD Chronic pain   Allergies: NKDA   Past surgical history:  L3-5 decompression C4-7 anterior cervical fusion C4/5, C6/7 posterior cervical fusion   Social history: Denies use of nicotine product (smoking, vaping, patches, smokeless) Alcohol  use:  Denies Denies recreational drug use  Family history: -reviewed and not pertinent to lumbar radiculopathy   Physical Exam:  General: no acute distress, appears stated age Neurologic: alert, answering questions appropriately, following commands Cardiovascular: regular rate, no cyanosis Respiratory: unlabored breathing on room air, symmetric chest rise Psychiatric: appropriate affect, normal cadence to speech   MSK (spine):  -Strength exam      Left  Right  EHL    5/5  5/5 TA    5/5  5/5 GSC    5/5  5/5 Knee extension  5/5  5/5 Knee flexion   5/5  5/5 Hip flexion   5/5  5/5  -Sensory exam    Sensation intact to light touch in L3-S1 nerve distributions of bilateral lower extremities   Patient name: Randall [name] Patient MRN: [id number] Date: 05/24/2024

## 2024-05-23 NOTE — Anesthesia Preprocedure Evaluation (Addendum)
 Anesthesia Evaluation  Patient identified by MRN, date of birth, ID band Patient awake    Reviewed: Allergy & Precautions, NPO status , Patient's Chart, lab work & pertinent test results  History of Anesthesia Complications Negative for: history of anesthetic complications  Airway Mallampati: II  TM Distance: >3 FB Neck ROM: Full    Dental  (+) Lower Dentures, Upper Dentures   Pulmonary COPD,  COPD inhaler, former smoker   Pulmonary exam normal        Cardiovascular hypertension, Pt. on medications Normal cardiovascular exam     Neuro/Psych  RLS   negative psych ROS   GI/Hepatic negative GI ROS,,,(+) Hepatitis -  Endo/Other  negative endocrine ROS    Renal/GU Renal InsufficiencyRenal disease     Musculoskeletal negative musculoskeletal ROS (+)    Abdominal   Peds  Hematology negative hematology ROS (+)   Anesthesia Other Findings   Reproductive/Obstetrics                              Anesthesia Physical Anesthesia Plan  ASA: 3  Anesthesia Plan: General   Post-op Pain Management: Tylenol  PO (pre-op)* and Dilaudid  IV   Induction: Intravenous  PONV Risk Score and Plan: 2 and Treatment may vary due to age or medical condition, Ondansetron  and TIVA  Airway Management Planned: Oral ETT and Video Laryngoscope Planned  Additional Equipment: Arterial line  Intra-op Plan:   Post-operative Plan: Extubation in OR  Informed Consent: I have reviewed the patients History and Physical, chart, labs and discussed the procedure including the risks, benefits and alternatives for the proposed anesthesia with the patient or authorized representative who has indicated his/her understanding and acceptance.     Dental advisory given  Plan Discussed with: CRNA and Anesthesiologist  Anesthesia Plan Comments: (PAT note written 05/23/2024 by Allison Zelenak, PA-C)         Anesthesia  Quick Evaluation

## 2024-05-24 ENCOUNTER — Encounter (HOSPITAL_COMMUNITY)
Admission: RE | Disposition: A | Discharge status: Home / Self Care | Payer: Self-pay | Source: Home / Self Care | Attending: Orthopedic Surgery

## 2024-05-24 ENCOUNTER — Inpatient Hospital Stay (HOSPITAL_COMMUNITY): Payer: Self-pay | Admitting: Vascular Surgery

## 2024-05-24 ENCOUNTER — Inpatient Hospital Stay (HOSPITAL_COMMUNITY)

## 2024-05-24 ENCOUNTER — Inpatient Hospital Stay (HOSPITAL_COMMUNITY): Payer: Self-pay | Admitting: Anesthesiology

## 2024-05-24 ENCOUNTER — Other Ambulatory Visit: Payer: Self-pay

## 2024-05-24 ENCOUNTER — Inpatient Hospital Stay (HOSPITAL_COMMUNITY)
Admission: RE | Admit: 2024-05-24 | Discharge: 2024-05-25 | DRG: 428 | Disposition: A | Discharge status: Home / Self Care | Attending: Orthopedic Surgery | Admitting: Orthopedic Surgery

## 2024-05-24 ENCOUNTER — Encounter (HOSPITAL_COMMUNITY): Payer: Self-pay | Admitting: Orthopedic Surgery

## 2024-05-24 DIAGNOSIS — G8929 Other chronic pain: Secondary | ICD-10-CM | POA: Diagnosis present

## 2024-05-24 DIAGNOSIS — M48061 Spinal stenosis, lumbar region without neurogenic claudication: Secondary | ICD-10-CM | POA: Diagnosis present

## 2024-05-24 DIAGNOSIS — M5116 Intervertebral disc disorders with radiculopathy, lumbar region: Secondary | ICD-10-CM | POA: Diagnosis not present

## 2024-05-24 DIAGNOSIS — E785 Hyperlipidemia, unspecified: Secondary | ICD-10-CM | POA: Diagnosis present

## 2024-05-24 DIAGNOSIS — I10 Essential (primary) hypertension: Secondary | ICD-10-CM

## 2024-05-24 DIAGNOSIS — J449 Chronic obstructive pulmonary disease, unspecified: Secondary | ICD-10-CM | POA: Diagnosis present

## 2024-05-24 DIAGNOSIS — Z981 Arthrodesis status: Secondary | ICD-10-CM | POA: Diagnosis not present

## 2024-05-24 DIAGNOSIS — Z01818 Encounter for other preprocedural examination: Principal | ICD-10-CM

## 2024-05-24 DIAGNOSIS — M5416 Radiculopathy, lumbar region: Secondary | ICD-10-CM

## 2024-05-24 DIAGNOSIS — Z87891 Personal history of nicotine dependence: Secondary | ICD-10-CM | POA: Diagnosis not present

## 2024-05-24 HISTORY — PX: ANTERIOR LAT LUMBAR FUSION: SHX1168

## 2024-05-24 LAB — POCT I-STAT 7, (LYTES, BLD GAS, ICA,H+H)
Acid-base deficit: 3 mmol/L — ABNORMAL HIGH (ref 0.0–2.0)
Bicarbonate: 23.9 mmol/L (ref 20.0–28.0)
Calcium, Ion: 1.21 mmol/L (ref 1.15–1.40)
HCT: 44 % (ref 39.0–52.0)
Hemoglobin: 15 g/dL (ref 13.0–17.0)
O2 Saturation: 100 %
Potassium: 4.6 mmol/L (ref 3.5–5.1)
Sodium: 138 mmol/L (ref 135–145)
TCO2: 25 mmol/L (ref 22–32)
pCO2 arterial: 50 mmHg — ABNORMAL HIGH (ref 32–48)
pH, Arterial: 7.286 — ABNORMAL LOW (ref 7.35–7.45)
pO2, Arterial: 197 mmHg — ABNORMAL HIGH (ref 83–108)

## 2024-05-24 SURGERY — ANTERIOR LATERAL LUMBAR FUSION 1 LEVEL
Anesthesia: General

## 2024-05-24 MED ORDER — TRANEXAMIC ACID-NACL 1000-0.7 MG/100ML-% IV SOLN
1000.0000 mg | INTRAVENOUS | Status: AC
  Administered 2024-05-24: 1000 mg via INTRAVENOUS
  Filled 2024-05-24: qty 100

## 2024-05-24 MED ORDER — ALBUTEROL SULFATE (2.5 MG/3ML) 0.083% IN NEBU: 2.5000 mg | INHALATION_SOLUTION | Freq: Four times a day (QID) | RESPIRATORY_TRACT | Status: DC | PRN

## 2024-05-24 MED ORDER — ACETAMINOPHEN 500 MG PO TABS
1000.0000 mg | ORAL_TABLET | Freq: Three times a day (TID) | ORAL | Status: DC
  Administered 2024-05-24 – 2024-05-25 (×2): 1000 mg via ORAL
  Filled 2024-05-24 (×2): qty 2

## 2024-05-24 MED ORDER — LIDOCAINE 2% (20 MG/ML) 5 ML SYRINGE
INTRAMUSCULAR | Status: AC
  Filled 2024-05-24: qty 5

## 2024-05-24 MED ORDER — DEXAMETHASONE SODIUM PHOSPHATE 10 MG/ML IJ SOLN
10.0000 mg | Freq: Once | INTRAMUSCULAR | Status: AC
  Administered 2024-05-24: 10 mg via INTRAVENOUS
  Filled 2024-05-24: qty 1

## 2024-05-24 MED ORDER — HYDROMORPHONE HCL 1 MG/ML IJ SOLN
INTRAMUSCULAR | Status: DC | PRN
  Administered 2024-05-24 (×2): .1 mg via INTRAVENOUS
  Administered 2024-05-24: .3 mg via INTRAVENOUS

## 2024-05-24 MED ORDER — LACTATED RINGERS IV SOLN: INTRAVENOUS | Status: DC

## 2024-05-24 MED ORDER — MIDAZOLAM HCL 2 MG/2ML IJ SOLN
INTRAMUSCULAR | Status: DC | PRN
  Administered 2024-05-24: 2 mg via INTRAVENOUS

## 2024-05-24 MED ORDER — ACETAMINOPHEN 500 MG PO TABS
1000.0000 mg | ORAL_TABLET | Freq: Once | ORAL | Status: DC
  Filled 2024-05-24: qty 2

## 2024-05-24 MED ORDER — SUCCINYLCHOLINE CHLORIDE 200 MG/10ML IV SOSY
PREFILLED_SYRINGE | INTRAVENOUS | Status: AC
  Filled 2024-05-24: qty 10

## 2024-05-24 MED ORDER — POLYETHYLENE GLYCOL 3350 17 G PO PACK
17.0000 g | PACK | Freq: Every day | ORAL | Status: DC
  Administered 2024-05-24: 17 g via ORAL
  Filled 2024-05-24: qty 1

## 2024-05-24 MED ORDER — FENTANYL CITRATE (PF) 250 MCG/5ML IJ SOLN
INTRAMUSCULAR | Status: DC | PRN
  Administered 2024-05-24: 50 ug via INTRAVENOUS

## 2024-05-24 MED ORDER — BUDESON-GLYCOPYRROL-FORMOTEROL 160-9-4.8 MCG/ACT IN AERO
2.0000 | INHALATION_SPRAY | Freq: Two times a day (BID) | RESPIRATORY_TRACT | Status: DC
  Administered 2024-05-25: 2 via RESPIRATORY_TRACT
  Filled 2024-05-24: qty 5.9

## 2024-05-24 MED ORDER — CHLORHEXIDINE GLUCONATE 0.12 % MT SOLN
15.0000 mL | Freq: Once | OROMUCOSAL | Status: AC
  Administered 2024-05-24: 15 mL via OROMUCOSAL
  Filled 2024-05-24: qty 15

## 2024-05-24 MED ORDER — THROMBIN 20000 UNITS EX SOLR
CUTANEOUS | Status: DC | PRN
  Administered 2024-05-24: 20 mL via TOPICAL

## 2024-05-24 MED ORDER — VANCOMYCIN HCL 1000 MG IV SOLR
INTRAVENOUS | Status: AC
  Filled 2024-05-24: qty 40

## 2024-05-24 MED ORDER — SUCCINYLCHOLINE CHLORIDE 200 MG/10ML IV SOSY
PREFILLED_SYRINGE | INTRAVENOUS | Status: DC | PRN
  Administered 2024-05-24: 100 mg via INTRAVENOUS

## 2024-05-24 MED ORDER — PRAVASTATIN SODIUM 10 MG PO TABS
20.0000 mg | ORAL_TABLET | Freq: Every day | ORAL | Status: DC
Start: 2024-05-24 — End: 2024-05-25
  Administered 2024-05-24: 20 mg via ORAL
  Filled 2024-05-24 (×2): qty 2

## 2024-05-24 MED ORDER — ORAL CARE MOUTH RINSE: 15.0000 mL | Freq: Once | OROMUCOSAL | Status: AC

## 2024-05-24 MED ORDER — LIDOCAINE 2% (20 MG/ML) 5 ML SYRINGE
INTRAMUSCULAR | Status: DC | PRN
  Administered 2024-05-24: 60 mg via INTRAVENOUS

## 2024-05-24 MED ORDER — POVIDONE-IODINE 10 % EX SWAB
2.0000 | Freq: Once | CUTANEOUS | Status: AC
  Administered 2024-05-24: 2 via TOPICAL

## 2024-05-24 MED ORDER — FENTANYL CITRATE (PF) 100 MCG/2ML IJ SOLN
25.0000 ug | INTRAMUSCULAR | Status: DC | PRN
  Administered 2024-05-24: 25 ug via INTRAVENOUS
  Administered 2024-05-24: 50 ug via INTRAVENOUS
  Administered 2024-05-24: 25 ug via INTRAVENOUS
  Administered 2024-05-24: 50 ug via INTRAVENOUS

## 2024-05-24 MED ORDER — SENNA 8.6 MG PO TABS
1.0000 | ORAL_TABLET | Freq: Two times a day (BID) | ORAL | Status: DC
  Administered 2024-05-24: 8.6 mg via ORAL
  Filled 2024-05-24: qty 1

## 2024-05-24 MED ORDER — CEFAZOLIN SODIUM-DEXTROSE 2-4 GM/100ML-% IV SOLN
2.0000 g | INTRAVENOUS | Status: AC
  Administered 2024-05-24: 2 g via INTRAVENOUS
  Filled 2024-05-24: qty 100

## 2024-05-24 MED ORDER — FENTANYL CITRATE (PF) 100 MCG/2ML IJ SOLN
INTRAMUSCULAR | Status: AC
  Filled 2024-05-24: qty 2

## 2024-05-24 MED ORDER — HYDROMORPHONE HCL 1 MG/ML IJ SOLN
INTRAMUSCULAR | Status: AC
  Filled 2024-05-24: qty 0.5

## 2024-05-24 MED ORDER — HYDROMORPHONE HCL 1 MG/ML IJ SOLN
1.0000 mg | INTRAMUSCULAR | Status: DC | PRN
  Administered 2024-05-24 – 2024-05-25 (×3): 1 mg via INTRAVENOUS
  Filled 2024-05-24 (×3): qty 1

## 2024-05-24 MED ORDER — OXYCODONE HCL 5 MG/5ML PO SOLN: 5.0000 mg | Freq: Once | ORAL | Status: DC | PRN

## 2024-05-24 MED ORDER — 0.9 % SODIUM CHLORIDE (POUR BTL) OPTIME
TOPICAL | Status: DC | PRN
  Administered 2024-05-24 (×3): 1000 mL

## 2024-05-24 MED ORDER — PHENYLEPHRINE 80 MCG/ML (10ML) SYRINGE FOR IV PUSH (FOR BLOOD PRESSURE SUPPORT)
PREFILLED_SYRINGE | INTRAVENOUS | Status: AC
  Filled 2024-05-24: qty 10

## 2024-05-24 MED ORDER — PHENYLEPHRINE HCL-NACL 20-0.9 MG/250ML-% IV SOLN
INTRAVENOUS | Status: DC | PRN
  Administered 2024-05-24: 40 ug/min via INTRAVENOUS

## 2024-05-24 MED ORDER — ROPINIROLE HCL 1 MG PO TABS
2.0000 mg | ORAL_TABLET | Freq: Every evening | ORAL | Status: DC
  Administered 2024-05-24: 2 mg via ORAL
  Filled 2024-05-24: qty 4
  Filled 2024-05-24: qty 2

## 2024-05-24 MED ORDER — PROPOFOL 10 MG/ML IV BOLUS
INTRAVENOUS | Status: AC
  Filled 2024-05-24: qty 20

## 2024-05-24 MED ORDER — THROMBIN (RECOMBINANT) 20000 UNITS EX SOLR
CUTANEOUS | Status: AC
  Filled 2024-05-24: qty 20000

## 2024-05-24 MED ORDER — PROPOFOL 10 MG/ML IV BOLUS
INTRAVENOUS | Status: DC | PRN
  Administered 2024-05-24: 125 ug/kg/min via INTRAVENOUS
  Administered 2024-05-24: 140 ug/kg/min via INTRAVENOUS

## 2024-05-24 MED ORDER — BUPIVACAINE-EPINEPHRINE (PF) 0.25% -1:200000 IJ SOLN
INTRAMUSCULAR | Status: AC
  Filled 2024-05-24: qty 60

## 2024-05-24 MED ORDER — ACETAMINOPHEN 325 MG PO TABS
650.0000 mg | ORAL_TABLET | Freq: Once | ORAL | Status: AC
  Administered 2024-05-24: 650 mg via ORAL
  Filled 2024-05-24: qty 2

## 2024-05-24 MED ORDER — FENTANYL CITRATE (PF) 100 MCG/2ML IJ SOLN
INTRAMUSCULAR | Status: AC
Start: 2024-05-24 — End: 2024-05-24
  Filled 2024-05-24: qty 2

## 2024-05-24 MED ORDER — PHENYLEPHRINE 80 MCG/ML (10ML) SYRINGE FOR IV PUSH (FOR BLOOD PRESSURE SUPPORT)
PREFILLED_SYRINGE | INTRAVENOUS | Status: DC | PRN
  Administered 2024-05-24: 160 ug via INTRAVENOUS
  Administered 2024-05-24: 80 ug via INTRAVENOUS
  Administered 2024-05-24: 160 ug via INTRAVENOUS

## 2024-05-24 MED ORDER — METHYLPREDNISOLONE ACETATE 80 MG/ML IJ SUSP
INTRAMUSCULAR | Status: AC
Start: 2024-05-24 — End: 2024-05-24
  Filled 2024-05-24: qty 1

## 2024-05-24 MED ORDER — LOSARTAN POTASSIUM 50 MG PO TABS: 100.0000 mg | ORAL_TABLET | Freq: Every day | ORAL | Status: DC

## 2024-05-24 MED ORDER — TRANEXAMIC ACID-NACL 1000-0.7 MG/100ML-% IV SOLN
1000.0000 mg | Freq: Once | INTRAVENOUS | Status: AC
  Administered 2024-05-24: 1000 mg via INTRAVENOUS
  Filled 2024-05-24: qty 100

## 2024-05-24 MED ORDER — ONDANSETRON HCL 4 MG/2ML IJ SOLN: 4.0000 mg | Freq: Once | INTRAMUSCULAR | Status: DC | PRN

## 2024-05-24 MED ORDER — SODIUM CHLORIDE 0.9 % IV SOLN
0.1500 ug/kg/min | INTRAVENOUS | Status: DC
  Administered 2024-05-24: .15 ug/kg/min via INTRAVENOUS
  Filled 2024-05-24 (×4): qty 1000

## 2024-05-24 MED ORDER — ONDANSETRON HCL 4 MG/2ML IJ SOLN: 4.0000 mg | Freq: Four times a day (QID) | INTRAMUSCULAR | Status: DC | PRN

## 2024-05-24 MED ORDER — MIDAZOLAM HCL 2 MG/2ML IJ SOLN
INTRAMUSCULAR | Status: AC
Start: 2024-05-24 — End: 2024-05-24
  Filled 2024-05-24: qty 2

## 2024-05-24 MED ORDER — ONDANSETRON HCL 4 MG/2ML IJ SOLN
INTRAMUSCULAR | Status: AC
  Filled 2024-05-24: qty 2

## 2024-05-24 MED ORDER — OXYCODONE HCL 5 MG PO TABS
10.0000 mg | ORAL_TABLET | ORAL | Status: DC | PRN
  Administered 2024-05-24 – 2024-05-25 (×4): 15 mg via ORAL
  Filled 2024-05-24 (×4): qty 3

## 2024-05-24 MED ORDER — VANCOMYCIN HCL 1000 MG IV SOLR
INTRAVENOUS | Status: DC | PRN
Start: 2024-05-24 — End: 2024-05-24
  Administered 2024-05-24 (×2): 1000 mg via TOPICAL

## 2024-05-24 MED ORDER — BUPIVACAINE-EPINEPHRINE 0.25% -1:200000 IJ SOLN
INTRAMUSCULAR | Status: DC | PRN
  Administered 2024-05-24 (×2): 30 mL

## 2024-05-24 MED ORDER — AMLODIPINE BESYLATE 5 MG PO TABS
5.0000 mg | ORAL_TABLET | Freq: Every day | ORAL | Status: DC
  Administered 2024-05-25: 5 mg via ORAL
  Filled 2024-05-24: qty 1

## 2024-05-24 MED ORDER — ONDANSETRON HCL 4 MG PO TABS: 4.0000 mg | ORAL_TABLET | Freq: Four times a day (QID) | ORAL | Status: DC | PRN

## 2024-05-24 MED ORDER — OXYCODONE HCL 5 MG PO TABS: 5.0000 mg | ORAL_TABLET | Freq: Once | ORAL | Status: DC | PRN

## 2024-05-24 MED ORDER — FENTANYL CITRATE (PF) 250 MCG/5ML IJ SOLN
INTRAMUSCULAR | Status: AC
  Filled 2024-05-24: qty 5

## 2024-05-24 MED ORDER — ONDANSETRON HCL 4 MG/2ML IJ SOLN
INTRAMUSCULAR | Status: DC | PRN
  Administered 2024-05-24: 4 mg via INTRAVENOUS

## 2024-05-24 MED ORDER — CEFAZOLIN SODIUM-DEXTROSE 2-4 GM/100ML-% IV SOLN
2.0000 g | Freq: Four times a day (QID) | INTRAVENOUS | Status: AC
  Administered 2024-05-24 – 2024-05-25 (×2): 2 g via INTRAVENOUS
  Filled 2024-05-24 (×2): qty 100

## 2024-05-24 SURGICAL SUPPLY — 87 items
ALCOHOL 70% 16 OZ (MISCELLANEOUS) ×1 IMPLANT
BAG COUNTER SPONGE SURGICOUNT (BAG) ×1 IMPLANT
BENZOIN TINCTURE PRP APPL 2/3 (GAUZE/BANDAGES/DRESSINGS) ×1 IMPLANT
BUR 14 MATCH 3 (BUR) IMPLANT
BUR MR8 14CM BALL SYMTRI 5 (BUR) IMPLANT
BUR NEURO DRILL SOFT 3.0X3.8M (BURR) ×1 IMPLANT
CANISTER SUCTION 3000ML PPV (SUCTIONS) ×1 IMPLANT
CATH FOLEY 2WAY SLVR 5CC 16FR (CATHETERS) ×1 IMPLANT
CLIP NEUROVISION LG (NEUROSURGERY SUPPLIES) IMPLANT
CLSR STERI-STRIP ANTIMIC 1/2X4 (GAUZE/BANDAGES/DRESSINGS) ×1 IMPLANT
CORD BIPOLAR FORCEPS 12FT (ELECTRODE) IMPLANT
COVER SURGICAL LIGHT HANDLE (MISCELLANEOUS) ×2 IMPLANT
COVERAGE SUPPORT O-ARM STEALTH (MISCELLANEOUS) ×1 IMPLANT
DERMABOND ADVANCED .7 DNX12 (GAUZE/BANDAGES/DRESSINGS) ×2 IMPLANT
DERMABOND ADVANCED .7 DNX6 (GAUZE/BANDAGES/DRESSINGS) IMPLANT
DISSECTOR BLUNT TIP ENDO 5MM (MISCELLANEOUS) ×1 IMPLANT
DISSECTOR STICK (MISCELLANEOUS) IMPLANT
DRAPE C-ARM 42X72 X-RAY (DRAPES) ×2 IMPLANT
DRAPE C-ARMOR (DRAPES) ×1 IMPLANT
DRAPE INCISE IOBAN 66X45 STRL (DRAPES) ×1 IMPLANT
DRAPE MICROSCOPE LEICA 54X105 (DRAPES) ×1 IMPLANT
DRAPE SHEET LG 3/4 BI-LAMINATE (DRAPES) ×6 IMPLANT
DRAPE UTILITY XL STRL (DRAPES) ×4 IMPLANT
DRESSING MEPILEX FLEX 4X4 (GAUZE/BANDAGES/DRESSINGS) ×4 IMPLANT
DRSG COVADERM 4X10 (GAUZE/BANDAGES/DRESSINGS) ×2 IMPLANT
DRSG MEPILEX POST OP 4X8 (GAUZE/BANDAGES/DRESSINGS) IMPLANT
DRSG TEGADERM 4X10 (GAUZE/BANDAGES/DRESSINGS) ×1 IMPLANT
DRSG TEGADERM 4X4.5 CHG (GAUZE/BANDAGES/DRESSINGS) IMPLANT
DRSG TEGADERM 4X4.75 (GAUZE/BANDAGES/DRESSINGS) ×4 IMPLANT
DURAPREP 26ML APPLICATOR (WOUND CARE) ×2 IMPLANT
ELECT COATED BLADE 2.86 ST (ELECTRODE) ×2 IMPLANT
ELECT NVM5 SURFACE MEP/EMG (ELECTRODE) IMPLANT
ELECT PENCIL ROCKER SW 15FT (MISCELLANEOUS) ×2 IMPLANT
ELECTRODE REM PT RTRN 9FT ADLT (ELECTROSURGICAL) ×2 IMPLANT
FEE COVERAGE SUPPORT O-ARM (MISCELLANEOUS) ×1 IMPLANT
GAUZE SPONGE 4X4 12PLY STRL (GAUZE/BANDAGES/DRESSINGS) ×1 IMPLANT
GAUZE SPONGE 4X4 12PLY STRL LF (GAUZE/BANDAGES/DRESSINGS) IMPLANT
GLOVE BIO SURGEON STRL SZ7.5 (GLOVE) ×3 IMPLANT
GLOVE BIOGEL PI IND STRL 7.0 (GLOVE) ×1 IMPLANT
GLOVE BIOGEL PI IND STRL 7.5 (GLOVE) ×1 IMPLANT
GLOVE ECLIPSE 7.5 STRL STRAW (GLOVE) IMPLANT
GLOVE INDICATOR 7.5 STRL GRN (GLOVE) ×1 IMPLANT
GLOVE INDICATOR 8.0 STRL GRN (GLOVE) ×1 IMPLANT
GLOVE SS BIOGEL STRL SZ 7.5 (GLOVE) ×2 IMPLANT
GOWN STRL REUS W/ TWL LRG LVL3 (GOWN DISPOSABLE) ×1 IMPLANT
GOWN STRL REUS W/ TWL XL LVL3 (GOWN DISPOSABLE) ×1 IMPLANT
GOWN STRL SURGICAL XL XLNG (GOWN DISPOSABLE) ×2 IMPLANT
GRAFT BNE FBR PLIAFX PRIME 10 (Bone Implant) IMPLANT
KIT BASIN OR (CUSTOM PROCEDURE TRAY) ×2 IMPLANT
KIT DILATOR XLIF 5 (KITS) IMPLANT
KIT POSITIONER JACKSON TABLE (MISCELLANEOUS) ×1 IMPLANT
KIT SURGICAL ACCESS MAXCESS 4 (KITS) IMPLANT
KIT TURNOVER KIT B (KITS) ×2 IMPLANT
MARKER SKIN DUAL TIP RULER LAB (MISCELLANEOUS) ×1 IMPLANT
MARKER SPHERE PSV REFLC NDI (MISCELLANEOUS) ×5 IMPLANT
MODULE EMG NDL SSEP NVM5 (NEUROSURGERY SUPPLIES) IMPLANT
MODULE EMG NEEDLE SSEP NVM5 (NEUROSURGERY SUPPLIES) ×1 IMPLANT
NDL 22X1.5 STRL (OR ONLY) (MISCELLANEOUS) ×1 IMPLANT
NEEDLE 22X1.5 STRL (OR ONLY) (MISCELLANEOUS) ×1 IMPLANT
NS IRRIG 1000ML POUR BTL (IV SOLUTION) ×2 IMPLANT
PACK LAMINECTOMY ORTHO (CUSTOM PROCEDURE TRAY) ×2 IMPLANT
PACK UNIVERSAL I (CUSTOM PROCEDURE TRAY) ×1 IMPLANT
PAD ARMBOARD POSITIONER FOAM (MISCELLANEOUS) ×2 IMPLANT
PATTIES SURGICAL .5 X.5 (GAUZE/BANDAGES/DRESSINGS) IMPLANT
PROBE BALL TIP NVM5 SNG USE (NEUROSURGERY SUPPLIES) IMPLANT
PUTTY DBM INSTAFILL CART 5CC (Putty) IMPLANT
SCREW LOCK RELINE 5.5 TULIP (Screw) IMPLANT
SCREW RELINE MAS RED 7.5X50MM (Screw) IMPLANT
SPACER RISEL 18X55 10-17 10D (Spacer) IMPLANT
SPACER RISEL 18X60 10-17MM10D (Spacer) IMPLANT
SPONGE SURGIFOAM ABS GEL 100 (HEMOSTASIS) ×1 IMPLANT
SPONGE T-LAP 4X18 ~~LOC~~+RFID (SPONGE) ×3 IMPLANT
SUCTION TUBE FRAZIER 10FR DISP (SUCTIONS) ×1 IMPLANT
SUT BONE WAX W31G (SUTURE) ×1 IMPLANT
SUT MNCRL AB 3-0 PS2 27 (SUTURE) ×2 IMPLANT
SUT VIC AB 0 CT1 18XCR BRD8 (SUTURE) ×3 IMPLANT
SUT VIC AB 2-0 CT1 18 (SUTURE) ×3 IMPLANT
SUT VIC AB 2-0 CT2 18 VCP726D (SUTURE) ×2 IMPLANT
SUT VIC AB 3-0 PS2 18XBRD (SUTURE) ×1 IMPLANT
SYR BULB IRRIG 60ML STRL (SYRINGE) ×1 IMPLANT
SYR CONTROL 10ML LL (SYRINGE) ×1 IMPLANT
TAPE CLOTH 4X10 WHT NS (GAUZE/BANDAGES/DRESSINGS) ×2 IMPLANT
TIP CONICAL INSTAFILL (ORTHOPEDIC DISPOSABLE SUPPLIES) IMPLANT
TOWEL GREEN STERILE (TOWEL DISPOSABLE) ×2 IMPLANT
TOWEL GREEN STERILE FF (TOWEL DISPOSABLE) ×2 IMPLANT
TUBING FEATHERFLOW (TUBING) ×1 IMPLANT
WATER STERILE IRR 1000ML POUR (IV SOLUTION) ×2 IMPLANT

## 2024-05-24 NOTE — Anesthesia Procedure Notes (Signed)
 Arterial Line Insertion Start/End7/29/2025 8:00 AM, 05/24/2024 8:05 AM Performed by: Zelphia Norleen HERO, CRNA, CRNA  Patient location: Pre-op. Preanesthetic checklist: patient identified, IV checked, site marked, risks and benefits discussed, surgical consent, monitors and equipment checked, pre-op evaluation, timeout performed and anesthesia consent Lidocaine  1% used for infiltration Left, radial was placed Catheter size: 20 G Hand hygiene performed  and maximum sterile barriers used   Attempts: 1 Procedure performed using ultrasound guided technique. Ultrasound Notes:anatomy identified, needle tip was noted to be adjacent to the nerve/plexus identified and no ultrasound evidence of intravascular and/or intraneural injection Following insertion, dressing applied and Biopatch. Post procedure assessment: normal and unchanged  Patient tolerated the procedure well with no immediate complications.

## 2024-05-24 NOTE — Anesthesia Procedure Notes (Signed)
 Procedure Name: Intubation Date/Time: 05/24/2024 11:29 AM  Performed by: Alen Motto D, CRNAPre-anesthesia Checklist: Patient identified, Emergency Drugs available, Suction available and Patient being monitored Patient Re-evaluated:Patient Re-evaluated prior to induction Oxygen Delivery Method: Circle System Utilized Preoxygenation: Pre-oxygenation with 100% oxygen Induction Type: IV induction Ventilation: Mask ventilation without difficulty Laryngoscope Size: Mac and 4 Grade View: Grade I Tube type: Oral Tube size: 7.5 mm Number of attempts: 1 Airway Equipment and Method: Stylet and Oral airway Placement Confirmation: ETT inserted through vocal cords under direct vision, positive ETCO2 and breath sounds checked- equal and bilateral Secured at: 22 cm Tube secured with: Tape Dental Injury: Teeth and Oropharynx as per pre-operative assessment

## 2024-05-24 NOTE — Op Note (Addendum)
 Orthopedic Spine Surgery Operative Report  Procedure: L3/4 and L4/5 posterior spinal fusion L3, L4, L5 posterior segmental instrumentation with rods Application of demineralized bone matrix Intra-operative use of stereotactic computer-assisted procedure (navigation)   Modifier: none  Date of procedure: 05/24/2024  Patient name: Randall [name] MRN: [id number] DOB: 07-02-1946  Surgeon: Ozell Ada, MD Assistant: none Pre-operative diagnosis: lumbar radiculopathy Post-operative diagnosis: same as above Findings: well placed interbody devices on the intraoperative CT scan  Specimens: none Anesthesia: general EBL: 70cc Complications: none Ancef  and TXA had been given prior to the lateral interbody portion of the case and were not due to be re-dosed  Implants:  Nuvasive Reline Screws 7.5x60mm screws (x6) 70mm titanium 5.71mm diameter rods (x2)   Indication for procedure: Patient is a 78 y.o. male who presented to the office with symptoms consistent with lumbar radiculopathy. L3/4 and L4/5 lateral interbody fusion was recommended to get indirect decompression. As a part of the procedure, L3/4 and L4/5 posterior instrumented spinal fusion was discussed. The risks, benefits, and alternatives of surgery were covered with the patient. After this conversation, patient elected to proceed with surgery.   Procedure Description: At this point, the patient was in the operating room in the supine position on a stretcher.  His lumbar spine had been previously marked in the preoperative holding area.  His identity and consent were verified there as well.  The pro axis table was converted to a prone [place] table.  The patient was already intubated from the first procedure and neuromonitoring leads had already been attached.  He was then transferred to the prone [place] table in the prone position.  All bony prominences were well-padded.  The head of the bed was slightly elevated and the eyes were free  from compression.  The surgical area over the lumbar spine was cleansed with alcohol .  The patient's skin was then prepped and draped in a standard, sterile fashion.  A timeout was performed that identified the patient, the procedure, and the operative levels.  All team members agreed with what was stated in the timeout.  A stab incision was then made over the left PSIS.  It was taken sharply down through the skin and dermis.  A percutaneous pin was then placed along the PSIS and malleted into place.  A fiducial array was then attached to the percutaneous pin.  The surgical area was then covered with 2 three-quarter drapes.  And the reference array was covered with a green towel.  The O-arm was brought in and an intraoperative CT scan was obtained.  The screws were then inserted under navigational guidance.  First, the navigational probe was used to find the skin overlying the pedicle.  A stab incision was then made and taken sharply down through skin, dermis, and fascia.  A navigated bur was then placed at the pedicle start point with the navigational guidance.  The bur was advanced into the pedicle.  A 6.5 mm awl tap was then placed into the hole that had been burred.  The awl tap was then advanced under navigational guidance into the pedicle and vertebral body.  The navigational probe was then used to palpate the pedicle walls and no breaches were felt.  A 7.5 x 50 mm screw was then inserted under navigational guidance.  This process was repeated for all the screws.  The screws inserted at each level are listed below.   Left  Right L3 7.5x43mm 7.5x39mm L4 7.5x46mm 7.5x67mm L5 7.5x64mm 7.5x44mm  The navigational  bur was then inserted into the previously made stab incision for the L5 screw and was then placed along the L4/5 facet joint.  It was used to then decorticated and bur the facet joint.  The navigational bur was then moved to the L4 screw and was placed along the L3/4 facet joint.  It was then  used to decorticate and bur the facet joint.  The same process was then used to decorticate and bur the L3/4 and L4/5 facet joints on the contralateral side.  Demineralized bone matrix was then inserted through the wounds into the area of the L3/4 and L4/5 facet joints bilaterally.  AP and lateral fluoroscopic images confirm satisfactory position of the posterior spinal instrumentation.  The screws were then stimulated and all screws stimulated over 40.  A caliper was then used to estimate the rod length.  It measured 55 mm so 70 mm rods were selected.  I first started with the left side and inserted the 70 mm rod through all 3 screws on that side.  Set caps were then tightened over the rod to hold it in place.  I then switched to the right side and inserted the 70 mm rod through all 3 screws on that side.  Set caps were then tightened over the rod to hold it in place.  AP fluoroscopic image was obtained that showed the rod through all the screws and that there was adequate length on both sides of the screws.  The set caps were final tightened into place.  The rod holders were then detached from the rods.  The percutaneous pin was removed.  Final AP and lateral fluoroscopic images showed satisfactory position of the interbody devices at L3/4 and L4/5 from the other portion of the procedure.  It also showed satisfactory position of the posterior spinal instrumentation at L3, L4, and L5 with rod spanning all the screws on both sides.  A final neuromonitoring check was performed and there was no change from baseline.  Neuromonitoring was discontinued at this time.  0.25% Marcaine  was then injected into the soft tissues.  A total of 30 cc was used. The fascia was reapproximated with 0 vicryl suture. The deep dermal layer was reapproximated with 2-0 vicryl. The skin as closed with a 3-0 running monocryl. All counts were correct at the end of the case.  Dermabond was applied over the skin.  An island dressing was  placed on each side over the stab incisions.  The patient was transferred back to a bed and brought to the post-anesthesia care unit by anesthesia staff in stable condition.  Post-operative plan: The patient will recover in the post-anesthesia care unit and then go to the floor. The patient will receive two post-operative doses of ancef  and another dose of TXA. The patient will be out of bed as tolerated with no brace. The patient will work with physical therapy. The patient's disposition will likely discharge to home in the next 2-3 days.    Ozell Ada, MD Orthopedic Surgeon

## 2024-05-24 NOTE — Transfer of Care (Signed)
 Immediate Anesthesia Transfer of Care Note  Patient: Randall [name]  Procedure(s) Performed: ANTERIOR LATERAL LUMBAR FUSION 2 LEVEL POSTERIOR LUMBAR FUSION 2 LEVEL APPLICATION OF O-ARM  Patient Location: PACU  Anesthesia Type:General  Level of Consciousness: drowsy, patient cooperative, and responds to stimulation  Airway & Oxygen Therapy: Patient Spontanous Breathing and Patient connected to face mask oxygen  Post-op Assessment: Report given to RN, Post -op Vital signs reviewed and stable, Patient moving all extremities X 4, and Patient able to stick tongue midline  Post vital signs: Reviewed and stable  Last Vitals:  Vitals Value Taken Time  BP 105/58 05/24/24 17:15  Temp 98.6   Pulse 85 05/24/24 17:18  Resp 11 05/24/24 17:18  SpO2 98 % 05/24/24 17:18  Vitals shown include unfiled device data.  Last Pain:  Vitals:   05/24/24 0745  PainSc: 3          Complications: No notable events documented.

## 2024-05-24 NOTE — Progress Notes (Signed)
 Orthopedic Surgery Post-operative Progress Note  Assessment: Patient is a 78 y.o. male who is currently admitted after undergoing L3-5 XLIF and PSIF   Plan: -Operative plans complete -Needs upright films when able -Drains: none -Out of bed as tolerated, no brace -No bending/lifting/twisting greater than 10 pounds -OT evaluate and treat -Pain control -Regular diet -No chemoprophylaxis for dvt or antiplatelets for 72 hours after surgery -Ancef  x2 post-operative doses -Disposition: to floor from PACU  ___________________________________________________________________________   Subjective: No acute events since surgery. Recovering in PACU. Pain controlled.   Objective:  General: no acute distress, appropriate affect Neurologic: drowsy, following commands Respiratory: unlabored breathing on nasal canula Skin: dressings clear/dry/intact  MSK (spine):  -Strength exam      Right  Left  EHL    5/5  5/5 TA    5/5  5/5 GSC    5/5  5/5 Knee extension  5/5  5/5 Hip flexion   3/5  3/5  Hip flexion strength testing limited by pain  -Sensory exam    Sensation intact to light touch in L3-S1 nerve distributions of bilateral lower extremities   Patient name: Randall [name] Patient MRN: [id number] Date: 05/24/24

## 2024-05-24 NOTE — Anesthesia Postprocedure Evaluation (Signed)
 Anesthesia Post Note  Patient: Randall [name]  Procedure(s) Performed: ANTERIOR LATERAL LUMBAR FUSION 2 LEVEL POSTERIOR LUMBAR FUSION 2 LEVEL APPLICATION OF O-ARM     Patient location during evaluation: PACU Anesthesia Type: General Level of consciousness: awake and alert Pain management: pain level controlled Vital Signs Assessment: post-procedure vital signs reviewed and stable Respiratory status: spontaneous breathing, nonlabored ventilation, respiratory function stable and patient connected to nasal cannula oxygen Cardiovascular status: blood pressure returned to baseline and stable Postop Assessment: no apparent nausea or vomiting Anesthetic complications: no   No notable events documented.  Last Vitals:  Vitals:   05/24/24 1730 05/24/24 1745  BP: 125/69 134/77  Pulse: 81 88  Resp: 10 14  Temp:    SpO2: 97% 91%    Last Pain:  Vitals:   05/24/24 1715  PainSc: Asleep    LLE Motor Response: Purposeful movement (05/24/24 1745)   RLE Motor Response: Purposeful movement (05/24/24 1745)        Thom JONELLE Peoples

## 2024-05-24 NOTE — Op Note (Addendum)
 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 male who presented to the office with symptoms consistent with lumbar radiculopathy. The patient had tried conservative treatments that did not provide any lasting relief. As result, operative management was discussed. 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 In order to address the symptoms, lateral lumbar interbody arthrodesis at L3/4 and L4/5 was discussed as a part of one of the treatment options. The risks including but not limited to dural tear, nerve root injury, paralysis, pseudarthrosis, persistent pain, infection, bleeding, hardware failure, adjacent segment disease, vascular injury, psoas weakness, lumbar plexus injury, heart attack, death, fracture, and need for additional procedures were discussed with the patient. Since this will be an indirect decompression, there is a risk of persistent symptoms which the patient understood after explaining the reason. The benefit of the surgery would be relief of the patient's radiating leg pain. The alternatives to surgical management were covered with the patient and included continued  monitoring, physical therapy, over-the-counter pain medications, ambulatory aids, injections, and activity modification. All the patient's questions were answered to his satisfaction. After this discussion, the patient expressed understanding and elected to proceed with surgical intervention.  Procedure Description: The patient was met in the pre-operative holding area. The patient's identity and consent were verified. The operative site was marked. The patient's remaining questions about the surgery were answered. The patient was brought back to the operating room. General anesthesia was induced and an endotracheal tube was placed by the anesthesia staff. Neuromonitoring leads were attached to the patient by the technologist. The patient was transferred to the pro-axis table in the lateral position. An axillary roll was placed under the down arm. All bony prominences were well padded. The patient was secured in place with tape. The bed was bent in the middle to drop the iliac crest. The surgical area was cleansed with alcohol . Baseline neuromonitoring signals were obtained. Fluoroscopy was then brought in to check rotation on the AP image and to mark the anterior, posterior 1/3, and posterior aspects of the L3/4 and L4/5 disc space on the lateral image. The patient's skin was then prepped and draped in a standard, sterile fashion. A time out was performed that identified the patient, the procedure, and the operative levels. All team members agreed with what was stated in the time out.  An oblique incision over the abdominal wall was made that connected the two previously marked discs. Skin incision was carried sharply down through the skin and dermis. Electrocautery was used to continue the dissection through the subcutaneous tissue and fat to the level of the fascia overlying the external oblique muscle. Electrocautery was used to make an incision through the fascia in line with the muscle fibers. Metzenbaum  scissors were used to dissect in line with the muscle fibers  of the external oblique, the internal oblique, and then the transversus abdominis muscles. Metzenbaum scissors were used to open the transversalis fascia. A sponge stick and long kidner were utilized to sweep the retroperitoneal fat ventral to eventually visualize the psoas muscle.   The initial dilator was placed on the psoas fibers. An AP and lateral fluoroscopic image was taken to confirm dilator was at L4/5 and in proper position. The dilator was stimulated at that level and all readings were greater than 20. The dilator was then rotated to bluntly advance it through the psoas muscle. The position of the dilator was checked and adjusted under fluoroscopy until it was in the proper position near the posterior 1/3 of the disc space. The dilator was again stimulated and all readings were greater than 20. A K wire was advanced through the dilator into the disc space to secure the dilator's position. Sequential dilators were placed over the initial dilator and rotated to bluntly dissect to the level of the disc space. Each time a new dilator was placed, it was stimulated in all directions. The lowest reading was 15 with the largest dilator when aimed posteriorly.  The retractor was then advanced over the dilators to the level of the disc space. Fluoroscopy was used to adjust and align the retractor to be parallel to the disc space. The retractor was locked into place using the bed attachment. The position of the retractor was checked again on AP and lateral fluoroscopy. The retractor was in a satisfactory position. The stimulator probe was used to test in all directors, including under the posterior retractor blade and the lowest reading was 11 posteriorly. The shim was placed and advanced down the retractor into the disc space. The dilators and K wire were removed. The cranial/caudal aspect of the retractor was opened and the anterior blade of the  retractor was advanced ventrally by six clicks. A bipolar and long kidner were used to remove the remaining soft tissue off of the disc space. A rectangular annulotomy was made in the disc space. A pituitary was used to remove some of the disc. A Cobb was placed into the disc space and advanced along the endplate to clear cartilaginous and disc material off of the endplate under fluoroscopic guidance. The Cobb was impacted to advance it through the annulus on the contralateral side. The Cobb was rotated and the other end plate was prepared in the same fashion. More disc was removed with a pituitary. Then, a combination of ringed curettes, straight curettes, curved curettes, and pituitaries were used to remove the remaining disc until the endplates were well prepared with cartilaginous and disc material removed off of them. A series of trials were advanced across the disc space under AP fluoroscopic guidance until a good fit was obtained. This occurred with a 10mm trial. Neuromonitoring signals were checked and were unchanged from baseline. This size expandable biomechanical interbody was selected for implantation (see implant section above for specifics). Demineralized bone matrix was packed into interbody device on the back table. The biomechanical interbody device was placed into the disc space and advanced under fluoroscopic guidance until it was across the disc space. The cage was then expanded on the AP view. It was expanded to 13mm. The cage was then back filled with demineralized bone matrix. Neuromonitoring signals were checked and were unchanged from baseline. AP and lateral fluoroscopic images were obtained which showed proper placement of the interbody device at L4/5. The retractor blades were then closed. The retractor  was then slowly removed and any bleeding was coagulated with a bipolar.   The initial dilator was then replaced on the psoas fibers more cranially near the L3/4 disc space. An AP and  lateral fluoroscopic image was taken to confirm dilator was at L3/4 and in proper position. The dilator was stimulated at that level and all readings were greater than 20. The dilator was then rotated to bluntly advance it through the psoas muscle. The position of the dilator was checked and adjusted under fluoroscopy until it was in the proper position near the posterior 1/3 of the disc space. The dilator was again stimulated and all readings were greater than 20. A K wire was advanced through the dilator into the disc space to secure the dilator's position. Sequential dilators were placed over the initial dilator and rotated to bluntly dissect to the level of the disc space. Each time a new dilator was placed, it was stimulated in all directions. No reading was under 20. The retractor was then advanced over the dilators to the level of the disc space. Fluoroscopy was used to adjust and align the retractor to be parallel to the disc space. The retractor was locked into place using the bed attachment. The position of the retractor was checked again on AP and lateral fluoroscopy. The retractor was in a satisfactory position. The stimulator probe was used to test in all directors, including under the posterior retractor blade and the lowest reading was 20 posteriorly. The shim was placed and advanced down the retractor into the disc space. The dilators and K wire were removed. The cranial/caudal aspect of the retractor was opened and the anterior blade of the retractor was advanced ventrally by six clicks. A bipolar and long kidner were used to remove the remaining soft tissue off of the disc space. A rectangular annulotomy was made in the disc space. A pituitary was used to remove some of the disc. A Cobb was placed into the disc space and advanced along the endplate to clear cartilaginous and disc material off of the endplate under fluoroscopic guidance. The Cobb was impacted to advance it through the annulus on the  contralateral side. The Cobb was rotated and the other end plate was prepared in the same fashion. More disc was removed with a pituitary. Then, a combination of ringed curettes, straight curettes, curved curettes, and pituitaries were used to remove the remaining disc until the endplates were well prepared with cartilaginous and disc material removed off of them. A series of trials were advanced across the disc space under AP fluoroscopic guidance until a good fit was obtained. This occurred with a 10mm trial. Neuromonitoring signals were checked and were unchanged from baseline. This size expandable biomechanical interbody was selected for implantation (see implant section above for specifics). Demineralized bone matrix was packed into interbody device on the back table. The biomechanical interbody device was placed into the disc space and advanced under fluoroscopic guidance until it was across the disc space. The cage was then expanded on the AP view. It was expanded to 12mm. The cage was then back filled with demineralized bone matrix. Neuromonitoring signals were checked and were unchanged from baseline. AP and lateral fluoroscopic images were obtained which showed proper placement of the interbody device. The retractor blades were then closed. The retractor was then slowly removed and any bleeding was coagulated with a bipolar. AP and lateral fluoroscopic images were taken and confirmed satisfactory position of both implants.  The transversalis fascia was  reapproximated with 0 vicryl. The internal and external oblique muscular layers were reapproximated with 0 vicryl. The fascia overlying the external oblique muscle was reapproximated with 0 vicryl.  30 cc of 0.25% Marcaine  with epinephrine  was injected into the soft tissues. The deep dermal layer was reapproximated with 2-0 vicryl. The skin was closed with 3-0 monocryl. The incision was dressed with dermabond and an island dressing.   Post-operative  plan: The patient remained intubated and in the operating room after this procedure as there was plan for further posterior surgery. The drapes were taken down and the patient was transferred to a stretched to convert the pro-axis bed to a prone jackson. The patient remained intubated. The posterior portion of the case was completed in the same trip to the operating room and will be dictated separately.    Ozell Ada, MD Orthopedic Surgeon

## 2024-05-24 NOTE — Brief Op Note (Signed)
 05/24/2024  5:41 PM  PATIENT:  Randall [name]  78 y.o. male  PRE-OPERATIVE DIAGNOSIS:  LUMBAR RADICULOPATHY  POST-OPERATIVE DIAGNOSIS:  LUMBAR RADICULOPATHY  PROCEDURE:  Procedure(s) with comments: ANTERIOR LATERAL LUMBAR FUSION 2 LEVEL (N/A) POSTERIOR LUMBAR FUSION 2 LEVEL (N/A) - L3-4, L4-5 EXTREME LATERAL INTERBODY FUSION AND POSTERIOR INSTRUMENTED FUSION APPLICATION OF O-ARM (N/A)  SURGEON:  Surgeons and Role:    * Georgina Ozell LABOR, MD - Primary  PHYSICIAN ASSISTANT: none  ASSISTANTS: none   ANESTHESIA:   general  EBL:  100cc  BLOOD ADMINISTERED:none  DRAINS: none   LOCAL MEDICATIONS USED:  MARCAINE      SPECIMEN:  No Specimen  DISPOSITION OF SPECIMEN:  N/A  COUNTS:  YES  TOURNIQUET: NONE  DICTATION: .Note written in EPIC  PLAN OF CARE: Admit to inpatient   PATIENT DISPOSITION:  PACU - hemodynamically stable.   Delay start of Pharmacological VTE agent (>24hrs) due to surgical blood loss or risk of bleeding: yes

## 2024-05-25 ENCOUNTER — Other Ambulatory Visit: Payer: Self-pay

## 2024-05-25 ENCOUNTER — Other Ambulatory Visit (HOSPITAL_COMMUNITY): Payer: Self-pay

## 2024-05-25 ENCOUNTER — Encounter (HOSPITAL_COMMUNITY): Payer: Self-pay | Admitting: Orthopedic Surgery

## 2024-05-25 ENCOUNTER — Inpatient Hospital Stay (HOSPITAL_COMMUNITY)

## 2024-05-25 DIAGNOSIS — M5116 Intervertebral disc disorders with radiculopathy, lumbar region: Secondary | ICD-10-CM | POA: Diagnosis not present

## 2024-05-25 LAB — BASIC METABOLIC PANEL WITH GFR
Anion gap: 7 (ref 5–15)
BUN: 22 mg/dL (ref 8–23)
CO2: 26 mmol/L (ref 22–32)
Calcium: 9.1 mg/dL (ref 8.9–10.3)
Chloride: 105 mmol/L (ref 98–111)
Creatinine, Ser: 1.22 mg/dL (ref 0.61–1.24)
GFR, Estimated: >60 mL/min (ref >60)
Glucose, Bld: 112 mg/dL — ABNORMAL HIGH (ref 70–99)
Potassium: 4.4 mmol/L (ref 3.5–5.1)
Sodium: 138 mmol/L (ref 135–145)

## 2024-05-25 LAB — CBC
HCT: 41.3 % (ref 39.0–52.0)
Hemoglobin: 13.9 g/dL (ref 13.0–17.0)
MCH: 32.6 pg (ref 26.0–34.0)
MCHC: 33.7 g/dL (ref 30.0–36.0)
MCV: 96.9 fL (ref 80.0–100.0)
Platelets: 230 K/uL (ref 150–400)
RBC: 4.26 MIL/uL (ref 4.22–5.81)
RDW: 13.1 % (ref 11.5–15.5)
WBC: 11.6 K/uL — ABNORMAL HIGH (ref 4.0–10.5)
nRBC: 0 % (ref 0.0–0.2)

## 2024-05-25 MED ORDER — POLYETHYLENE GLYCOL 3350 17 GM/SCOOP PO POWD
17.0000 g | Freq: Every day | ORAL | 0 refills | Status: AC
  Filled 2024-05-25: qty 238, 14d supply, fill #0

## 2024-05-25 MED ORDER — TIZANIDINE HCL 4 MG PO TABS
4.0000 mg | ORAL_TABLET | Freq: Three times a day (TID) | ORAL | 0 refills | Status: AC
  Filled 2024-05-25: qty 30, 10d supply, fill #0

## 2024-05-25 MED ORDER — ACETAMINOPHEN 500 MG PO TABS
1000.0000 mg | ORAL_TABLET | Freq: Three times a day (TID) | ORAL | 0 refills | Status: DC
  Filled 2024-05-25: qty 84, 14d supply, fill #0

## 2024-05-25 MED ORDER — OXYCODONE HCL 5 MG PO TABS
5.0000 mg | ORAL_TABLET | ORAL | 0 refills | Status: AC | PRN
  Filled 2024-05-25: qty 40, 7d supply, fill #0

## 2024-05-25 MED ORDER — SENNA 8.6 MG PO TABS
1.0000 | ORAL_TABLET | Freq: Two times a day (BID) | ORAL | 0 refills | Status: AC
  Filled 2024-05-25: qty 28, 14d supply, fill #0

## 2024-05-25 MED FILL — Thrombin (Recombinant) For Soln 20000 Unit: CUTANEOUS | Qty: 1 | Status: AC

## 2024-05-25 NOTE — Progress Notes (Signed)
 Orthopedic Surgery Post-operative Progress Note  Assessment: Patient is a 78 y.o. male who is currently admitted after undergoing L3-5 XLIF and PSIF   Plan: -Operative plans complete -Needs upright films when able -Drains: none -Out of bed as tolerated, no brace -No bending/lifting/twisting greater than 10 pounds -OT evaluate and treat -Pain control -Regular diet -No chemoprophylaxis for dvt or antiplatelets for 72 hours after surgery -Ancef  x2 post-operative doses -Anticipate discharge to home today  ___________________________________________________________________________   Subjective: No acute events since surgery. Pain well controlled. Not having any radiating leg pain. His most painful area is near the left lateral abdominal wall. Has already been up and ambulating. Up in chair this morning. Hoping to go home today.   Objective:  General: no acute distress, appropriate affect Neurologic: drowsy, following commands Respiratory: unlabored breathing on nasal canula Skin: dressings clear/dry/intact  MSK (spine):  -Strength exam      Right  Left  EHL    5/5  5/5 TA    5/5  5/5 GSC    5/5  5/5 Knee extension  5/5  5/5 Hip flexion   5/5  4/5  -Sensory exam    Sensation intact to light touch in L3-S1 nerve distributions of bilateral lower extremities   Patient name: Randall [name] Patient MRN: [id number] Date: 05/25/24

## 2024-05-25 NOTE — Evaluation (Signed)
 Occupational Therapy Evaluation and Discharge Patient Details Name: Randall [name] MRN: [id number] DOB: Apr 26, 1946 Today's Date: 05/25/2024   History of Present Illness   Pt is a 78 yo male who presented to [facility] on 7/29 for scheduled L3-5 lateral interbody fusion and posterior instrumented spinal fusion completed 7/29 due to lumbar radiculopathy. PMH: HLD, HTN, IBS, COPD, chronic pain, hx of L3-5 decompression, hx of C4-7 anterior cervical fusion; hx of C4/5 and C6/7 posterior cervical fusion     Clinical Impressions At baseline, pt is Ind with ADLs, Mod I with functional mobility with a RW, and drives. Pt now presents near baseline PLOF but with pain affecting functional level. P currently demonstrates ability to complete ADLs Independent to Contact guard assist and functional mobility/transfers with a RW with Supervision for safety. OT provided back precaution handout and reviewed with pt with pt demonstrating good understanding of and adherence to back precautions during functional tasks. Pt reports his wife can provide needed assistance at current functional level. All assessment and education complete. No further benefit from acute skilled OT services, and no post acute skilled OT needs are anticipated. OT is signing off at this time.      If plan is discharge home, recommend the following:   A little help with walking and/or transfers;A little help with bathing/dressing/bathroom;Assist for transportation;Help with stairs or ramp for entrance     Functional Status Assessment   Patient has had a recent decline in their functional status and demonstrates the ability to make significant improvements in function in a reasonable and predictable amount of time.     Equipment Recommendations   None recommended by OT (Pt already has needed equipment)     Recommendations for Other Services         Precautions/Restrictions   Precautions Precautions: Back;Fall Precaution Booklet  Issued: Yes (comment) (OT back handout provided and reviewed) Recall of Precautions/Restrictions: Intact Restrictions Other Position/Activity Restrictions: back precautions     Mobility Bed Mobility               General bed mobility comments: Pt sitting in recliner at beginning and end of session. Verbally reviewed log roll technique with pt reporting he has been using log roll technique for several years due to back pain and prior back surgeries.    Transfers Overall transfer level: Needs assistance Equipment used: Rolling walker (2 wheels) Transfers: Sit to/from Stand, Bed to chair/wheelchair/BSC Sit to Stand: Supervision     Step pivot transfers: Supervision     General transfer comment: Sueprvision for safety; adhering to back precautions      Balance Overall balance assessment: Mild deficits observed, not formally tested                                         ADL either performed or assessed with clinical judgement   ADL Overall ADL's : Needs assistance/impaired Eating/Feeding: Independent;Sitting   Grooming: Supervision/safety;Standing (adhering to back precautions)   Upper Body Bathing: Contact guard assist;Sitting;Cueing for compensatory techniques (adhering to back precautions)   Lower Body Bathing: Contact guard assist;Sitting/lateral leans;Sit to/from stand;Cueing for compensatory techniques (adhering to back precautions)   Upper Body Dressing : Modified independent;Sitting (adhering to back precautions)   Lower Body Dressing: Supervision/safety;Sitting/lateral leans;Sit to/from stand;Cueing for compensatory techniques (adhering to back precautions)   Toilet Transfer: Supervision/safety;Ambulation;Rolling walker (2 wheels);Regular Toilet (adhering to back precautions)   Toileting-  Clothing Manipulation and Hygiene: Supervision/safety;Cueing for compensatory techniques;Sitting/lateral lean;Sit to/from stand (adhering to back  precautions)       Functional mobility during ADLs: Supervision/safety;Rolling walker (2 wheels) (adhering to back precautions) General ADL Comments: Pt reports wife is able to provide needed assist at home.     Vision Baseline Vision/History: 1 Wears glasses Ability to See in Adequate Light: 0 Adequate (with glasses) Patient Visual Report: No change from baseline Vision Assessment?: No apparent visual deficits Additional Comments: not formally screened or evaluated     Perception         Praxis         Pertinent Vitals/Pain Pain Assessment Pain Assessment: 0-10 Pain Score: 5  Pain Location: back and flank at incision site Pain Descriptors / Indicators: Aching, Discomfort, Grimacing, Guarding, Sore Pain Intervention(s): Limited activity within patient's tolerance, Monitored during session, Premedicated before session, Repositioned     Extremity/Trunk Assessment Upper Extremity Assessment Upper Extremity Assessment: Right hand dominant;Overall [facility] for tasks assessed   Lower Extremity Assessment Lower Extremity Assessment: Defer to PT evaluation   Cervical / Trunk Assessment Cervical / Trunk Assessment: Back Surgery   Communication Communication Communication: No apparent difficulties   Cognition Arousal: Alert Behavior During Therapy: WFL for tasks assessed/performed Cognition: No apparent impairments             OT - Cognition Comments: Pt AAOx4 and pleasant throughout session. Cognition WFL for tasks assessed.                 Following commands: Intact       Cueing  General Comments   Cueing Techniques: Verbal cues  VSS on RA   Exercises     Shoulder Instructions      Home Living Family/patient expects to be discharged to:: Private residence Living Arrangements: Spouse/significant other Available Help at Discharge: Family;Available 24 hours/day Type of Home: House Home Access: Stairs to enter [employer] of Steps:  4-5 Entrance Stairs-Rails: Right;Left;Can reach both Home Layout: One level     Bathroom Shower/Tub: Chief Strategy Officer: Handicapped height     Home Equipment: Agricultural consultant (2 wheels);Shower seat          Prior Functioning/Environment Prior Level of Function : Independent/Modified Independent;Driving             Mobility Comments: Mod I with use of RW; pt reports no falls in the past 6 months ADLs Comments: Ind with ADLs; PRN assist from family for IADLs; drives; enjoys working on cars, but back pain has limited his ability to do this    OT Problem List: Decreased knowledge of precautions;Pain   OT Treatment/Interventions:        OT Goals(Current goals can be found in the care plan section)   Acute Rehab OT Goals Patient Stated Goal: to return home OT Goal Formulation: All assessment and education complete, DC therapy   OT Frequency:       Co-evaluation              AM-PAC OT 6 Clicks Daily Activity     Outcome Measure Help from another person eating meals?: None Help from another person taking care of personal grooming?: A Little Help from another person toileting, which includes using toliet, bedpan, or urinal?: A Little Help from another person bathing (including washing, rinsing, drying)?: A Little Help from another person to put on and taking off regular upper body clothing?: A Little Help from another person to put on and taking  off regular lower body clothing?: A Little 6 Click Score: 19   End of Session Equipment Utilized During Treatment: Rolling walker (2 wheels) Nurse Communication: Mobility status;Other (comment);Precautions (PT ready to discharge from an OT standpoint)  Activity Tolerance: Patient tolerated treatment well Patient left: in chair;with call bell/phone within reach;with chair alarm set  OT Visit Diagnosis: Pain                Time: 8845-8791 OT Time Calculation (min): 14 min Charges:  OT General  Charges $OT Visit: 1 Visit OT Evaluation $OT Eval Low Complexity: 1 Low  Margarie Rockey HERO., OTR/L, MA Acute Rehab [phone]   Margarie FORBES Horns 05/25/2024, 2:36 PM

## 2024-05-25 NOTE — Discharge Summary (Signed)
 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 male who was admitted after undergoing L4-5 lateral interbody fusion and posterior instrumented spinal fusion. The patient had significant pain immediately after surgery, but pain eventually was controlled with a multimodal regimen including oxycodone . Labs during the hospitalization revealed no significant anemia or electrolyte abnormalities. The patient worked with physical therapy who recommended discharge to home. The patient was tolerating an oral diet without issue and was voiding spontaneously after surgery. The patient's vitals were stable on the day of discharge. The patient was medically ready for discharge and was discharge to home on post-operative day one.  Instructions:   Orthopedic Surgery Discharge Instructions  Patient name: Randall [name] Procedure Performed: L3-5 lateral interbody fusion and posterior instrumented spinal fusion Date of Surgery: 05/24/2024 Surgeon: Ozell Ada, MD  Pre-operative Diagnosis: lumbar radiculopathy Post-operative Diagnosis: same as above  Discharge Date: 05/25/2024 Discharged to: home Discharge Condition: stable  Activity: You should refrain from bending, lifting, or twisting with objects greater than ten pounds until three months after surgery. You are encouraged to walk as much as desired. You can perform household activities such as cleaning dishes, doing laundry, vacuuming, etc. as long as the ten-pound restriction is followed. You do not need to wear a brace during the post-operative period.   Incision Care: Your incisions site have dressings over them. These dressings should remain in place and dry at all times for a  total of one week after surgery. After one week, you can remove the dressing. Underneath the dressing, you will find skin glue. You should leave the skin glue in place. It will fall off with time. Do not pick, rub, or scrub at it. Do not put cream or lotion over the surgical area. After one week and once the dressing is off, it is okay to let soap and water run over your incision. Again, do not pick, scrub, or rub at the skin glue when bathing. Do not submerge (e.g., take a bath, swim, go in a hot tub, etc.) until six weeks after surgery. There may be some bloody drainage from the incision into the dressing after surgery. This is normal. You do not need to replace the dressing. Continue to leave it in place for the one week as instructed above. Should the dressing become saturated with blood or drainage, please call the office for further instructions.   Medications: You have been prescribed oxycodone . This is a narcotic pain medication and should only be taken as prescribed. I prescribed a dosage that is intended to be taken on top of the percocet that you already have. You can continue to take the 10mg  of Precocet up to every 4 hours and you can use 5 additional mg of oxycodone  with that to help with your pain. As your pain gets better, you should wean off of the 5mg  of oxycodone  that I prescribed. You should not drink alcohol  or operate heavy machinery (including driving) while taking the narcotics. The oxycodone  can cause constipation as a side effect. For that reason, you have been prescribed senna and miralax . These are both laxatives. You do not need to take this medication if you develop diarrhea. Should you remain constipated even while taking these medications, please increase the dose of miralax  to  twice daily. Tizanidine  is a muscle relaxer that has been prescribed to you for muscle spasm type pain. Take this medication as needed.   Do not take NSAIDs (ibuprofen, Aleve, Celebrex, naproxen, meloxicam,  etc.) for the first 6 weeks after surgery as there is some evidence that their use may decrease the chances of successful fusion.   In order to set expectations for opioid prescriptions, you will only be prescribed opioids for a total of six weeks after surgery and, at two-weeks after surgery, your opioid prescription will start to tapered (decreased dosage and number of pills). If you have ongoing need for opioid medication six weeks after surgery, you will be referred to pain management. If you are already established with a provider that is giving you opioid medications, you should schedule an appointment with them for six weeks after surgery if you feel you are going to need another prescription. State law only allows for opioid prescriptions one week at a time. If you are running out of opioid medication near the end of the week, please call the office during business hours before running out so I can send you another prescription.   You may resume any home blood thinners (warfarin, lovenox, apixaban, plavix, xarelto, etc) 72 hours after your surgery. Take these medications as they were previously prescribed.  Driving: You should not drive while taking narcotic pain medications. You should start getting back to driving slowly and you may want to try driving in a parking lot before doing anything more.   Diet: You are safe to resume your regular diet after surgery.   Reasons to Call the Office After Surgery: You should feel free to call the office with any concerns or questions you have in the post-operative period, but you should definitely notify the office if you develop: -shortness of breath, chest pain, or trouble breathing -excessive bleeding, drainage, redness, or swelling around the surgical site -fevers, chills, or pain that is getting worse with each passing day -persistent nausea or vomiting -new weakness in either leg -new or worsening numbness or tingling in either leg -numbness in  the groin, bowel or bladder incontinence -other concerns about your surgery  Follow Up Appointments: You should have an office appointment scheduled for approximately two weeks after surgery. If you do not remember when this appointment is or do not already have it scheduled, please call the office to schedule.   Office Information:  -Ozell Ada, MD -Phone number: [phone] -Address: [address]       [place], [place] [address]

## 2024-05-25 NOTE — Plan of Care (Signed)
   Problem: Education: Goal: Knowledge of General Education information will improve Description Including pain rating scale, medication(s)/side effects and non-pharmacologic comfort measures Outcome: Progressing   Problem: Health Behavior/Discharge Planning: Goal: Ability to manage health-related needs will improve Outcome: Progressing

## 2024-05-25 NOTE — Discharge Instructions (Signed)
 Orthopedic Surgery Discharge Instructions  Patient name: Randall [name] Procedure Performed: L3-5 lateral interbody fusion and posterior instrumented spinal fusion Date of Surgery: 05/24/2024 Surgeon: Ozell Ada, MD  Pre-operative Diagnosis: lumbar radiculopathy Post-operative Diagnosis: same as above  Discharge Date: 05/25/2024 Discharged to: home Discharge Condition: stable  Activity: You should refrain from bending, lifting, or twisting with objects greater than ten pounds until three months after surgery. You are encouraged to walk as much as desired. You can perform household activities such as cleaning dishes, doing laundry, vacuuming, etc. as long as the ten-pound restriction is followed. You do not need to wear a brace during the post-operative period.   Incision Care: Your incisions site have dressings over them. These dressings should remain in place and dry at all times for a total of one week after surgery. After one week, you can remove the dressing. Underneath the dressing, you will find skin glue. You should leave the skin glue in place. It will fall off with time. Do not pick, rub, or scrub at it. Do not put cream or lotion over the surgical area. After one week and once the dressing is off, it is okay to let soap and water run over your incision. Again, do not pick, scrub, or rub at the skin glue when bathing. Do not submerge (e.g., take a bath, swim, go in a hot tub, etc.) until six weeks after surgery. There may be some bloody drainage from the incision into the dressing after surgery. This is normal. You do not need to replace the dressing. Continue to leave it in place for the one week as instructed above. Should the dressing become saturated with blood or drainage, please call the office for further instructions.   Medications: You have been prescribed oxycodone . This is a narcotic pain medication and should only be taken as prescribed. I prescribed a dosage that is intended  to be taken on top of the percocet that you already have. You can continue to take the 10mg  of Precocet up to every 4 hours and you can use 5 additional mg of oxycodone  with that to help with your pain. As your pain gets better, you should wean off of the 5mg  of oxycodone  that I prescribed. You should not drink alcohol  or operate heavy machinery (including driving) while taking the narcotics. The oxycodone  can cause constipation as a side effect. For that reason, you have been prescribed senna and miralax . These are both laxatives. You do not need to take this medication if you develop diarrhea. Should you remain constipated even while taking these medications, please increase the dose of miralax  to twice daily. Tizanidine  is a muscle relaxer that has been prescribed to you for muscle spasm type pain. Take this medication as needed.   Do not take NSAIDs (ibuprofen, Aleve, Celebrex, naproxen, meloxicam, etc.) for the first 6 weeks after surgery as there is some evidence that their use may decrease the chances of successful fusion.   In order to set expectations for opioid prescriptions, you will only be prescribed opioids for a total of six weeks after surgery and, at two-weeks after surgery, your opioid prescription will start to tapered (decreased dosage and number of pills). If you have ongoing need for opioid medication six weeks after surgery, you will be referred to pain management. If you are already established with a provider that is giving you opioid medications, you should schedule an appointment with them for six weeks after surgery if you feel you are  going to need another prescription. State law only allows for opioid prescriptions one week at a time. If you are running out of opioid medication near the end of the week, please call the office during business hours before running out so I can send you another prescription.   You may resume any home blood thinners (warfarin, lovenox, apixaban,  plavix, xarelto, etc) 72 hours after your surgery. Take these medications as they were previously prescribed.  Driving: You should not drive while taking narcotic pain medications. You should start getting back to driving slowly and you may want to try driving in a parking lot before doing anything more.   Diet: You are safe to resume your regular diet after surgery.   Reasons to Call the Office After Surgery: You should feel free to call the office with any concerns or questions you have in the post-operative period, but you should definitely notify the office if you develop: -shortness of breath, chest pain, or trouble breathing -excessive bleeding, drainage, redness, or swelling around the surgical site -fevers, chills, or pain that is getting worse with each passing day -persistent nausea or vomiting -new weakness in either leg -new or worsening numbness or tingling in either leg -numbness in the groin, bowel or bladder incontinence -other concerns about your surgery  Follow Up Appointments: You should have an office appointment scheduled for approximately two weeks after surgery. If you do not remember when this appointment is or do not already have it scheduled, please call the office to schedule.   Office Information:  -Ozell Ada, MD -Phone number: [phone] -Address: [address]       [place], [place] [address]

## 2024-05-25 NOTE — Progress Notes (Signed)
 Removed IV-CDI. Reviewed d/c paperwork with patient and answered questions. Wheeled stable patient and belongings to [facility] pharmacy where we picked up his home meds and then I wheeled him to the main entrance where he was picked up by his wife.

## 2024-05-25 NOTE — Plan of Care (Signed)
  Problem: Education: Goal: Knowledge of General Education information will improve Description: Including pain rating scale, medication(s)/side effects and non-pharmacologic comfort measures 05/25/2024 0814 by Delores Kirsch, RN Outcome: Adequate for Discharge 05/25/2024 0813 by Delores Kirsch, RN Outcome: Progressing   Problem: Health Behavior/Discharge Planning: Goal: Ability to manage health-related needs will improve 05/25/2024 0814 by Delores Kirsch, RN Outcome: Adequate for Discharge 05/25/2024 0813 by Delores Kirsch, RN Outcome: Progressing   Problem: Clinical Measurements: Goal: Ability to maintain clinical measurements within normal limits will improve Outcome: Adequate for Discharge Goal: Will remain free from infection Outcome: Adequate for Discharge Goal: Diagnostic test results will improve Outcome: Adequate for Discharge Goal: Respiratory complications will improve Outcome: Adequate for Discharge Goal: Cardiovascular complication will be avoided Outcome: Adequate for Discharge   Problem: Activity: Goal: Risk for activity intolerance will decrease Outcome: Adequate for Discharge   Problem: Nutrition: Goal: Adequate nutrition will be maintained Outcome: Adequate for Discharge   Problem: Coping: Goal: Level of anxiety will decrease Outcome: Adequate for Discharge   Problem: Elimination: Goal: Will not experience complications related to bowel motility Outcome: Adequate for Discharge Goal: Will not experience complications related to urinary retention Outcome: Adequate for Discharge   Problem: Pain Managment: Goal: General experience of comfort will improve and/or be controlled Outcome: Adequate for Discharge   Problem: Safety: Goal: Ability to remain free from injury will improve Outcome: Adequate for Discharge   Problem: Skin Integrity: Goal: Risk for impaired skin integrity will decrease Outcome: Adequate for Discharge

## 2024-05-26 ENCOUNTER — Telehealth: Payer: Self-pay | Admitting: Orthopedic Surgery

## 2024-05-26 DIAGNOSIS — J440 Chronic obstructive pulmonary disease with acute lower respiratory infection: Secondary | ICD-10-CM | POA: Diagnosis not present

## 2024-05-26 DIAGNOSIS — I10 Essential (primary) hypertension: Secondary | ICD-10-CM | POA: Diagnosis not present

## 2024-05-26 NOTE — Telephone Encounter (Signed)
 Patient called and needs a refill on the oxycodone . He stated that the 5mg  isn't helping at all.  CB#[phone]

## 2024-06-06 ENCOUNTER — Ambulatory Visit (INDEPENDENT_AMBULATORY_CARE_PROVIDER_SITE_OTHER): Admitting: Orthopedic Surgery

## 2024-06-06 ENCOUNTER — Other Ambulatory Visit: Payer: Self-pay

## 2024-06-06 ENCOUNTER — Other Ambulatory Visit (INDEPENDENT_AMBULATORY_CARE_PROVIDER_SITE_OTHER): Payer: Self-pay

## 2024-06-06 DIAGNOSIS — Z981 Arthrodesis status: Secondary | ICD-10-CM

## 2024-06-06 MED ORDER — OXYCODONE HCL 5 MG PO TABS: 5.0000 mg | ORAL_TABLET | ORAL | 0 refills | Status: DC | PRN

## 2024-06-06 NOTE — Progress Notes (Signed)
 Orthopedic Surgery Post-operative Office Visit  Procedure: L3-5 XLIF and PSIF Date of Surgery: 05/24/2024 (~2 weeks post-op)  Assessment: Patient is a 78 y.o. who is not having any radiating leg pain but is still having significant back pain   Plan: -Operative plans complete -Out of bed as tolerated, no brace -No bending/lifting/twisting greater than 10 pounds -Okay to let soap/water run over the incisions but do not submerge -Patient does have left hip flexor weakness which is the approach side for the XLIF.  I explained to him that typically this improves with time so we will continue to monitor for now -Pain management: weaning oxycodone , tylenol  -Return to office in 4 weeks, x-rays needed at next visit: AP/lateral lumbar  ___________________________________________________________________________   Subjective: Patient has not had any radiating leg pain since discharge from the hospital.  He still has significant back pain.  He notes that when he is active or rotates to the right.  He has been using oxycodone  up until this point to help with the pain.  He has run out as of this morning.  He has not noticed any redness or drainage around his incisions.  Objective:  General: no acute distress, appropriate affect Neurologic: alert, answering questions appropriately, following commands Respiratory: unlabored breathing on room air Skin: incisions are well approximated with no erythema, induration, active/expressible drainage  MSK (spine):  -Strength exam      Left  Right  EHL    5/5  5/5 TA    5/5  5/5 GSC    5/5  5/5 Knee extension  5/5  5/5 Hip flexion   4-/5  5/5  -Sensory exam    Sensation intact to light touch in L3-S1 nerve distributions of bilateral lower extremities  Imaging: X-rays of the lumbar spine from 06/06/2024 were independently reviewed and interpreted, showing interbody devices at L3/4 and L4/5. Interbodies appear in appropriate position with no lucency  around the interbody devices. Posterior instrumentation from L3-5. No lucency seen around the screws. No spondylolisthesis seen. No fracture or dislocation seen.    Patient name: [name] Patient MRN: [id number] Date of visit: 06/06/24

## 2024-06-07 ENCOUNTER — Telehealth: Payer: Self-pay | Admitting: Orthopedic Surgery

## 2024-06-07 DIAGNOSIS — M545 Low back pain, unspecified: Secondary | ICD-10-CM

## 2024-06-07 NOTE — Telephone Encounter (Signed)
 Patient called and said that [facility] in [name] don't want to refill the Oxycodone  because how many times it was written. [phone] He said he seen someone else before [facility]ina and they was writing prescriptions as well but he said he needs the medication he stated.

## 2024-06-08 MED ORDER — OXYCODONE HCL 5 MG PO TABS: 5.0000 mg | ORAL_TABLET | ORAL | 0 refills | Status: AC | PRN

## 2024-06-08 NOTE — Addendum Note (Signed)
 Addended by: GEORGINA SHARPER on: 06/08/2024 10:49 AM   Modules accepted: Orders

## 2024-06-09 MED ORDER — METHYLPREDNISOLONE 4 MG PO TBPK: ORAL_TABLET | ORAL | 0 refills | Status: AC

## 2024-06-09 NOTE — Addendum Note (Signed)
 Addended by: GEORGINA SHARPER on: 06/09/2024 12:39 PM   Modules accepted: Orders

## 2024-06-13 ENCOUNTER — Encounter: Admitting: Orthopedic Surgery

## 2024-06-16 MED ORDER — OXYCODONE HCL 5 MG PO TABS: 5.0000 mg | ORAL_TABLET | ORAL | 0 refills | Status: AC | PRN

## 2024-06-16 NOTE — Addendum Note (Signed)
 Addended by: GEORGINA SHARPER on: 06/16/2024 09:49 AM   Modules accepted: Orders

## 2024-06-17 MED ORDER — OXYCODONE HCL 5 MG PO TABS: 5.0000 mg | ORAL_TABLET | ORAL | 0 refills | Status: AC | PRN

## 2024-06-17 NOTE — Addendum Note (Signed)
 Addended by: GEORGINA SHARPER on: 06/17/2024 01:19 PM   Modules accepted: Orders

## 2024-06-26 DIAGNOSIS — J440 Chronic obstructive pulmonary disease with acute lower respiratory infection: Secondary | ICD-10-CM | POA: Diagnosis not present

## 2024-06-26 DIAGNOSIS — I10 Essential (primary) hypertension: Secondary | ICD-10-CM | POA: Diagnosis not present

## 2024-06-28 DIAGNOSIS — Z01818 Encounter for other preprocedural examination: Secondary | ICD-10-CM

## 2024-07-06 DIAGNOSIS — Z79899 Other long term (current) drug therapy: Secondary | ICD-10-CM | POA: Diagnosis not present

## 2024-07-06 DIAGNOSIS — Z6826 Body mass index (BMI) 26.0-26.9, adult: Secondary | ICD-10-CM | POA: Diagnosis not present

## 2024-07-06 DIAGNOSIS — M4727 Other spondylosis with radiculopathy, lumbosacral region: Secondary | ICD-10-CM | POA: Diagnosis not present

## 2024-07-06 DIAGNOSIS — M129 Arthropathy, unspecified: Secondary | ICD-10-CM | POA: Diagnosis not present

## 2024-07-06 DIAGNOSIS — M48061 Spinal stenosis, lumbar region without neurogenic claudication: Secondary | ICD-10-CM | POA: Diagnosis not present

## 2024-07-06 DIAGNOSIS — I10 Essential (primary) hypertension: Secondary | ICD-10-CM | POA: Diagnosis not present

## 2024-07-06 MED ORDER — OXYCODONE HCL 5 MG PO TABS: 10.0000 mg | ORAL_TABLET | Freq: Four times a day (QID) | ORAL | 0 refills | Status: AC | PRN

## 2024-07-06 NOTE — Addendum Note (Signed)
 Addended by: GEORGINA SHARPER on: 07/06/2024 11:07 AM   Modules accepted: Orders

## 2024-07-09 DIAGNOSIS — J440 Chronic obstructive pulmonary disease with acute lower respiratory infection: Secondary | ICD-10-CM | POA: Diagnosis not present

## 2024-07-09 DIAGNOSIS — I10 Essential (primary) hypertension: Secondary | ICD-10-CM | POA: Diagnosis not present

## 2024-07-11 ENCOUNTER — Encounter: Admitting: Orthopedic Surgery

## 2024-07-12 DIAGNOSIS — Z79899 Other long term (current) drug therapy: Secondary | ICD-10-CM | POA: Diagnosis not present

## 2024-07-22 ENCOUNTER — Other Ambulatory Visit (HOSPITAL_COMMUNITY): Payer: Self-pay

## 2024-07-26 DIAGNOSIS — Z23 Encounter for immunization: Secondary | ICD-10-CM | POA: Diagnosis not present

## 2024-07-26 DIAGNOSIS — I10 Essential (primary) hypertension: Secondary | ICD-10-CM | POA: Diagnosis not present

## 2024-07-26 DIAGNOSIS — J440 Chronic obstructive pulmonary disease with acute lower respiratory infection: Secondary | ICD-10-CM | POA: Diagnosis not present

## 2024-08-03 DIAGNOSIS — M4727 Other spondylosis with radiculopathy, lumbosacral region: Secondary | ICD-10-CM | POA: Diagnosis not present

## 2024-08-03 DIAGNOSIS — E538 Deficiency of other specified B group vitamins: Secondary | ICD-10-CM | POA: Diagnosis not present

## 2024-08-03 DIAGNOSIS — Z6826 Body mass index (BMI) 26.0-26.9, adult: Secondary | ICD-10-CM | POA: Diagnosis not present

## 2024-08-03 DIAGNOSIS — I10 Essential (primary) hypertension: Secondary | ICD-10-CM | POA: Diagnosis not present

## 2024-08-03 DIAGNOSIS — M48061 Spinal stenosis, lumbar region without neurogenic claudication: Secondary | ICD-10-CM | POA: Diagnosis not present

## 2024-08-04 MED ORDER — SENNA-DOCUSATE SODIUM 8.6-50 MG PO TABS: 1.0000 | ORAL_TABLET | Freq: Two times a day (BID) | ORAL | 0 refills | Status: AC

## 2024-08-04 NOTE — Addendum Note (Signed)
 Addended by: GEORGINA SHARPER on: 08/04/2024 04:15 PM   Modules accepted: Orders

## 2024-08-08 DIAGNOSIS — J440 Chronic obstructive pulmonary disease with acute lower respiratory infection: Secondary | ICD-10-CM | POA: Diagnosis not present

## 2024-08-08 DIAGNOSIS — I10 Essential (primary) hypertension: Secondary | ICD-10-CM | POA: Diagnosis not present

## 2024-08-24 ENCOUNTER — Other Ambulatory Visit (HOSPITAL_COMMUNITY): Payer: Self-pay | Admitting: Primary Care

## 2024-08-24 ENCOUNTER — Telehealth: Payer: Self-pay | Admitting: Primary Care

## 2024-08-24 DIAGNOSIS — M48061 Spinal stenosis, lumbar region without neurogenic claudication: Secondary | ICD-10-CM | POA: Diagnosis not present

## 2024-08-24 DIAGNOSIS — Z6826 Body mass index (BMI) 26.0-26.9, adult: Secondary | ICD-10-CM | POA: Diagnosis not present

## 2024-08-24 DIAGNOSIS — R1310 Dysphagia, unspecified: Secondary | ICD-10-CM

## 2024-08-24 DIAGNOSIS — R059 Cough, unspecified: Secondary | ICD-10-CM

## 2024-08-24 DIAGNOSIS — M4727 Other spondylosis with radiculopathy, lumbosacral region: Secondary | ICD-10-CM | POA: Diagnosis not present

## 2024-08-24 NOTE — Telephone Encounter (Signed)
 Patient is wondering if they still need to have the Barium Swallow test?

## 2024-08-24 NOTE — Telephone Encounter (Signed)
 This was ordered back in May due to chronic cough and CT imaging that showed endobronchial debris with chronic volume loss in the right lower lobe. Findings may be due to chronic aspiration.   If no longer having cough or trouble swallowing we can cancel, but I would keep visit if still dealing with these symptoms

## 2024-08-26 DIAGNOSIS — I10 Essential (primary) hypertension: Secondary | ICD-10-CM | POA: Diagnosis not present

## 2024-08-26 DIAGNOSIS — J440 Chronic obstructive pulmonary disease with acute lower respiratory infection: Secondary | ICD-10-CM | POA: Diagnosis not present

## 2024-08-26 NOTE — Telephone Encounter (Signed)
 LVM for patient to let them know that if it's not necessary we can have it canceled to call us  back if it needs to be canceled.

## 2024-08-29 ENCOUNTER — Encounter: Payer: Self-pay | Admitting: Radiology

## 2024-09-07 DIAGNOSIS — J440 Chronic obstructive pulmonary disease with acute lower respiratory infection: Secondary | ICD-10-CM | POA: Diagnosis not present

## 2024-09-07 DIAGNOSIS — I10 Essential (primary) hypertension: Secondary | ICD-10-CM | POA: Diagnosis not present

## 2024-09-12 ENCOUNTER — Ambulatory Visit (HOSPITAL_COMMUNITY)
Admission: RE | Admit: 2024-09-12 | Discharge: 2024-09-12 | Disposition: A | Discharge status: Home / Self Care | Source: Ambulatory Visit | Attending: Family Medicine | Admitting: Family Medicine

## 2024-09-12 ENCOUNTER — Ambulatory Visit (HOSPITAL_COMMUNITY)
Admission: RE | Admit: 2024-09-12 | Discharge: 2024-09-12 | Disposition: A | Source: Ambulatory Visit | Attending: Family Medicine | Admitting: Family Medicine

## 2024-09-12 DIAGNOSIS — R059 Cough, unspecified: Secondary | ICD-10-CM | POA: Insufficient documentation

## 2024-09-12 DIAGNOSIS — R1310 Dysphagia, unspecified: Secondary | ICD-10-CM | POA: Diagnosis not present

## 2024-09-12 DIAGNOSIS — R09A2 Foreign body sensation, throat: Secondary | ICD-10-CM | POA: Insufficient documentation

## 2024-09-12 DIAGNOSIS — R1319 Other dysphagia: Secondary | ICD-10-CM | POA: Diagnosis not present

## 2024-09-12 NOTE — Progress Notes (Signed)
 Modified Barium Swallow Study  Patient Details  Name: [name] MRN: [id number] Date of Birth: 11-09-1945  Today's Date: 09/12/2024  Modified Barium Swallow completed.  Full report located under Chart Review in the Imaging Section.  History of Present Illness 78 yr old seen for outpatient MBS without significant concern for swallow difficulties stating glogus sensation every once in awhile, sometimes with pills. 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 Pt had  CT imaging that showed endobronchial debris with chronic volume loss in the right lower lobe. Findings may be due to chronic aspiration.   Clinical Impression Pt exhibited a normal oropharyngeal swallow with possible esophageal dysphagia. Laryngeal mobility and timing were adequate to prevent aspiration or significant residue. There was one instance of minimal high penetration on underside of epiglottis during multiple cup sip thin which can be typical. Penetrate was spontaneously ejected during subsequent sip thin. Of note pt coughed once when no material seen in vestibule or airway. His PES appeared slightly narrowed however thin and thick boluses passes wtihout dificulty. The pill paused in the valleculae and was transited with sip of water. Esophageal scan noted pill stopped mid esophagus but descended the esophagus with additional sip thin barium into GE junction. Recommend continue regular texture, thin liquids, follow pills with additional sips liquid. Pt educated on general esophageal precautions. May consider GI consult if globus sensation continues. Factors that may increase risk of adverse event in presence of aspiration Noe & Lianne 2021):    Swallow Evaluation Recommendations Recommendations: PO diet PO Diet Recommendation: Regular;Thin liquids (Level 0) Liquid Administration via: Cup;Straw Medication Administration: Whole meds with liquid Supervision: Patient able to self-feed Swallowing strategies   :  (follow pills with additiona liquid) Postural changes: Position pt fully upright for meals;Stay upright 30-60 min after meals Oral care recommendations: Oral care BID (2x/day) Recommended consults: Consider GI consultation (if globus sensation worsens)      Dustin Olam Bull 09/12/2024,5:48 PM

## 2024-09-14 ENCOUNTER — Ambulatory Visit: Payer: Self-pay | Admitting: Internal Medicine

## 2024-09-14 NOTE — Telephone Encounter (Signed)
 Appt made for Ramaswamy for 09/15/2024

## 2024-09-14 NOTE — Telephone Encounter (Signed)
 FYI Only or Action Required?: FYI only for provider: appointment scheduled on 09/15/2024 at 1 PM.  Patient is followed in Pulmonology for COPD, last seen on 05/03/2024 by Geronimo Amel, MD.  Called Nurse Triage reporting Shortness of Breath.  Symptoms began a week ago.  Interventions attempted: Rescue inhaler, Maintenance inhaler, and Nebulizer treatments.  Symptoms are: unchanged.  Triage Disposition: See Physician Within 24 Hours (overriding See HCP Within 4 Hours (Or PCP Triage))  Patient/caregiver understands and will follow disposition?: Yes  E2C2 Pulmonary Triage - Initial Assessment Questions "Chief Complaint (e.g., cough, sob, wheezing, fever, chills, sweat or additional symptoms) *Go to specific symptom protocol after initial questions. Patient with hx of COPD reports having shortness of breath and productive cough. Shortness of breath is mild in nature. Endorses wheezing at times. Endorses having difficulty sleeping due to symptoms. Reports good use with inhalers-Albuterol  inhaler is needing to be refilled. Reports using his nebulizer. No CP, fever, chills, or sweating. Patient scheduled to see pulmonologist tomorrow at 1 PM.    "How long have symptoms been present?" Started a week ago  Have you tested for COVID or Flu? Note: If not, ask patient if a home test can be taken. If so, instruct patient to call back for positive results. No  MEDICINES:   "Have you used any OTC meds to help with symptoms?" No If yes, ask "What medications?"   "Have you used your inhalers/maintenance medication?" Yes If yes, "What medications?" Breztri  Albuterol  nebulizer  If inhaler, ask "How many puffs and how often?" Note: Review instructions on medication in the chart. Breztri  2 puffs BID  OXYGEN: "Do you wear supplemental oxygen?" No If yes, "How many liters are you supposed to use?"   "Do you monitor your oxygen levels?" Yes If yes, What is your reading (oxygen level)  today? 94%  What is your usual oxygen saturation reading?  (Note: Pulmonary O2 sats should be 90% or greater) 94-95%   Copied from CRM #[id number]. Topic: Clinical - Red Word Triage >> Sep 14, 2024  9:40 AM Rozanna MATSU wrote: Red Word that prompted transfer to Nurse Triage: COPD, coughing with mucus >> Sep 14, 2024  9:41 AM Rozanna MATSU wrote: Shortness of breath also Reason for Disposition  [1] MILD difficulty breathing (e.g., minimal/no SOB at rest, SOB with walking, pulse < 100) AND [2] NEW-onset or WORSE than normal  Answer Assessment - Initial Assessment Questions 6. CARDIAC HISTORY: Do you have any history of heart disease? (e.g., heart attack, angina, bypass surgery, angioplasty)      no 7. LUNG HISTORY: Do you have any history of lung disease?  (e.g., pulmonary embolus, asthma, emphysema)     yes 12. TRAVEL: Have you traveled out of the country in the last month? (e.g., travel history, exposures)       no  Protocols used: Breathing Difficulty-A-AH

## 2024-09-14 NOTE — Progress Notes (Unsigned)
 OV 01/14/2024  Subjective:  Patient ID: Randall [name], adult , DOB: [birth year] , age 78 y.o. , MRN: [id number] , ADDRESS: [address] [place] [address] PCP Morgan Drivers, MD Patient Care Team: Morgan Drivers, MD as PCP - General (Family Medicine)  This Provider for this visit: Treatment Team:  Attending Provider: Geronimo Amel, MD    01/14/2024 -   Chief Complaint  Patient presents with   Follow-up    COPD consult      HPI Randall [name] 78 y.o. -new consult referred for possible COPD evaluation.  In 2021 up until then he was undergoing lung cancer screening and then he stopped.  Back then his CT reported some mild emphysema.  He states he been aware of emphysema diagnosis for 6 or 7 years and is on Symbicort  with which she is compliant.  He says at baseline he can do yard work and get winded but otherwise very minimal shortness of breath and almost no cough.  Then approximately 3 weeks ago he had episode of COPD flareup.  He said it was really bad his pulse ox was 87%.  He just had 1 day of shortness of breath cough and chest tightness and wheezing.  He went to the urgent care got nebulizers and 6-day of steroids.  He was discharged from the urgent care when his pulse ox improved to 92%.  He is significantly improved but he has a residual mild cough with some white sputum but overall he is doing well.  He is a heavy smoker started smoking at age 41 quit in 2017 around age 27.  Smoked 1 pack/day.  There is no family history of COPD or lung disease.  He is a retired programmer, multimedia.  He has past medical history of high blood pressure and hyperlipidemia and COPD from 6 or 7 years ago.  He is had back surgery.  No MI no stroke no cancer\   He has upcoming colonoscopy in 1 month and he wants preoperative clearance for that.      CT Chest data from date:*  - personally visualized and independently interpreted : yes - my findings are: agree   Narrative & Impression  CLINICAL DATA:  78 year old male former smoker, quit 3 years ago, with 41 pack-year history of smoking, for follow-up lung cancer screening   EXAM: CT CHEST WITHOUT CONTRAST LOW-DOSE FOR LUNG CANCER SCREENING   TECHNIQUE: Multidetector CT imaging of the chest was performed following the standard protocol without IV contrast.   COMPARISON:  Low-dose lung cancer screening CT chest dated 11/25/2018   FINDINGS: Cardiovascular: Heart is normal in size.  No pericardial effusion.   No evidence of thoracic aortic aneurysm. Atherosclerotic calcifications of the aortic arch.   Three vessel coronary atherosclerosis.   Mediastinum/Nodes: No suspicious mediastinal lymphadenopathy.   Visualized thyroid  is unremarkable.   Lungs/Pleura: Mild centrilobular and paraseptal emphysematous changes, upper lung predominant.   No focal consolidation.   Platelike scarring/atelectasis in the right lower lobe. This obscures the prior right lower lobe nodule.   2.5 mm calcified granuloma in the right upper lobe, benign.   Chronic left pleural fluid/thickening.   No pneumothorax.   Upper Abdomen: Visualized upper abdomen is notable for a 4.6 cm posterior left upper pole renal cyst and vascular calcifications.   Musculoskeletal: Mild degenerative changes of the visualized thoracolumbar spine. Cervical spine fixation hardware, incompletely visualized.   IMPRESSION: Lung-RADS 1, negative. Continue annual screening with low-dose chest  CT without contrast in 12 months.   Aortic Atherosclerosis (ICD10-I70.0) and Emphysema (ICD10-J43.9).     Electronically Signed   By: Pinkie Pebbles M.D.   On: 11/28/2019 13:21      APP VISIT MAY 2025   03/24/2024 Discussed the use of AI scribe software for clinical note transcription with the patient, who gave verbal consent to proceed.  Patient presents today for 8-week follow-up for emphysema, chronic cough and dyspnea on  exertion.  He underwent a breathing test, CT scan, and lab work in March. His medication was changed from Symbicort  to Breztri , which improved his breathing. However, he ran out of Breztri  samples a month ago and resumed using Symbicort .  CT chest was negative for pulmonary fibrosis, however, it did show debris in his airways with chronic volume loss right lower lobe which may be from chronic aspiration.  Small right lower lobe nodule measuring 3.8 mm which is minimally increased from January 2020 and likely benign. If further evaluation is desired, follow-up in 1 year is recommended. Emphysema.  Pulmonary function testing in March 2025 showed severe obstructive airway disease with minimal diffusion defect/FEV1 50%.  He occasionally experiences trouble swallowing but denies frequent coughing after eating. He experiences wheezing, which he attributes to his throat, and occasional coughing without significant sputum production.  No symptoms of acid reflux such as burping or belching. He occasionally uses Mucinex when his cough worsens and does not use an incentive spirometer for breathing exercises.   PLAN d. We will continue with Breztri  to help manage your symptoms,  -SMOKING  We will refer you to a lung cancer screening program if you are eligible and order a repeat CT scan in one year to monitor the nodule.    Please follow up with the barium swallow study as ordered.  Follow-up 6 months with Dr. Geronimo or sooner if needed   OV 05/03/2024  Subjective:  Patient ID: Randall [name], male , DOB: [birth year] , age 78 y.o. , MRN: [id number] , ADDRESS: [address] [place] [address] PCP Frederik Lamar, MD Patient Care Team: Frederik Lamar, MD as PCP - General (Family Medicine) Geronimo Amel, MD as Consulting Physician (Pulmonary Disease)  This Provider for this visit: Treatment Team:  Attending Provider: Geronimo Amel, MD    05/03/2024 -   Chief Complaint  Patient  presents with   surgical clearance    PCP wanted to get surgical clearance for back surgery in Sept. 2025.     HPI Randall [name] 78 y.o. here for a preoperative evaluation.  Review of the records indicate that orthopedics and Ortho care has seen him and they are planning- a L3-5 POSTERIOR LATERAL INTERBIDY FUSION AND L3-5 POSTERIOR INSTRUMENTAL SPINAL FUSION under general anesthesia.  Patient record the surgery will be 3 hours long.  It would be under general anesthesia.  The surgery scheduled for September 2025.  He did see nurse practitioner in the preoperative request came after that.  The nurse practitioner did do a detailed telephone note giving him the preoperative clearance and doing all the risk assessment scores.  However, he is here for a visit and he tells me that the primary care wanted to visit.  He categorically states that today's visit is for preoperative clearance for the spine surgery.  In the interim he denies any complaints no hospitalizations no new medical problems no surgeries since his last visit with nurse practitioner.  In 2014 he did have back surgery and had no  problems but that was over 10 years ago.  No anesthesia complications.   He continues with his schedule inhalers of BREZTRI .  There are no other new issues.   Detailed preoperative is spent done -this is a new request for new preoperative clearance.    CT Chest data from date: be;ow  IMPRESSION: 1. No evidence of interstitial lung disease. 2. Endobronchial debris with chronic volume loss in the right lower lobe. Findings may be due to chronic aspiration. 3. 8 mm smoothly marginated right lower lobe nodule, minimally increased in size from 11/25/2018 but likely benign. If further evaluation is desired, follow-up in 1 year is recommended. 4. Aortic atherosclerosis (ICD10-I70.0). Coronary artery calcification. 5. Enlarged pulmonic trunk, indicative of pulmonary arterial hypertension. 6.  Emphysema  (ICD10-J43.9).     Electronically Signed   By: Newell Eke M.D.   On: 02/21/2024 15:52    OV 09/15/2024  Subjective:  Patient ID: Randall [name], male , DOB: 1946-01-25 , age 78 y.o. , MRN: [id number] , ADDRESS: [address] [place] [address] PCP Frederik Lamar, MD Patient Care Team: Frederik Lamar, MD as PCP - General (Family Medicine) Geronimo Amel, MD as Consulting Physician (Pulmonary Disease)  This Provider for this visit: Treatment Team:  Attending Provider: Geronimo Amel, MD    09/15/2024 -   Chief Complaint  Patient presents with   Acute Visit    Pt stated COPD had flare up couple of days ago SOB w/ any activity Prod cough (phlegm clear)     HPI Randall [name] 78 y.o. -    SYMPTOM SCALE -  01/14/2024  Current weight   O2 use ra  Shortness of Breath 0 -> 5 scale with 5 being worst (score 6 If unable to do)  At rest 0  Simple tasks - showers, clothes change, eating, shaving 0  Household (dishes, doing bed, laundry) 2  Shopping 0  Walking level at own pace 0  Walking up Stairs 2  Total (30-36) Dyspnea Score 4  How bad is your cough? 2  How bad is your fatigue 0  How bad is nausea 0  How bad is vomiting?  0  How bad is diarrhea? 0  How bad is anxiety? 00  How bad is depression 0  Any chronic pain - if so where and how bad 0      PFT     Latest Ref Rng & Units 01/15/2024   10:23 AM  PFT Results  FVC-Pre L 2.37   FVC-Predicted Pre % 66   FVC-Post L 2.50   FVC-Predicted Post % 70   Pre FEV1/FVC % % 49   Post FEV1/FCV % % 51   FEV1-Pre L 1.17   FEV1-Predicted Pre % 46   FEV1-Post L 1.28   DLCO uncorrected ml/min/mmHg 14.19   DLCO UNC% % 64   DLCO corrected ml/min/mmHg 14.19   DLCO COR %Predicted % 64   DLVA Predicted % 74   TLC L 7.16   TLC % Predicted % 114   RV % Predicted % 191        LAB RESULTS last 96 hours DG SWALLOW FUNC OP MEDICARE SPEECH PATH Result Date: 09/12/2024 Table formatting from the  original result was not included. Modified Barium Swallow Study Patient Details Name: MARKEESE BOYAJIAN MRN: [id number] Date of Birth: 03/06/1946 Today's Date: 09/12/2024 HPI/PMH: HPI: 78 yr old seen for outpatient MBS without significant concern for swallow difficulties stating glogus sensation every once in  awhile, sometimes with pills. 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 Pt had  CT imaging that showed endobronchial debris with chronic volume loss in the right lower lobe. Findings may be due to chronic aspiration. Clinical Impression: Clinical Impression: Pt exhibited a normal oropharyngeal swallow with possible esophageal dysphagia. Laryngeal mobility and timing were adequate to prevent aspiration or significant residue. There was one instance of minimal high penetration on underside of epiglottis during multiple cup sip thin which can be typical. Penetrate was spontaneously ejected during subsequent sip thin. Of note pt coughed once when no material seen in vestibule or airway. His PES appeared slightly narrowed however thin and thick boluses passes wtihout dificulty. The pill paused in the valleculae and was transited with sip of water. Esophageal scan noted pill stopped mid esophagus but descended the esophagus with additional sip thin barium into GE junction. Recommend continue regular texture, thin liquids, follow pills with additional sips liquid. Pt educated on general esophageal precautions. May consider GI consult if globus sensation continues. Factors that may increase risk of adverse event in presence of aspiration Noe & Lianne 2021): No data recorded Recommendations/Plan: Swallowing Evaluation Recommendations Swallowing Evaluation Recommendations Recommendations: PO diet PO Diet Recommendation: Regular; Thin liquids (Level 0) Liquid Administration via: Cup; Straw Medication Administration: Whole meds with liquid Supervision: Patient able to self-feed Swallowing strategies  : --  (follow pills with additiona liquid) Postural changes: Position pt fully upright for meals; Stay upright 30-60 min after meals Oral care recommendations: Oral care BID (2x/day) Recommended consults: Consider GI consultation (if globus sensation worsens) Treatment Plan Treatment Plan Treatment recommendations: No treatment recommended at this time Follow-up recommendations: No SLP follow up Functional status assessment: Patient has not had a recent decline in their functional status. Recommendations Recommendations for follow up therapy are one component of a multi-disciplinary discharge planning process, led by the attending physician.  Recommendations may be updated based on patient status, additional functional criteria and insurance authorization. Assessment: Orofacial Exam: Orofacial Exam Oral Cavity: Oral Hygiene: WFL Oral Cavity - Dentition: Dentures, top; Dentures, bottom (?) Orofacial Anatomy: WFL Oral Motor/Sensory Function: WFL Anatomy: Anatomy: Presence of cervical hardware Boluses Administered: Boluses Administered Boluses Administered: Mildly thick liquids (Level 2, nectar thick); Thin liquids (Level 0); Moderately thick liquids (Level 3, honey thick); Puree; Solid  Oral Impairment Domain: Oral Impairment Domain Lip Closure: No labial escape Tongue control during bolus hold: Cohesive bolus between tongue to palatal seal Bolus preparation/mastication: Timely and efficient chewing and mashing Bolus transport/lingual motion: Brisk tongue motion Oral residue: Trace residue lining oral structures Location of oral residue : -- (tongue base) Initiation of pharyngeal swallow : Valleculae  Pharyngeal Impairment Domain: Pharyngeal Impairment Domain Soft palate elevation: No bolus between soft palate (SP)/pharyngeal wall (PW) Laryngeal elevation: Complete superior movement of thyroid  cartilage with complete approximation of arytenoids to epiglottic petiole Anterior hyoid excursion: Complete anterior movement  Epiglottic movement: Complete inversion Laryngeal vestibule closure: Incomplete, narrow column air/contrast in laryngeal vestibule (with subsequent swallow) Pharyngeal stripping wave : Present - complete Pharyngeal contraction (A/P view only): N/A Pharyngoesophageal segment opening: Partial distention/partial duration, partial obstruction of flow (slight narrowing) Tongue base retraction: No contrast between tongue base and posterior pharyngeal wall (PPW) Pharyngeal residue: Complete pharyngeal clearance  Esophageal Impairment Domain: Esophageal Impairment Domain Esophageal clearance upright position: Esophageal retention (pill) Pill: Pill Consistency administered: Thin liquids (Level 0) Thin liquids (Level 0): Impaired (see clinical impressions) Penetration/Aspiration Scale Score: Penetration/Aspiration Scale Score 1.  Material does not enter airway: Mildly  thick liquids (Level 2, nectar thick); Moderately thick liquids (Level 3, honey thick); Puree; Solid; Pill 3.  Material enters airway, remains ABOVE vocal cords and not ejected out: Thin liquids (Level 0) (cleared with spontaneous subsequent swallow) Compensatory Strategies: Compensatory Strategies Compensatory strategies: No   General Information: Caregiver present: No  Diet Prior to this Study: Regular; Thin liquids (Level 0)   No data recorded  Respiratory Status: WFL   Supplemental O2: None (Room air)   History of Recent Intubation: No  Behavior/Cognition: Alert; Cooperative; Pleasant mood Self-Feeding Abilities: Able to self-feed Baseline vocal quality/speech: Normal Volitional Cough: Able to elicit Volitional Swallow: Able to elicit Exam Limitations: No limitations Goal Planning: No data recorded No data recorded No data recorded No data recorded Consulted and agree with results and recommendations: Patient Pain: Pain Assessment Pain Assessment: No/denies pain End of Session: Start Time:SLP Start Time (ACUTE ONLY): 1140 Stop Time: SLP Stop Time (ACUTE  ONLY): 1155 Time Calculation:SLP Time Calculation (min) (ACUTE ONLY): 15 min Charges: SLP Evaluations $ SLP Speech Visit: 1 Visit SLP Evaluations $Outpatient MBS Swallow: 1 Procedure SLP visit diagnosis: SLP Visit Diagnosis: Dysphagia, unspecified (R13.10) Past Medical History: Past Medical History: Diagnosis Date  AAA (abdominal aortic aneurysm)   Hepatitis   HLD (hyperlipidemia)   Hypertension   Kidney stones   1 removed by litho. 1 by Cysto  RLS (restless legs syndrome)   Stage 3 severe COPD by GOLD classification (HCC) 03/24/2024 Past Surgical History: Past Surgical History: Procedure Laterality Date  ANTERIOR LAT LUMBAR FUSION N/A 05/24/2024  Procedure: ANTERIOR LATERAL LUMBAR FUSION 2 LEVEL;  Surgeon: Georgina Ozell LABOR, MD;  Location: [facility] OR;  Service: Orthopedics;  Laterality: N/A;  Cervial  May 2012  4-5, 6-7  LUMBAR LAMINECTOMY N/A 05/15/2014  Procedure: MICRODISCECTOMY LUMBAR LAMINECTOMY;  Surgeon: Oneil JAYSON Herald, MD;  Location: [facility] OR;  Service: Orthopedics;  Laterality: N/A;  L3-4, L4-5 Decompression, Left L3-4, L4-5 Microdiscectomy  POSTERIOR CERVICAL FUSION/FORAMINOTOMY  12/19/2011  Procedure: POSTERIOR CERVICAL FUSION/FORAMINOTOMY LEVEL 2;  Surgeon: Oneil JAYSON Herald, MD;  Location: [facility] OR;  Service: Orthopedics;  Laterality: N/A;  Posterior Cervical Fusion, C4-5, C6-7 Wiring, Vitoss, Iliac aspirate  TONSILLECTOMY   Dustin Olam Bull 09/12/2024, 5:47 PM CLINICAL DATA:  Dysphagia; globus sensation. EXAM: MODIFIED BARIUM SWALLOW TECHNIQUE: Different consistencies of barium were administered orally to the patient by the Speech Pathologist. Imaging of the pharynx was performed in the lateral projection. Kacie Matthews PA-C was present in the fluoroscopy room during this study, which was supervised and interpreted by Newell Eke, MD. Radiologist, not in attendance for the exam. FLUOROSCOPY: Radiation Exposure Index (as provided by the fluoroscopic device): 21.43 mGy Kerma COMPARISON:  None Available. FINDINGS:  Vestibular Penetration observed with thin liquids once. This was cleared by subsequent swallow without further episodes noted. IMPRESSION: Please refer to the Speech Pathologists report for complete details and recommendations. Electronically Signed   By: Newell Eke M.D.   On: 09/12/2024 12:30        has a past medical history of AAA (abdominal aortic aneurysm), Hepatitis, HLD (hyperlipidemia), Hypertension, Kidney stones, RLS (restless legs syndrome), and Stage 3 severe COPD by GOLD classification (HCC) (03/24/2024).   reports that he has quit smoking. His smoking use included cigarettes. He has a 40 pack-year smoking history. He has never used smokeless tobacco.  Past Surgical History:  Procedure Laterality Date   ANTERIOR LAT LUMBAR FUSION N/A 05/24/2024   Procedure: ANTERIOR LATERAL LUMBAR FUSION 2 LEVEL;  Surgeon: Georgina Ozell LABOR, MD;  Location: [facility] OR;  Service: Orthopedics;  Laterality: N/A;   Cervial  May 2012   4-5, 6-7   LUMBAR LAMINECTOMY N/A 05/15/2014   Procedure: MICRODISCECTOMY LUMBAR LAMINECTOMY;  Surgeon: Oneil JAYSON Herald, MD;  Location: [facility] OR;  Service: Orthopedics;  Laterality: N/A;  L3-4, L4-5 Decompression, Left L3-4, L4-5 Microdiscectomy   POSTERIOR CERVICAL FUSION/FORAMINOTOMY  12/19/2011   Procedure: POSTERIOR CERVICAL FUSION/FORAMINOTOMY LEVEL 2;  Surgeon: Oneil JAYSON Herald, MD;  Location: [facility] OR;  Service: Orthopedics;  Laterality: N/A;  Posterior Cervical Fusion, C4-5, C6-7 Wiring, Vitoss, Iliac aspirate   TONSILLECTOMY      Allergies  Allergen Reactions   Fluticasone Furoate-Vilanterol     Sore throat, sore mouth    Immunization History  Administered Date(s) Administered   Fluad Quad(high Dose 65+) 07/04/2019   INFLUENZA, HIGH DOSE SEASONAL PF 07/10/2017, 07/07/2018   PFIZER(Purple Top)SARS-COV-2 Vaccination 12/25/2019, 01/24/2020    Family History  Problem Relation Age of Onset   Anesthesia problems Neg Hx      Current Outpatient Medications:     albuterol  (VENTOLIN  HFA) 108 (90 Base) MCG/ACT inhaler, Inhale 2 puffs into the lungs every 6 (six) hours as needed for wheezing or shortness of breath., Disp: 8 g, Rfl: 4   amLODipine  (NORVASC ) 5 MG tablet, Take 5 mg by mouth daily., Disp: , Rfl:    aspirin  81 MG chewable tablet, 1 tablet, Disp: , Rfl:    azithromycin (ZITHROMAX) 250 MG tablet, Five hundred milligrams azithromycin on day 1 followed by 250 mg daily azithromycin x 4 days for total 5 days, Disp: 6 tablet, Rfl: 0   budesonide -glycopyrrolate -formoterol  (BREZTRI  AEROSPHERE) 160-9-4.8 MCG/ACT AERO inhaler, Inhale 2 puffs into the lungs in the morning and at bedtime., Disp: 10.7 g, Rfl: 5   losartan  (COZAAR ) 100 MG tablet, Take 100 mg by mouth daily., Disp: , Rfl:    oxyCODONE -acetaminophen  (PERCOCET) 10-325 MG tablet, Take 1 tablet by mouth 4 (four) times daily as needed., Disp: , Rfl:    pravastatin  (PRAVACHOL ) 20 MG tablet, Take 20 mg by mouth daily., Disp: , Rfl:    albuterol  (PROVENTIL ) (2.5 MG/3ML) 0.083% nebulizer solution, Take 3 mLs (2.5 mg total) by nebulization every 6 (six) hours as needed for wheezing or shortness of breath., Disp: 75 mL, Rfl: 6   methylPREDNISolone  (MEDROL  DOSEPAK) 4 MG TBPK tablet, Take as prescribed on the box (Patient not taking: Reported on 09/15/2024), Disp: 21 tablet, Rfl: 0   rOPINIRole  (REQUIP ) 2 MG tablet, Take 2 mg by mouth every evening. Take 1-3 hours before bedtime (Patient not taking: Reported on 09/15/2024), Disp: , Rfl:       Objective:   Vitals:   09/15/24 1300  BP: 132/70  Pulse: 77  Temp: 98 F (36.7 C)  TempSrc: Oral  SpO2: 92%  Weight: 171 lb 3.2 oz (77.7 kg)  Height: 5' 7 (1.702 m)    Estimated body mass index is 26.81 kg/m as calculated from the following:   Height as of this encounter: 5' 7 (1.702 m).   Weight as of this encounter: 171 lb 3.2 oz (77.7 kg).  @WEIGHTCHANGE @  [employer]   09/15/24 1300  Weight: 171 lb 3.2 oz (77.7 kg)     Physical  Exam   General: No distress. Flat affect O2 at rest: no Cane present: no Sitting in wheel chair: no. HAS WALKER Frail: no Obese: no  Neuro: Alert and Oriented x 3. GCS 15. Speech normal Psych: Pleasant Resp:  Barrel Chest - no.  Wheeze -  YES, Crackles - no, No overt respiratory distress CVS: Normal heart sounds. Murmurs - no Ext: Stigmata of Connective Tissue Disease - no HEENT: Normal upper airway. PEERL +. No post nasal drip        Assessment/     Assessment & Plan COPD with acute exacerbation (HCC)  Stage 3 severe COPD by GOLD classification (HCC)  Nodule of lower lobe of right lung  Stopped smoking with greater than 40 pack year history    PLAN Patient Instructions  COPD with acute exacerbation (HCC) Stage 3 severe COPD by GOLD classification (HCC)  - you are wheezing and in flare up  - recognize that you really do not want to do steroid taper  Plan  - IM depot medrol  80mg  x 1 in office  - Z Pak  -contineu breztri  2 puff twice daily  - redo albuterol  as needed (both MDI and neb)  Nodule of lower lobe of right lung - April 2025 Stopped smoking with greater than 40 pack year history  Plan  Ct chest without contrast in April 2026   Followup - April 2026 after CT chest with app    FOLLOWUP    Return for - April 2026 after CT chest with app.    SIGNATURE    Dr. Dorethia Cave, M.D., F.C.C.P,  Pulmonary and Critical Care Medicine Staff Physician, [facility] Center Director - Interstitial Lung Disease  Program  Pulmonary Fibrosis [facility] at [facility] [place], [place], [address]  Pager: [phone], If no answer or between  15:00h - 7:00h: call [phone] Telephone: [phone]  1:23 PM 09/15/2024

## 2024-09-15 ENCOUNTER — Other Ambulatory Visit: Payer: Self-pay | Admitting: Internal Medicine

## 2024-09-15 ENCOUNTER — Ambulatory Visit (INDEPENDENT_AMBULATORY_CARE_PROVIDER_SITE_OTHER): Admitting: Internal Medicine

## 2024-09-15 ENCOUNTER — Encounter: Payer: Self-pay | Admitting: Internal Medicine

## 2024-09-15 VITALS — BP 132/70 | HR 77 | Temp 98.0°F | Ht 67.0 in | Wt 171.2 lb

## 2024-09-15 DIAGNOSIS — J441 Chronic obstructive pulmonary disease with (acute) exacerbation: Secondary | ICD-10-CM | POA: Diagnosis not present

## 2024-09-15 DIAGNOSIS — R911 Solitary pulmonary nodule: Secondary | ICD-10-CM

## 2024-09-15 DIAGNOSIS — Z87891 Personal history of nicotine dependence: Secondary | ICD-10-CM

## 2024-09-15 DIAGNOSIS — J449 Chronic obstructive pulmonary disease, unspecified: Secondary | ICD-10-CM

## 2024-09-15 MED ORDER — METHYLPREDNISOLONE ACETATE 80 MG/ML IJ SUSP
80.0000 mg | Freq: Once | INTRAMUSCULAR | Status: AC
  Administered 2024-09-15: 80 mg via INTRAMUSCULAR

## 2024-09-15 MED ORDER — AZITHROMYCIN 250 MG PO TABS: ORAL_TABLET | ORAL | 0 refills | Status: AC

## 2024-09-15 MED ORDER — ALBUTEROL SULFATE HFA 108 (90 BASE) MCG/ACT IN AERS: 2.0000 | INHALATION_SPRAY | Freq: Four times a day (QID) | RESPIRATORY_TRACT | 4 refills | Status: AC | PRN

## 2024-09-15 MED ORDER — ALBUTEROL SULFATE (2.5 MG/3ML) 0.083% IN NEBU: 2.5000 mg | INHALATION_SOLUTION | Freq: Four times a day (QID) | RESPIRATORY_TRACT | 6 refills | Status: DC | PRN

## 2024-09-15 NOTE — Addendum Note (Signed)
 Addended by: Bristol Osentoski T on: 09/15/2024 01:51 PM   Modules accepted: Orders

## 2024-09-15 NOTE — Patient Instructions (Addendum)
 COPD with acute exacerbation (HCC) Stage 3 severe COPD by GOLD classification (HCC)  - you are wheezing and in flare up  - recognize that you really do not want to do steroid taper  Plan  - IM depot medrol  80mg  x 1 in office  - Z Pak  -contineu breztri  2 puff twice daily  - redo albuterol  as needed (both MDI and neb)  Nodule of lower lobe of right lung - April 2025 Stopped smoking with greater than 40 pack year history  Plan  Ct chest without contrast in April 2026   Followup - April 2026 after CT chest with app

## 2024-09-19 ENCOUNTER — Encounter (HOSPITAL_COMMUNITY): Payer: Self-pay

## 2024-09-25 DIAGNOSIS — I10 Essential (primary) hypertension: Secondary | ICD-10-CM | POA: Diagnosis not present

## 2024-09-25 DIAGNOSIS — J440 Chronic obstructive pulmonary disease with acute lower respiratory infection: Secondary | ICD-10-CM | POA: Diagnosis not present

## 2024-09-26 DIAGNOSIS — Z6826 Body mass index (BMI) 26.0-26.9, adult: Secondary | ICD-10-CM | POA: Diagnosis not present

## 2024-09-26 DIAGNOSIS — M4727 Other spondylosis with radiculopathy, lumbosacral region: Secondary | ICD-10-CM | POA: Diagnosis not present

## 2024-09-26 DIAGNOSIS — M48061 Spinal stenosis, lumbar region without neurogenic claudication: Secondary | ICD-10-CM | POA: Diagnosis not present

## 2024-09-26 DIAGNOSIS — I10 Essential (primary) hypertension: Secondary | ICD-10-CM | POA: Diagnosis not present

## 2024-10-03 DIAGNOSIS — E785 Hyperlipidemia, unspecified: Secondary | ICD-10-CM | POA: Diagnosis not present

## 2024-10-03 DIAGNOSIS — I10 Essential (primary) hypertension: Secondary | ICD-10-CM | POA: Diagnosis not present

## 2024-10-03 DIAGNOSIS — Z125 Encounter for screening for malignant neoplasm of prostate: Secondary | ICD-10-CM | POA: Diagnosis not present

## 2024-10-17 ENCOUNTER — Other Ambulatory Visit: Payer: Self-pay | Admitting: Primary Care

## 2024-11-08 ENCOUNTER — Ambulatory Visit (HOSPITAL_COMMUNITY)
Admission: RE | Admit: 2024-11-08 | Discharge: 2024-11-08 | Disposition: A | Discharge status: Home / Self Care | Source: Ambulatory Visit | Attending: Cardiovascular Disease | Admitting: Cardiovascular Disease

## 2024-11-08 DIAGNOSIS — I7143 Infrarenal abdominal aortic aneurysm, without rupture: Secondary | ICD-10-CM | POA: Insufficient documentation

## 2024-11-09 ENCOUNTER — Ambulatory Visit: Payer: Self-pay | Admitting: Cardiovascular Disease

## 2024-11-09 DIAGNOSIS — I7143 Infrarenal abdominal aortic aneurysm, without rupture: Secondary | ICD-10-CM

## 2025-01-05 ENCOUNTER — Ambulatory Visit: Admitting: Orthopedic Surgery
